# Patient Record
Sex: Female | Born: 1940 | Race: Black or African American | Hispanic: No | Smoking: Former smoker
Health system: Southern US, Community
[De-identification: ages and names within clinical notes are randomized; demographics above are authoritative.]

## PROBLEM LIST (undated history)

## (undated) DIAGNOSIS — Z8249 Family history of ischemic heart disease and other diseases of the circulatory system: Secondary | ICD-10-CM

## (undated) DIAGNOSIS — C50912 Malignant neoplasm of unspecified site of left female breast: Secondary | ICD-10-CM

## (undated) DIAGNOSIS — K219 Gastro-esophageal reflux disease without esophagitis: Secondary | ICD-10-CM

## (undated) DIAGNOSIS — I10 Essential (primary) hypertension: Secondary | ICD-10-CM

## (undated) DIAGNOSIS — Z853 Personal history of malignant neoplasm of breast: Secondary | ICD-10-CM

## (undated) DIAGNOSIS — C50919 Malignant neoplasm of unspecified site of unspecified female breast: Secondary | ICD-10-CM

## (undated) DIAGNOSIS — I509 Heart failure, unspecified: Secondary | ICD-10-CM

## (undated) HISTORY — DX: Family history of ischemic heart disease and other diseases of the circulatory system: Z82.49

## (undated) HISTORY — DX: Malignant neoplasm of unspecified site of left female breast: C50.912

## (undated) HISTORY — DX: Gastro-esophageal reflux disease without esophagitis: K21.9

## (undated) HISTORY — DX: Malignant neoplasm of unspecified site of unspecified female breast: C50.919

## (undated) HISTORY — DX: Essential (primary) hypertension: I10

## (undated) HISTORY — DX: Personal history of malignant neoplasm of breast: Z85.3

## (undated) HISTORY — DX: Heart failure, unspecified: I50.9

---

## 1969-07-20 HISTORY — PX: TOTAL ABDOMINAL HYSTERECTOMY W/ BILATERAL SALPINGOOPHORECTOMY: SHX83

## 1969-07-20 HISTORY — PX: VESICOVAGINAL FISTULA CLOSURE W/ TAH: SUR271

## 1971-07-21 HISTORY — PX: ABDOMINAL HYSTERECTOMY: SHX81

## 1995-07-21 HISTORY — PX: MASTECTOMY: SHX3

## 1996-07-20 DIAGNOSIS — I10 Essential (primary) hypertension: Secondary | ICD-10-CM

## 1996-07-20 DIAGNOSIS — C50919 Malignant neoplasm of unspecified site of unspecified female breast: Secondary | ICD-10-CM

## 1996-07-20 HISTORY — DX: Malignant neoplasm of unspecified site of unspecified female breast: C50.919

## 1996-07-20 HISTORY — DX: Essential (primary) hypertension: I10

## 1996-07-20 HISTORY — PX: BREAST SURGERY: SHX581

## 2000-03-20 LAB — HM COLONOSCOPY: HM Colonoscopy: ABNORMAL

## 2001-01-14 ENCOUNTER — Encounter: Payer: Self-pay | Admitting: Family Medicine

## 2001-01-14 ENCOUNTER — Ambulatory Visit (HOSPITAL_COMMUNITY): Admission: RE | Admit: 2001-01-14 | Discharge: 2001-01-14 | Payer: Self-pay | Admitting: Family Medicine

## 2001-09-27 ENCOUNTER — Encounter (HOSPITAL_COMMUNITY): Admission: RE | Admit: 2001-09-27 | Discharge: 2001-10-27 | Payer: Self-pay | Admitting: Oncology

## 2001-09-28 ENCOUNTER — Ambulatory Visit (HOSPITAL_COMMUNITY): Admission: RE | Admit: 2001-09-28 | Discharge: 2001-09-28 | Payer: Self-pay | Admitting: Family Medicine

## 2001-09-28 ENCOUNTER — Encounter: Payer: Self-pay | Admitting: Family Medicine

## 2002-12-25 ENCOUNTER — Encounter: Payer: Self-pay | Admitting: Family Medicine

## 2002-12-25 ENCOUNTER — Ambulatory Visit (HOSPITAL_COMMUNITY): Admission: RE | Admit: 2002-12-25 | Discharge: 2002-12-25 | Payer: Self-pay | Admitting: Family Medicine

## 2003-01-17 ENCOUNTER — Other Ambulatory Visit: Admission: RE | Admit: 2003-01-17 | Discharge: 2003-01-17 | Payer: Self-pay | Admitting: General Surgery

## 2003-10-08 ENCOUNTER — Ambulatory Visit (HOSPITAL_COMMUNITY): Admission: RE | Admit: 2003-10-08 | Discharge: 2003-10-08 | Payer: Self-pay | Admitting: Family Medicine

## 2004-02-07 ENCOUNTER — Encounter (HOSPITAL_COMMUNITY): Admission: RE | Admit: 2004-02-07 | Discharge: 2004-03-08 | Payer: Self-pay | Admitting: Oncology

## 2004-03-20 ENCOUNTER — Encounter: Payer: Self-pay | Admitting: Family Medicine

## 2004-03-20 LAB — CONVERTED CEMR LAB: Pap Smear: NORMAL

## 2004-03-28 ENCOUNTER — Encounter (HOSPITAL_COMMUNITY): Admission: RE | Admit: 2004-03-28 | Discharge: 2004-04-18 | Payer: Self-pay | Admitting: Oncology

## 2004-03-28 ENCOUNTER — Encounter: Admission: RE | Admit: 2004-03-28 | Discharge: 2004-04-18 | Payer: Self-pay | Admitting: Oncology

## 2004-05-22 ENCOUNTER — Emergency Department (HOSPITAL_COMMUNITY): Admission: EM | Admit: 2004-05-22 | Discharge: 2004-05-22 | Payer: Self-pay | Admitting: Emergency Medicine

## 2004-05-28 ENCOUNTER — Ambulatory Visit: Payer: Self-pay | Admitting: Family Medicine

## 2004-05-30 ENCOUNTER — Ambulatory Visit (HOSPITAL_COMMUNITY): Admission: RE | Admit: 2004-05-30 | Discharge: 2004-05-30 | Payer: Self-pay | Admitting: Family Medicine

## 2005-02-19 ENCOUNTER — Inpatient Hospital Stay: Payer: Self-pay | Admitting: Internal Medicine

## 2005-03-18 ENCOUNTER — Ambulatory Visit: Payer: Self-pay | Admitting: Family Medicine

## 2005-03-26 ENCOUNTER — Encounter (HOSPITAL_COMMUNITY): Admission: RE | Admit: 2005-03-26 | Discharge: 2005-04-17 | Payer: Self-pay | Admitting: Oncology

## 2005-03-26 ENCOUNTER — Encounter: Admission: RE | Admit: 2005-03-26 | Discharge: 2005-04-17 | Payer: Self-pay | Admitting: Oncology

## 2005-03-30 ENCOUNTER — Ambulatory Visit (HOSPITAL_COMMUNITY): Payer: Self-pay | Admitting: Oncology

## 2005-04-08 ENCOUNTER — Ambulatory Visit: Payer: Self-pay | Admitting: Family Medicine

## 2005-04-13 ENCOUNTER — Ambulatory Visit (HOSPITAL_COMMUNITY): Admission: RE | Admit: 2005-04-13 | Discharge: 2005-04-13 | Payer: Self-pay | Admitting: Family Medicine

## 2005-05-07 ENCOUNTER — Ambulatory Visit: Payer: Self-pay | Admitting: Internal Medicine

## 2005-05-12 ENCOUNTER — Encounter (HOSPITAL_COMMUNITY): Admission: RE | Admit: 2005-05-12 | Discharge: 2005-06-11 | Payer: Self-pay | Admitting: *Deleted

## 2005-08-27 ENCOUNTER — Ambulatory Visit: Payer: Self-pay | Admitting: Family Medicine

## 2005-10-01 ENCOUNTER — Ambulatory Visit: Payer: Self-pay | Admitting: Family Medicine

## 2006-02-09 ENCOUNTER — Ambulatory Visit: Payer: Self-pay | Admitting: Family Medicine

## 2006-04-20 ENCOUNTER — Encounter: Admission: RE | Admit: 2006-04-20 | Discharge: 2006-04-20 | Payer: Self-pay | Admitting: Oncology

## 2006-04-20 ENCOUNTER — Encounter (HOSPITAL_COMMUNITY): Admission: RE | Admit: 2006-04-20 | Discharge: 2006-05-20 | Payer: Self-pay | Admitting: Oncology

## 2006-05-06 ENCOUNTER — Ambulatory Visit: Payer: Self-pay | Admitting: Family Medicine

## 2006-09-21 ENCOUNTER — Ambulatory Visit: Payer: Self-pay | Admitting: Family Medicine

## 2006-10-13 ENCOUNTER — Ambulatory Visit (HOSPITAL_COMMUNITY): Admission: RE | Admit: 2006-10-13 | Discharge: 2006-10-13 | Payer: Self-pay | Admitting: Family Medicine

## 2006-10-13 ENCOUNTER — Encounter: Payer: Self-pay | Admitting: Family Medicine

## 2006-10-13 LAB — CONVERTED CEMR LAB
ALT: 10 units/L (ref 0–35)
AST: 14 units/L (ref 0–37)
Albumin: 4 g/dL (ref 3.5–5.2)
Alkaline Phosphatase: 82 units/L (ref 39–117)
BUN: 19 mg/dL (ref 6–23)
Basophils Absolute: 0 10*3/uL (ref 0.0–0.1)
Basophils Relative: 0 % (ref 0–1)
Bilirubin, Direct: <0.1 mg/dL (ref 0.0–0.3)
CO2: 23 meq/L (ref 19–32)
Calcium: 9 mg/dL (ref 8.4–10.5)
Chloride: 102 meq/L (ref 96–112)
Cholesterol: 189 mg/dL (ref 0–200)
Creatinine, Ser: 0.93 mg/dL (ref 0.40–1.20)
Eosinophils Absolute: 0.2 10*3/uL (ref 0.0–0.7)
Eosinophils Relative: 2 % (ref 0–5)
Glucose, Bld: 68 mg/dL — ABNORMAL LOW (ref 70–99)
HCT: 41.3 % (ref 36.0–46.0)
HDL: 44 mg/dL (ref >39)
Hemoglobin: 12.9 g/dL (ref 12.0–15.0)
LDL Cholesterol: 129 mg/dL — ABNORMAL HIGH (ref 0–99)
Lymphocytes Relative: 33 % (ref 12–46)
Lymphs Abs: 2.7 10*3/uL (ref 0.7–3.3)
MCHC: 31.2 g/dL (ref 30.0–36.0)
MCV: 93.2 fL (ref 78.0–100.0)
Monocytes Absolute: 0.5 10*3/uL (ref 0.2–0.7)
Monocytes Relative: 6 % (ref 3–11)
Neutro Abs: 4.7 10*3/uL (ref 1.7–7.7)
Neutrophils Relative %: 58 % (ref 43–77)
Platelets: 383 10*3/uL (ref 150–400)
Potassium: 4.1 meq/L (ref 3.5–5.3)
RBC: 4.43 M/uL (ref 3.87–5.11)
RDW: 14.3 % — ABNORMAL HIGH (ref 11.5–14.0)
Sodium: 141 meq/L (ref 135–145)
TSH: 4.108 microintl units/mL (ref 0.350–5.50)
Total Bilirubin: 0.4 mg/dL (ref 0.3–1.2)
Total CHOL/HDL Ratio: 4.3
Total Protein: 7.6 g/dL (ref 6.0–8.3)
Triglycerides: 80 mg/dL (ref <150)
VLDL: 16 mg/dL (ref 0–40)
WBC: 8.2 10*3/uL (ref 4.0–10.5)

## 2006-10-27 ENCOUNTER — Ambulatory Visit: Payer: Self-pay | Admitting: Family Medicine

## 2007-02-14 ENCOUNTER — Ambulatory Visit: Payer: Self-pay | Admitting: Family Medicine

## 2007-02-15 ENCOUNTER — Encounter: Payer: Self-pay | Admitting: Family Medicine

## 2007-04-18 ENCOUNTER — Ambulatory Visit: Payer: Self-pay | Admitting: Family Medicine

## 2007-04-25 ENCOUNTER — Ambulatory Visit (HOSPITAL_COMMUNITY): Admission: RE | Admit: 2007-04-25 | Discharge: 2007-04-25 | Payer: Self-pay | Admitting: Family Medicine

## 2007-04-27 ENCOUNTER — Ambulatory Visit (HOSPITAL_COMMUNITY): Payer: Self-pay | Admitting: Oncology

## 2007-05-25 ENCOUNTER — Ambulatory Visit: Payer: Self-pay | Admitting: Family Medicine

## 2007-05-25 LAB — CONVERTED CEMR LAB
BUN: 18 mg/dL (ref 6–23)
CO2: 27 meq/L (ref 19–32)
Calcium: 9 mg/dL (ref 8.4–10.5)
Chloride: 104 meq/L (ref 96–112)
Cholesterol: 189 mg/dL (ref 0–200)
Creatinine, Ser: 0.93 mg/dL (ref 0.40–1.20)
Glucose, Bld: 90 mg/dL (ref 70–99)
HDL: 42 mg/dL (ref >39)
LDL Cholesterol: 134 mg/dL — ABNORMAL HIGH (ref 0–99)
Potassium: 3.9 meq/L (ref 3.5–5.3)
Sodium: 141 meq/L (ref 135–145)
Total CHOL/HDL Ratio: 4.5
Triglycerides: 67 mg/dL (ref <150)
VLDL: 13 mg/dL (ref 0–40)

## 2007-07-29 ENCOUNTER — Encounter: Payer: Self-pay | Admitting: Family Medicine

## 2007-07-29 DIAGNOSIS — I10 Essential (primary) hypertension: Secondary | ICD-10-CM | POA: Insufficient documentation

## 2007-07-29 DIAGNOSIS — J45909 Unspecified asthma, uncomplicated: Secondary | ICD-10-CM | POA: Insufficient documentation

## 2007-07-29 DIAGNOSIS — I1A0 Resistant hypertension: Secondary | ICD-10-CM | POA: Insufficient documentation

## 2007-07-29 DIAGNOSIS — C50912 Malignant neoplasm of unspecified site of left female breast: Secondary | ICD-10-CM

## 2007-07-29 DIAGNOSIS — I509 Heart failure, unspecified: Secondary | ICD-10-CM | POA: Insufficient documentation

## 2007-07-29 DIAGNOSIS — K219 Gastro-esophageal reflux disease without esophagitis: Secondary | ICD-10-CM | POA: Insufficient documentation

## 2007-07-29 HISTORY — DX: Malignant neoplasm of unspecified site of left female breast: C50.912

## 2007-10-06 ENCOUNTER — Ambulatory Visit: Payer: Self-pay | Admitting: Family Medicine

## 2007-11-17 ENCOUNTER — Other Ambulatory Visit: Admission: RE | Admit: 2007-11-17 | Discharge: 2007-11-17 | Payer: Self-pay | Admitting: Family Medicine

## 2007-11-17 ENCOUNTER — Ambulatory Visit: Payer: Self-pay | Admitting: Family Medicine

## 2007-11-17 ENCOUNTER — Encounter: Payer: Self-pay | Admitting: Family Medicine

## 2007-11-17 LAB — CONVERTED CEMR LAB
BUN: 15 mg/dL (ref 6–23)
CO2: 28 meq/L (ref 19–32)
Calcium: 9.1 mg/dL (ref 8.4–10.5)
Chloride: 100 meq/L (ref 96–112)
Cholesterol: 182 mg/dL (ref 0–200)
Creatinine, Ser: 0.97 mg/dL (ref 0.40–1.20)
Glucose, Bld: 86 mg/dL (ref 70–99)
HDL: 44 mg/dL (ref >39)
LDL Cholesterol: 122 mg/dL — ABNORMAL HIGH (ref 0–99)
Potassium: 3.9 meq/L (ref 3.5–5.3)
Sodium: 139 meq/L (ref 135–145)
Total CHOL/HDL Ratio: 4.1
Triglycerides: 80 mg/dL (ref <150)
VLDL: 16 mg/dL (ref 0–40)

## 2007-11-18 ENCOUNTER — Encounter: Payer: Self-pay | Admitting: Family Medicine

## 2007-12-29 ENCOUNTER — Ambulatory Visit: Payer: Self-pay | Admitting: Family Medicine

## 2008-02-09 ENCOUNTER — Ambulatory Visit: Payer: Self-pay | Admitting: Family Medicine

## 2008-02-20 ENCOUNTER — Encounter: Payer: Self-pay | Admitting: Family Medicine

## 2008-04-26 ENCOUNTER — Ambulatory Visit (HOSPITAL_COMMUNITY): Admission: RE | Admit: 2008-04-26 | Discharge: 2008-04-26 | Payer: Self-pay | Admitting: Family Medicine

## 2008-05-17 ENCOUNTER — Ambulatory Visit: Payer: Self-pay | Admitting: Family Medicine

## 2008-06-07 ENCOUNTER — Telehealth: Payer: Self-pay | Admitting: Family Medicine

## 2008-06-29 ENCOUNTER — Ambulatory Visit (HOSPITAL_COMMUNITY): Payer: Self-pay | Admitting: Oncology

## 2008-06-29 ENCOUNTER — Encounter: Payer: Self-pay | Admitting: Family Medicine

## 2008-09-20 ENCOUNTER — Ambulatory Visit: Payer: Self-pay | Admitting: Family Medicine

## 2008-09-20 DIAGNOSIS — R5381 Other malaise: Secondary | ICD-10-CM

## 2008-09-20 DIAGNOSIS — R5383 Other fatigue: Secondary | ICD-10-CM

## 2008-09-20 HISTORY — DX: Other malaise: R53.81

## 2008-09-21 ENCOUNTER — Encounter: Payer: Self-pay | Admitting: Family Medicine

## 2008-09-25 ENCOUNTER — Encounter: Payer: Self-pay | Admitting: Family Medicine

## 2008-09-26 ENCOUNTER — Encounter: Payer: Self-pay | Admitting: Family Medicine

## 2008-09-27 LAB — CONVERTED CEMR LAB
BUN: 20 mg/dL (ref 6–23)
Band Neutrophils: 0 % (ref 0–10)
Basophils Absolute: 0 10*3/uL (ref 0.0–0.1)
Basophils Relative: 0 % (ref 0–1)
CO2: 27 meq/L (ref 19–32)
Calcium: 9.1 mg/dL (ref 8.4–10.5)
Chloride: 102 meq/L (ref 96–112)
Cholesterol: 193 mg/dL (ref 0–200)
Creatinine, Ser: 0.9 mg/dL (ref 0.40–1.20)
Eosinophils Absolute: 0.1 10*3/uL (ref 0.0–0.7)
Eosinophils Relative: 1 % (ref 0–5)
Glucose, Bld: 81 mg/dL (ref 70–99)
HCT: 39.1 % (ref 36.0–46.0)
HDL: 46 mg/dL (ref >39)
Hemoglobin: 12.2 g/dL (ref 12.0–15.0)
LDL Cholesterol: 134 mg/dL — ABNORMAL HIGH (ref 0–99)
Lymphocytes Relative: 27 % (ref 12–46)
Lymphs Abs: 2.4 10*3/uL (ref 0.7–4.0)
MCHC: 31.2 g/dL (ref 30.0–36.0)
MCV: 94.4 fL (ref 78.0–100.0)
Monocytes Absolute: 0.5 10*3/uL (ref 0.1–1.0)
Monocytes Relative: 5 % (ref 3–12)
Neutro Abs: 5.9 10*3/uL (ref 1.7–7.7)
Neutrophils Relative %: 67 % (ref 43–77)
Platelets: 381 10*3/uL (ref 150–400)
Potassium: 4.2 meq/L (ref 3.5–5.3)
RBC: 4.14 M/uL (ref 3.87–5.11)
RDW: 15.1 % (ref 11.5–15.5)
Sodium: 142 meq/L (ref 135–145)
TSH: 2.729 microintl units/mL (ref 0.350–4.500)
Total CHOL/HDL Ratio: 4.2
Triglycerides: 63 mg/dL (ref <150)
VLDL: 13 mg/dL (ref 0–40)
WBC: 8.8 10*3/uL (ref 4.0–10.5)

## 2008-10-22 ENCOUNTER — Encounter: Payer: Self-pay | Admitting: Family Medicine

## 2008-11-22 ENCOUNTER — Ambulatory Visit: Payer: Self-pay | Admitting: Family Medicine

## 2009-04-18 ENCOUNTER — Ambulatory Visit: Payer: Self-pay | Admitting: Family Medicine

## 2009-04-29 ENCOUNTER — Ambulatory Visit (HOSPITAL_COMMUNITY): Admission: RE | Admit: 2009-04-29 | Discharge: 2009-04-29 | Payer: Self-pay | Admitting: Oncology

## 2009-04-29 ENCOUNTER — Encounter: Payer: Self-pay | Admitting: Family Medicine

## 2009-05-06 ENCOUNTER — Ambulatory Visit: Payer: Self-pay | Admitting: Family Medicine

## 2009-05-06 ENCOUNTER — Other Ambulatory Visit: Admission: RE | Admit: 2009-05-06 | Discharge: 2009-05-06 | Payer: Self-pay | Admitting: Family Medicine

## 2009-05-06 ENCOUNTER — Encounter: Payer: Self-pay | Admitting: Family Medicine

## 2009-05-06 LAB — CONVERTED CEMR LAB: Creatinine, Ser: 0.99 mg/dL (ref 0.40–1.20)

## 2009-05-23 ENCOUNTER — Telehealth: Payer: Self-pay | Admitting: Family Medicine

## 2009-08-19 ENCOUNTER — Ambulatory Visit (HOSPITAL_COMMUNITY): Payer: Self-pay | Admitting: Oncology

## 2009-08-19 ENCOUNTER — Encounter: Payer: Self-pay | Admitting: Family Medicine

## 2009-10-21 ENCOUNTER — Ambulatory Visit: Payer: Self-pay | Admitting: Family Medicine

## 2009-10-23 ENCOUNTER — Encounter: Payer: Self-pay | Admitting: Family Medicine

## 2009-10-28 ENCOUNTER — Encounter: Payer: Self-pay | Admitting: Family Medicine

## 2009-10-28 LAB — CONVERTED CEMR LAB
BUN: 16 mg/dL (ref 6–23)
Basophils Absolute: 0 10*3/uL (ref 0.0–0.1)
Basophils Relative: 0 % (ref 0–1)
CO2: 27 meq/L (ref 19–32)
Calcium: 9.1 mg/dL (ref 8.4–10.5)
Chloride: 101 meq/L (ref 96–112)
Cholesterol: 185 mg/dL (ref 0–200)
Creatinine, Ser: 0.95 mg/dL (ref 0.40–1.20)
Eosinophils Absolute: 0.2 10*3/uL (ref 0.0–0.7)
Eosinophils Relative: 2 % (ref 0–5)
Glucose, Bld: 86 mg/dL (ref 70–99)
HCT: 39.1 % (ref 36.0–46.0)
HDL: 45 mg/dL (ref >39)
Hemoglobin: 12.3 g/dL (ref 12.0–15.0)
LDL Cholesterol: 123 mg/dL — ABNORMAL HIGH (ref 0–99)
Lymphocytes Relative: 35 % (ref 12–46)
Lymphs Abs: 3.2 10*3/uL (ref 0.7–4.0)
MCHC: 31.5 g/dL (ref 30.0–36.0)
MCV: 93.1 fL (ref 78.0–100.0)
Monocytes Absolute: 0.6 10*3/uL (ref 0.1–1.0)
Monocytes Relative: 6 % (ref 3–12)
Neutro Abs: 5.3 10*3/uL (ref 1.7–7.7)
Neutrophils Relative %: 57 % (ref 43–77)
Platelets: 382 10*3/uL (ref 150–400)
Potassium: 3.8 meq/L (ref 3.5–5.3)
RBC: 4.2 M/uL (ref 3.87–5.11)
RDW: 14.9 % (ref 11.5–15.5)
Sodium: 140 meq/L (ref 135–145)
TSH: 4.412 microintl units/mL (ref 0.350–4.500)
Total CHOL/HDL Ratio: 4.1
Triglycerides: 87 mg/dL (ref <150)
VLDL: 17 mg/dL (ref 0–40)
Vit D, 25-Hydroxy: 10 ng/mL — ABNORMAL LOW (ref 30–89)
WBC: 9.3 10*3/uL (ref 4.0–10.5)

## 2009-11-12 ENCOUNTER — Encounter: Payer: Self-pay | Admitting: Gastroenterology

## 2009-12-09 ENCOUNTER — Emergency Department (HOSPITAL_COMMUNITY): Admission: EM | Admit: 2009-12-09 | Discharge: 2009-12-09 | Payer: Self-pay | Admitting: Emergency Medicine

## 2010-03-27 ENCOUNTER — Ambulatory Visit: Payer: Self-pay | Admitting: Family Medicine

## 2010-04-07 ENCOUNTER — Encounter (INDEPENDENT_AMBULATORY_CARE_PROVIDER_SITE_OTHER): Payer: Self-pay

## 2010-04-17 ENCOUNTER — Telehealth (INDEPENDENT_AMBULATORY_CARE_PROVIDER_SITE_OTHER): Payer: Self-pay

## 2010-04-22 ENCOUNTER — Encounter: Payer: Self-pay | Admitting: Gastroenterology

## 2010-05-01 ENCOUNTER — Ambulatory Visit (HOSPITAL_COMMUNITY): Admission: RE | Admit: 2010-05-01 | Discharge: 2010-05-01 | Payer: Self-pay | Admitting: Family Medicine

## 2010-05-20 LAB — CONVERTED CEMR LAB
BUN: 21 mg/dL (ref 6–23)
CO2: 31 meq/L (ref 19–32)
Calcium: 9.1 mg/dL (ref 8.4–10.5)
Chloride: 102 meq/L (ref 96–112)
Cholesterol: 198 mg/dL (ref 0–200)
Creatinine, Ser: 0.91 mg/dL (ref 0.40–1.20)
Glucose, Bld: 100 mg/dL — ABNORMAL HIGH (ref 70–99)
HDL: 46 mg/dL (ref >39)
LDL Cholesterol: 136 mg/dL — ABNORMAL HIGH (ref 0–99)
Potassium: 3.9 meq/L (ref 3.5–5.3)
Sodium: 140 meq/L (ref 135–145)
Total CHOL/HDL Ratio: 4.3
Triglycerides: 82 mg/dL (ref <150)
VLDL: 16 mg/dL (ref 0–40)
Vit D, 25-Hydroxy: 37 ng/mL (ref 30–89)

## 2010-05-24 ENCOUNTER — Observation Stay: Payer: Self-pay | Admitting: Internal Medicine

## 2010-07-09 ENCOUNTER — Encounter: Payer: Self-pay | Admitting: Family Medicine

## 2010-07-09 ENCOUNTER — Encounter (HOSPITAL_COMMUNITY)
Admission: RE | Admit: 2010-07-09 | Discharge: 2010-08-08 | Payer: Self-pay | Source: Home / Self Care | Attending: Oncology | Admitting: Oncology

## 2010-07-09 ENCOUNTER — Ambulatory Visit (HOSPITAL_COMMUNITY): Payer: Self-pay | Admitting: Oncology

## 2010-08-10 ENCOUNTER — Encounter: Payer: Self-pay | Admitting: Family Medicine

## 2010-08-19 NOTE — Assessment & Plan Note (Signed)
Summary: physical   Vital Signs:  Patient profile:   70 year old female Menstrual status:  hysterectomy Height:      63 inches Weight:      244.50 pounds BMI:     43.47 O2 Sat:      93 % Pulse rate:   59 / minute Pulse rhythm:   regular Resp:     16 per minute BP sitting:   120 / 80  (left arm) Cuff size:   xl  Vitals Entered By: Everitt Amber (May 06, 2009 10:38 AM)  Nutrition Counseling: Patient's BMI is greater than 25 and therefore counseled on weight management options. CC: CPE Is Patient Diabetic? No Pain Assessment Patient in pain? no        CC:  CPE.  History of Present Illness: Reports  that she has been doing well. Denies recent fever or chills. Denies sinus pressure, nasal congestion , ear pain or sore throat. Denies chest congestion, or cough productive of sputum. Denies chest pain, palpitations, PND, orthopnea or leg swelling. Denies abdominal pain, nausea, vomitting, diarrhea or constipation. Denies change in bowel movements or bloody stool. Denies dysuria , frequency, incontinence or hesitancy. Reports  joint pain, and  reduced mobility.primarily of the spine Denies headaches, vertigo, seizures. Denies depression, anxiety or insomnia. Denies  rash, lesions, or itch.     Current Medications (verified): 1)  Hyzaar 100-25 Mg  Tabs (Losartan Potassium-Hctz) .... Once Daily 2)  Potassium Chloride 20 Meq  Pack (Potassium Chloride) .... Once Daily 3)  Multivitamins  Tabs (Multiple Vitamin) .... Take One Tab By Mouth Once Daily  Allergies (verified): 1)  ! Asa  Review of Systems      See HPI Neuro:  Denies headaches, poor balance, seizures, and sensation of room spinning. Endo:  Denies cold intolerance, excessive hunger, excessive thirst, excessive urination, heat intolerance, polyuria, and weight change. Heme:  Denies abnormal bruising and bleeding. Allergy:  Complains of seasonal allergies; mild symptoms.  Physical Exam  General:  alert,  well-hydrated, and overweight-appearing.   Head:  Normocephalic and atraumatic without obvious abnormalities. No apparent alopecia or balding. Eyes:  No corneal or conjunctival inflammation noted. EOMI. Perrla. Funduscopic exam benign, without hemorrhages, exudates or papilledema. Vision grossly normal. Ears:  External ear exam shows no significant lesions or deformities.  Otoscopic examination reveals clear canals, tympanic membranes are intact bilaterally without bulging, retraction, inflammation or discharge. Hearing is grossly normal bilaterally. Nose:  External nasal examination shows no deformity or inflammation. Nasal mucosa are pink and moist without lesions or exudates. Mouth:  pharynx pink and moist and teeth missing.   Neck:  No deformities, masses, or tenderness noted. Chest Wall:  No deformities, masses, or tenderness noted. Breasts:  left mastectomy, right breast negative for mass, no axillary orsupraclavicular adenopathy Lungs:  Normal respiratory effort, chest expands symmetrically. Lungs are clear to auscultation, no crackles or wheezes. Heart:  Normal rate and regular rhythm. S1 and S2 normal without gallop, murmur, click, rub or other extra sounds. Abdomen:  Bowel sounds positive,abdomen soft and non-tender without masses, organomegaly or hernias noted. Rectal:  No external abnormalities noted. Normal sphincter tone. No rectal masses or tenderness. Genitalia:  normal introitus, no vaginal discharge, no vaginal atrophy, and no adnexal masses or tenderness.  terus is absent Msk:  No deformity or scoliosis noted of thoracic or lumbar spine.   Pulses:  R and L carotid,radial,femoral,dorsalis pedis and posterior tibial pulses are full and equal bilaterally Extremities:  decreased ROM thoracolumbar spine  adequate in hips , decreased in knees Neurologic:  No cranial nerve deficits noted. Station and gait are normal. Plantar reflexes are down-going bilaterally. DTRs are symmetrical  throughout. Sensory, motor and coordinative functions appear intact. Skin:  Intact without suspicious lesions or rashes Cervical Nodes:  No lymphadenopathy noted Axillary Nodes:  No palpable lymphadenopathy Inguinal Nodes:  No significant adenopathy Psych:  Cognition and judgment appear intact. Alert and cooperative with normal attention span and concentration. No apparent delusions, illusions, hallucinations   Impression & Recommendations:  Problem # 1:  PHYSICAL EXAMINATION (ICD-V70.0) Assessment Comment Only pap sent, stool is guaic negative  Problem # 2:  MORBID OBESITY (ICD-278.01) Assessment: Improved  Ht: 63 (05/06/2009)   Wt: 244.50 (05/06/2009)   BMI: 43.47 (05/06/2009)  Problem # 3:  HYPERTENSION (ICD-401.9) Assessment: Unchanged  Her updated medication list for this problem includes:    Hyzaar 100-25 Mg Tabs (Losartan potassium-hctz) ..... Once daily  Orders: Creatinine-[facility] ([id number])  BP today: 120/80 Prior BP: 120/80 (04/18/2009)  Prior 10 Yr Risk Heart Disease: 15 % (11/22/2008)  Labs Reviewed: K+: 4.2 (09/21/2008) Creat: : 0.90 (09/21/2008)   Chol: 193 (09/21/2008)   HDL: 46 (09/21/2008)   LDL: 134 (09/21/2008)   TG: 63 (09/21/2008)  Problem # 4:  ASTHMA (ICD-493.90) Assessment: Improved  Complete Medication List: 1)  Hyzaar 100-25 Mg Tabs (Losartan potassium-hctz) .... Once daily 2)  Potassium Chloride 20 Meq Pack (Potassium chloride) .... Once daily 3)  Multivitamins Tabs (Multiple vitamin) .... Take one tab by mouth once daily  Other Orders: Influenza Vaccine NON MCR (57846) Hemoccult Guaiac-1 spec.(in office) (82270) Pap Smear ([address])  Patient Instructions: 1)  Please schedule a follow-up appointment in 4 months. 2)  It is important that you exercise regularly at least 20 minutes 5 times a week. If you develop chest pain, have severe difficulty breathing, or feel very tired , stop exercising immediately and seek medical attention. 3)  You  need to lose weight. Consider a lower calorie diet and regular exercise.Congrats on weight lost 4)  creatinine today  5)  no med changes   Immunizations Administered:  Influenza Vaccine # 1:    Vaccine Type: Fluvax Non-MCR    Site: right deltoid    Mfr: novartis    Dose: 0.5 ml    Route: IM    Given by: Worthy Keeler LPN    Exp. Date: 04/11    Lot #: [id number]    VIS given: 02/10/07 version given May 06, 2009.   Laboratory Results  Date/Time Received: 05/06/09 Date/Time Reported: 05/06/09  Stool - Occult Blood Hemmoccult #1: negative Date: 05/06/2009 Comments: [id number] 9R 8/11 118 10/12

## 2010-08-19 NOTE — Letter (Signed)
Summary: Letter  Letter   Imported By: Lind Guest 09/25/2008 16:02:06  _____________________________________________________________________  External Attachment:    Type:   Image     Comment:   External Document

## 2010-08-19 NOTE — Letter (Signed)
Summary: Letter  Letter   Imported By: Lind Guest 10/23/2009 15:08:36  _____________________________________________________________________  External Attachment:    Type:   Image     Comment:   External Document

## 2010-08-19 NOTE — Progress Notes (Signed)
  Phone Note Other Incoming   Caller: [facility] Details for Reason: pa for mRI breast Summary of Call: pls let them know the pt has a left mastectomy, no breast reconstruction or implants but a mastectomy, if this will do pls have them cover the mri , this is the additional info, paper from them is in your box Initial call taken by: Syliva Overman MD,  May 23, 2009 5:14 PM  Follow-up for Phone Call        trying to get precert on pt.   Additional Follow-up for Phone Call Additional follow up Details #1::        noted, pls let pt know also Additional Follow-up by: Syliva Overman MD,  June 04, 2009 12:17 PM    Additional Follow-up for Phone Call Additional follow up Details #2::    need to get where she has had a mammo Follow-up by: Rudene Anda,  June 20, 2009 5:08 PM

## 2010-08-19 NOTE — Letter (Signed)
Summary: HANDICAPP CARD  HANDICAPP CARD   Imported By: Lind Guest 05/06/2009 16:33:32  _____________________________________________________________________  External Attachment:    Type:   Image     Comment:   External Document

## 2010-08-19 NOTE — Letter (Signed)
Summary: Recall Colonoscopy/Endoscopy, Change to Office Visit  [facility]  [address]   [place], [place] [address]   Phone: [phone]  Fax: [phone]      April 07, 2010   Heather [name] [address] [place], [place]  [address] 05/29/1941   Dear [name],   According to our records, it is time for you to schedule a Colonoscopy. Please call and ask to speak to the nurse to get triaged and scheduled. Please do not neglect your health.    Sincerely,   Cloria Spring LPN  [facility] Ph: [phone]   Fax: [phone]

## 2010-08-19 NOTE — Assessment & Plan Note (Signed)
Summary: FOLLOW UP   Vital Signs:  Patient profile:   70 year old female Menstrual status:  hysterectomy Height:      63 inches (160.02 cm) Weight:      251 pounds (114.09 kg) BMI:     44.62 O2 Sat:      93 % Pulse rate:   59 / minute Pulse rhythm:   regular Resp:     16 per minute BP sitting:   128 / 90  (right arm)  Vitals Entered By: Everitt Amber (Nov 22, 2008 10:41 AM)  Nutrition Counseling: Patient's BMI is greater than 25 and therefore counseled on weight management options. CC: Follow up chronic problems, Hypertension Management  years   days  Menstrual Status hysterectomy Last PAP Result Normal   CC:  Follow up chronic problems and Hypertension Management.  History of Present Illness: Patient reprts 2 episodes of her throat closing, not sure if due due meat she has eaten, requests epipen, no clear allergy established and she is refusing allergy testing, did not seek med attention for the reported symptos which was relieved with benadryl. she denies any recent fever or chills. She denies depression, anxiety or insomnia. She c/o persitent left arm pain.   Hypertension History:      Positive major cardiovascular risk factors include female age 73 years old or older and hypertension.  Negative major cardiovascular risk factors include non-tobacco-user status.        Positive history for target organ damage include cardiac end organ damage (either CHF or LVH).     Current Medications (verified): 1)  Hyzaar 100-25 Mg  Tabs (Losartan Potassium-Hctz) .... Once Daily 2)  Potassium Chloride 20 Meq  Pack (Potassium Chloride) .... Once Daily 3)  Multivitamins  Tabs (Multiple Vitamin) .... Take One Tab By Mouth Once Daily  Allergies (verified): 1)  ! Asa  Review of Systems General:  Denies chills and fever. CV:  Denies chest pain or discomfort, palpitations, and swelling of feet. Resp:  Denies cough, sputum productive, and wheezing; asthma stable at this tme. GI:  Denies  abdominal pain, constipation, diarrhea, nausea, and vomiting. GU:  Denies dysuria and urinary frequency. MS:  Complains of muscle aches; left arm pain upper outer hurts ever since she got a shot in it about 2 years ago, this is aggravated by cold temp, alcohol pad relieves the pain. Derm:  Denies itching, lesion(s), and rash. Psych:  Denies anxiety and depression. Endo:  Denies cold intolerance, excessive hunger, excessive thirst, excessive urination, heat intolerance, polyuria, and weight change. Allergy:  Complains of itching eyes, seasonal allergies, and sneezing; satisfied with otc meds as needed at this time.  Physical Exam  General:  alert, well-hydrated, and overweight-appearing.  HEENT: No facial asymmetry,  EOMI, No sinus tenderness, TM's Clear, oropharynx  pink and moist.   Chest: Clear to auscultation bilaterally.  CVS: S1, S2, No murmurs, No S3.   Abd: Soft, Nontender.  MS: Adequate ROM spine, hips, shoulders and knees.  Ext: No edema.   CNS: CN 2-12 intact, power tone and sensation normal throughout.   Skin: Intact, no visible lesions or rashes.  Psych: Good eye contact, normal affect.  Memory intact, not anxious or depressed appearing.     Impression & Recommendations:  Problem # 1:  MORBID OBESITY (ICD-278.01) Assessment Unchanged  Ht: 63 (11/22/2008)   Wt: 251 (11/22/2008)   BMI: 44.62 (11/22/2008)  Problem # 2:  HYPERTENSION (ICD-401.9) Assessment: Improved  The following medications were removed from  the medication list:    Norvasc 2.5 Mg Tabs (Amlodipine besylate) .Marland Kitchen... Take 1 tablet by mouth once a day Her updated medication list for this problem includes:    Hyzaar 100-25 Mg Tabs (Losartan potassium-hctz) ..... 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 Once daily 2)  Potassium Chloride 20 Meq Pack (Potassium chloride) .... Once daily 3)  Multivitamins Tabs (Multiple vitamin) .... Take one tab by mouth once daily  Hypertension Assessment/Plan:      The patient's hypertensive risk group is category C: Target organ damage and/or diabetes.  Her calculated 10 year risk of coronary heart disease is 15 %.  Today's blood pressure is 128/90.    Patient Instructions: 1)  CPE in 3 months. 2)  It is important that you exercise regularly at least 30 minutes 6 times a week. If you develop chest pain, have severe difficulty breathing, or feel very tired , stop exercising immediately and seek medical attention. 3)  You need to lose weight. Consider a lower calorie diet and regular exercise.  4)  Your bad chol has increased. 5)  You need to increase fresh or frozen fruit and vegetable intake, reduce red meats max twice weekly, increase white meat, baked, grilled or broiled eg fish, [place] breast, chicken breast. 6)  Egg white boiled is an excellent source of protein and beans cooked without butter or oil. Work towards exercising at least 5 days/week for 30 minutes each session, to improve the good cholesterol. Cut back on butter, cheese, oils and the yellow of eggs, preferably drink 1% or skimmed milk. The healthy oils are olive and canola, use in moderation; healthiest "butter" substitute is smart balance. 7)  No med changes at this time

## 2010-08-19 NOTE — Assessment & Plan Note (Signed)
Summary: office visit   Vital Signs:  Patient profile:   70 year old female Menstrual status:  hysterectomy Height:      63 inches Weight:      247.31 pounds BMI:     43.97 O2 Sat:      94 % Pulse rate:   59 / minute Pulse rhythm:   regular Resp:     16 per minute BP sitting:   120 / 80  (left arm) Cuff size:   xl  Vitals Entered By: Everitt Amber (April 18, 2009 2:39 PM)  Nutrition Counseling: Patient's BMI is greater than 25 and therefore counseled on weight management options. CC: Follow up chronic problems Is Patient Diabetic? No   CC:  Follow up chronic problems.  History of Present Illness: Heather [name] reports  that she is doing well. Denies recent fever or chills. Denies sinus pressure, nasal congestion , ear pain or sore throat. Denies chest congestion, or cough productive of sputum. Denies chest pain, palpitations, PND, orthopnea or leg swelling. Denies abdominal pain, nausea, vomitting, diarrhea or constipation. Denies change in bowel movements or bloody stool. Denies dysuria , frequency, incontinence or hesitancy. Denies  joint pain, swelling, or reduced mobility. Denies headaches, vertigo, seizures. Denies depression, anxiety or insomnia. Denies  rash, lesions, or itch. She continues to lose weight gradually with lifestyle modifications.     Current Medications (verified): 1)  Hyzaar 100-25 Mg  Tabs (Losartan Potassium-Hctz) .... Once Daily 2)  Potassium Chloride 20 Meq  Pack (Potassium Chloride) .... Once Daily 3)  Multivitamins  Tabs (Multiple Vitamin) .... Take One Tab By Mouth Once Daily  Allergies (verified): 1)  ! Asa  Review of Systems      See HPI  Physical Exam  General:  alert, well-hydrated, and overweight-appearing.  HEENT: No facial asymmetry,  EOMI, No sinus tenderness, TM's Clear, oropharynx  pink and moist.   Chest: Clear to auscultation bilaterally.  CVS: S1, S2, No murmurs, No S3.   Abd: Soft, Nontender.  MS: Adequate ROM  spine, hips, shoulders and knees.  Ext: No edema.   CNS: CN 2-12 intact, power tone and sensation normal throughout.   Skin: Intact, no visible lesions or rashes.  Psych: Good eye contact, normal affect.  Memory intact, not anxious or depressed appearing.     Impression & Recommendations:  Problem # 1:  MORBID OBESITY (ICD-278.01) Assessment Improved  Ht: 63 (04/18/2009)   Wt: 247.31 (04/18/2009)   BMI: 43.97 (04/18/2009)  Problem # 2:  GERD (ICD-530.81) Assessment: Improved  Problem # 3:  HYPERTENSION (ICD-401.9) Assessment: Improved  Her updated medication list for this problem includes:    Hyzaar 100-25 Mg Tabs (Losartan potassium-hctz) ..... Once daily  BP today: 120/80 Prior BP: 128/90 (11/22/2008)  Prior 10 Yr Risk Heart Disease: 15 % (11/22/2008)  Labs Reviewed: K+: 4.2 (09/21/2008) Creat: : 0.90 (09/21/2008)   Chol: 193 (09/21/2008)   HDL: 46 (09/21/2008)   LDL: 134 (09/21/2008)   TG: 63 (09/21/2008)  Complete Medication List: 1)  Hyzaar 100-25 Mg Tabs (Losartan potassium-hctz) .... Once daily 2)  Potassium Chloride 20 Meq Pack (Potassium chloride) .... Once daily 3)  Multivitamins Tabs (Multiple vitamin) .... Take one tab by mouth once daily  Patient Instructions: 1)  CPE in mid to end October. 2)  It is important that you exercise regularly at least 30 minutes 5 times a week. If you develop chest pain, have severe difficulty breathing, or feel very tired , stop exercising immediately and seek  medical attention. 3)  You need to lose weight. Consider a lower calorie diet and regular exercise.  4)  Your BP  is GREAT 120/80      Appended Document: office visit if she has no appt for a mamogram or MRTI breast she needs one , last in the sysatem is 10.09, pls call her and schedule if needed   Appended Document: office visit pt had a mammo in 04/29/2009 10:44. Also called breast center and faxed order to julie to schedule pt appt for MRI of breast. Raynelle Fanning will  schedule and call pt with the appt. pt notified

## 2010-08-19 NOTE — Miscellaneous (Signed)
Summary: Refill  Clinical Lists Changes  Medications: Rx of POTASSIUM CHLORIDE 20 MEQ  PACK (POTASSIUM CHLORIDE) once daily;  #30 x 3;  Signed;  Entered by: Everitt Amber;  Authorized by: Syliva Overman MD;  Method used: Electronically to [facility] [address] [place], [place], [place]  [address], Ph: [phone] or [phone], Fax: [phone]    Prescriptions: POTASSIUM CHLORIDE 20 MEQ  PACK (POTASSIUM CHLORIDE) once daily  #30 x 3   Entered by:   Everitt Amber   Authorized by:   Syliva Overman MD   Signed by:   Everitt Amber on 10/22/2008   Method used:   Electronically to        [facility]* (retail)       [address]       [place], [place]  [address]       Ph: [phone] or [phone]       Fax: [phone]   RxID:   [id number]

## 2010-08-19 NOTE — Assessment & Plan Note (Signed)
Summary: OFFICE VISIT   Vital Signs:  Patient Profile:   70 Years Old Female Height:     65 inches (165.10 cm) Weight:      254 pounds (115.45 kg) BMI:     42.42 BSA:     2.19 O2 Sat:      96 % Pulse rate:   73 / minute Resp:     16 per minute BP sitting:   130 / 82  Pt. in pain?   no  Vitals Entered By: Everitt Amber (May 17, 2008 10:57 AM)                  Chief Complaint:  Follow up.  History of Present Illness: The Pt. repors that she feels well. She continues lifestyle modification with succesful weight loss. She has had no recent fevr or chills. She dnies depression, anxiety or insomnia.    Updated Prior Medication List: HYZAAR 100-25 MG  TABS (LOSARTAN POTASSIUM-HCTZ) once daily POTASSIUM CHLORIDE 20 MEQ  PACK (POTASSIUM CHLORIDE) once daily MULTIVITAMINS  TABS (MULTIPLE VITAMIN) Take one tab by mouth once daily  Current Allergies: ! ASA     Review of Systems  ENT      Denies earache, hoarseness, nasal congestion, sinus pressure, and sore throat.  CV      Denies chest pain or discomfort, palpitations, shortness of breath with exertion, and swelling of feet.  Resp      Denies cough, shortness of breath, sputum productive, and wheezing.  GI      Denies abdominal pain, constipation, diarrhea, nausea, and vomiting.  GU      Denies dysuria and urinary frequency.  MS      Denies joint pain, joint swelling, muscle weakness, and stiffness.  Neuro      Denies falling down, headaches, seizures, and tremors.  Psych      Denies anxiety and depression.  Allergy      Complains of seasonal allergies.   Physical Exam  General:     morbidly obese Head:     Normocephalic and atraumatic without obvious abnormalities. No apparent alopecia or balding. Eyes:     vision grossly intact.   Ears:     External ear exam shows no significant lesions or deformities.  Otoscopic examination reveals clear canals, tympanic membranes are intact bilaterally  without bulging, retraction, inflammation or discharge. Hearing is grossly normal bilaterally. Nose:     no external deformity and no nasal discharge.   Mouth:     pharynx pink and moist and fair dentition.   Neck:     No deformities, masses, or tenderness noted. Lungs:     Normal respiratory effort, chest expands symmetrically. Lungs are clear to auscultation, no crackles or wheezes. Heart:     Normal rate and regular rhythm. S1 and S2 normal without gallop, murmur, click, rub or other extra sounds. Abdomen:     soft and non-tender.   Extremities:     No clubbing, cyanosis, edema, or deformity noted with normal full range of motion of all joints.   Skin:     Intact without suspicious lesions or rashes Cervical Nodes:     No lymphadenopathy noted Psych:     Cognition and judgment appear intact. Alert and cooperative with normal attention span and concentration. No apparent delusions, illusions, hallucinations    Impression & Recommendations:  Problem # 1:  GERD (ICD-530.81) Assessment: Improved  Problem # 2:  HYPERTENSION (ICD-401.9) Assessment: Improved  Her updated medication list for  this problem includes:    Hyzaar 100-25 Mg Tabs (Losartan potassium-hctz) ..... Once daily  BP today: 130/82 Prior BP: 150/86 (05/25/2007)  Labs Reviewed: Creat: 0.97 (11/17/2007) Chol: 182 (11/17/2007)   HDL: 44 (11/17/2007)   LDL: 122 (11/17/2007)   TG: 80 (11/17/2007)   Problem # 3:  MORBID OBESITY (ICD-278.01) Assessment: Improved  Complete Medication List: 1)  Hyzaar 100-25 Mg Tabs (Losartan potassium-hctz) .... Once daily 2)  Potassium Chloride 20 Meq Pack (Potassium chloride) .... Once daily 3)  Multivitamins Tabs (Multiple vitamin) .... Take one tab by mouth once daily   Patient Instructions: 1)  Your Bp is great, no med changes. 2)  Please schedule a follow-up appointment in 4 months. 3)  It is important that you exercise regularly at least 20 minutes 5 times a week. If  you develop chest pain, have severe difficulty breathing, or feel very tired , stop exercising immediately and seek medical attention. 4)  You need to lose weight. Consider a lower calorie diet and regular exercise.    ]

## 2010-08-19 NOTE — Progress Notes (Signed)
Summary: [facility]  [facility]   Imported By: Lind Guest 07/30/2008 08:55:19  _____________________________________________________________________  External Attachment:    Type:   Image     Comment:   External Document

## 2010-08-19 NOTE — Progress Notes (Signed)
Summary: RX  Phone Note Call from Patient   Summary of Call: NEED A RX FOR POTUSSIM. [facility] [phone] Initial call taken by: Rudene Anda,  June 07, 2008 10:36 AM  Follow-up for Phone Call        Rx Called In Follow-up by: Worthy Keeler LPN,  June 07, 2008 2:17 PM      Prescriptions: POTASSIUM CHLORIDE 20 MEQ  PACK (POTASSIUM CHLORIDE) once daily  #30 x 1   Entered by:   Worthy Keeler LPN   Authorized by:   Syliva Overman MD   Signed by:   Worthy Keeler LPN on 57/32/2025   Method used:   Electronically to        [facility] (retail)       [address]       [place], [place]  [address]       Ph: [phone] or [phone]       Fax: [phone]   RxID:   [id number]

## 2010-08-19 NOTE — Miscellaneous (Signed)
Summary: Refill  Clinical Lists Changes  Medications: Rx of HYZAAR 100-25 MG  TABS (LOSARTAN POTASSIUM-HCTZ) once daily;  #30 x 4;  Signed;  Entered by: Everitt Amber;  Authorized by: Syliva Overman MD;  Method used: Electronically to [facility]*, [address] [place], [place], [place]  [address], Ph: [phone] or [phone], Fax: [phone]    Prescriptions: HYZAAR 100-25 MG  TABS (LOSARTAN POTASSIUM-HCTZ) once daily  #30 x 4   Entered by:   Everitt Amber   Authorized by:   Syliva Overman MD   Signed by:   Everitt Amber on 09/26/2008   Method used:   Electronically to        [facility]* (retail)       [address]       [place], [place]  [address]       Ph: [phone] or [phone]       Fax: [phone]   RxID:   [id number]

## 2010-08-19 NOTE — Assessment & Plan Note (Signed)
Summary: office visit   Vital Signs:  Patient Profile:   70 Years Old Female Height:     63 inches (160.02 cm) Weight:      252.56 pounds (114.80 kg) BMI:     44.90 BSA:     2.14 Pulse rate:   66 / minute Resp:     16 per minute BP sitting:   140 / 90  (left arm)  Pt. in pain?   yes    Location:   left breast area    Intensity:   6    Type:       moderate  Vitals Entered By: Worthy Keeler LPN (September 21, 5186 10:26 AM)                  Chief Complaint:  follow up chronic problems.  History of Present Illness: Pt pulled a chest of drawers yesterday and since then she has had left chest soreness in the area of her mastectomy site. prior to this she had been doing well. She denies any recent fver or chills. She denies depression, anxiety or insomnia. She intends to work consistently  at weight loss through lifestyle change. she denies significant joint pain or tenderness.    Prior Medication List:  HYZAAR 100-25 MG  TABS (LOSARTAN POTASSIUM-HCTZ) once daily POTASSIUM CHLORIDE 20 MEQ  PACK (POTASSIUM CHLORIDE) once daily MULTIVITAMINS  TABS (MULTIPLE VITAMIN) Take one tab by mouth once daily   Current Allergies: ! ASA     Review of Systems  General      Denies chills and fever.  ENT      Denies decreased hearing, ear discharge, earache, hoarseness, nasal congestion, sinus pressure, and sore throat.  CV      Denies chest pain or discomfort, palpitations, and swelling of feet.  Resp      Denies cough, shortness of breath, sputum productive, and wheezing.  GI      Denies abdominal pain, constipation, diarrhea, nausea, and vomiting.  GU      Denies dysuria and urinary frequency.  MS      See HPI  Derm      Denies lesion(s) and rash.  Psych      Denies anxiety and depression.   Physical Exam  General:     alert, well-hydrated, and overweight-appearing.  HEENT: No facial asymmetry,  EOMI, No sinus tenderness, TM's Clear, oropharynx  pink and  moist.   Chest: Clear to auscultation bilaterally.  CVS: S1, S2, No murmurs, No S3.   Abd: Soft, Nontender.  MS: Adequate ROM spine, hips, shoulders and knees.  Ext: No edema.   CNS: CN 2-12 intact, power tone and sensation normal throughout.   Skin: Intact, no visible lesions or rashes.  Psych: Good eye contact, normal effect.  Memory intact, not anxious or depressed appearing.      Impression & Recommendations:  Problem # 1:  MORBID OBESITY (ICD-278.01) Assessment: Unchanged  Problem # 2:  HYPERTENSION (ICD-401.9) Assessment: Deteriorated  Her updated medication list for this problem includes:    Hyzaar 100-25 Mg Tabs (Losartan potassium-hctz) ..... Once daily    Norvasc 2.5 Mg Tabs (Amlodipine besylate) .Marland Kitchen... Take 1 tablet by mouth once a day  Orders: T-Basic Metabolic Panel [id number])  BP today: 140/90 Prior BP: 130/82 (05/17/2008)  Labs Reviewed: Creat: 0.97 (11/17/2007) Chol: 182 (11/17/2007)   HDL: 44 (11/17/2007)   LDL: 122 (11/17/2007)   TG: 80 (11/17/2007)   Problem # 3:  GERD (ICD-530.81) Assessment: Improved  Problem # 4:  ASTHMA (ICD-493.90) Assessment: Improved  Complete Medication List: 1)  Hyzaar 100-25 Mg Tabs (Losartan potassium-hctz) .... Once daily 2)  Potassium Chloride 20 Meq Pack (Potassium chloride) .... Once daily 3)  Multivitamins Tabs (Multiple vitamin) .... Take one tab by mouth once daily 4)  Norvasc 2.5 Mg Tabs (Amlodipine besylate) .... Take 1 tablet by mouth once a day  Other Orders: T-Lipid Profile ([id number]) T-TSH ([id number]) T-CBC w/Diff [id number])   Patient Instructions: 1)  Please schedule a follow-up appointment in 2 months. 2)  It is important that you exercise regularly at least 60 minutes 6 times a week. If you develop chest pain, have severe difficulty breathing, or feel very tired , stop exercising immediately and seek medical attention. 3)  You need to lose weight. Consider a lower calorie diet and regular  exercise.  4)  You need to start a new bp med , pls continue the hyzaar.   5)  BMP prior to visit, ICD-9: 6)  Lipid Panel prior to visit, ICD-9:   asap, COPY to Dr Jerelyn Scott 7)  TSH prior to visit, ICD-9: 8)  CBC w/ Diff prior to visit, ICD-9:   Prescriptions: NORVASC 2.5 MG TABS (AMLODIPINE BESYLATE) Take 1 tablet by mouth once a day  #30 x 3   Entered and Authorized by:   Syliva Overman MD   Signed by:   Syliva Overman MD on 09/20/2008   Method used:   Electronically to        [facility]* (retail)       [address]       [place], [place]  [address]       Ph: [phone] or [phone]       Fax: [phone]   RxID:   [id number]

## 2010-08-19 NOTE — Assessment & Plan Note (Signed)
Summary: F UP   Vital Signs:  Patient profile:   70 year old female Menstrual status:  hysterectomy Height:      63 inches Weight:      249 pounds BMI:     44.27 O2 Sat:      94 % Pulse rate:   91 / minute Pulse rhythm:   regular Resp:     16 per minute BP sitting:   110 / 80  (left arm) Cuff size:   xl  Vitals Entered By: Everitt Amber LPN (October 21, 1025 2:18 PM)  Nutrition Counseling: Patient's BMI is greater than 25 and therefore counseled on weight management options. CC: Follow up chronic problems   CC:  Follow up chronic problems.  History of Present Illness: Reports  that she has been  doing well. Denies recent fever or chills. Denies sinus pressure, nasal congestion , ear pain or sore throat. Denies chest congestion, or cough productive of sputum. Denies chest pain, palpitations, PND, orthopnea or leg swelling. Denies abdominal pain, nausea, vomitting, diarrhea or constipation. Denies change in bowel movements or bloody stool. Denies dysuria , frequency, incontinence or hesitancy. Denies  joint pain, swelling, or reduced mobility. Denies headaches, vertigo, seizures. Denies depression, anxiety or insomnia. Denies  rash, lesions, or itch. the pt has not been diligent with diet and exercise , ansd has gained weight as a result, she plans to reverse this however.     Current Medications (verified): 1)  Hyzaar 100-25 Mg  Tabs (Losartan Potassium-Hctz) .... Once Daily 2)  Potassium Chloride 20 Meq  Pack (Potassium Chloride) .... Once Daily 3)  Multivitamins  Tabs (Multiple Vitamin) .... Take One Tab By Mouth Once Daily  Allergies (verified): 1)  ! Asa  Review of Systems      See HPI General:  Complains of fatigue. Eyes:  Denies blurring and discharge. Endo:  Denies cold intolerance, excessive hunger, excessive thirst, excessive urination, heat intolerance, polyuria, and weight change. Heme:  Denies abnormal bruising and bleeding. Allergy:  Complains of  seasonal allergies.  Physical Exam  General:  Well-developedobese,in no acute distress; alert,appropriate and cooperative throughout examination HEENT: No facial asymmetry,  EOMI, No sinus tenderness, TM's Clear, oropharynx  pink and moist.   Chest: Clear to auscultation bilaterally.  CVS: S1, S2, No murmurs, No S3.   Abd: Soft, Nontender.  MS: Adequate ROM spine, hips, shoulders and knees.  Ext: No edema.   CNS: CN 2-12 intact, power tone and sensation normal throughout.   Skin: Intact, no visible lesions or rashes.  Psych: Good eye contact, normal affect.  Memory intact, not anxious or depressed appearing.     Impression & Recommendations:  Problem # 1:  MORBID OBESITY (ICD-278.01) Assessment Deteriorated  Ht: 63 (10/21/2009)   Wt: 249 (10/21/2009)   BMI: 44.27 (10/21/2009)  Problem # 2:  HYPERTENSION (ICD-401.9) Assessment: Unchanged  Her updated medication list for this problem includes:    Hyzaar 100-25 Mg Tabs (Losartan potassium-hctz) ..... 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 Once daily 2)  Potassium Chloride 20 Meq Pack (Potassium chloride) .... Once daily 3)  Multivitamins Tabs (Multiple vitamin) .... Take one tab by mouth once daily  Other Orders: T-Lipid Profile [id number]) T-Vitamin D  (25-Hydroxy) [id number]) Gastroenterology Referral (GI)  Patient Instructions: 1)  Please schedule a follow-up appointment in 4 months. 2)  It is important that you exercise regularly at least 20 minutes 5 times a week. If you develop chest pain, have severe difficulty breathing, or feel very tired , stop exercising immediately and seek medical attention. 3)  You need to lose weight. Consider a lower calorie diet and regular exercise.  4)  BMP prior to visit, ICD-9: 5)  Lipid Panel prior to visit, ICD-9: 6)  TSH prior to visit, ICD-9:  fasting labs asap 7)  CBC w/ Diff prior to visit, ICD-9 8)  Vit D: 9)  Your colonscopy is due in Sept 10)  Your blood pressure is great, no med changes

## 2010-08-19 NOTE — Progress Notes (Signed)
Summary: [facility]  [facility]   Imported By: Lind Guest 09/04/2009 09:04:53  _____________________________________________________________________  External Attachment:    Type:   Image     Comment:   External Document

## 2010-08-19 NOTE — Letter (Signed)
Summary: triage/tcs due 03/2010/see prep instructions  triage/tcs due 03/2010/see prep instructions   Imported By: Cloria Spring LPN 04/54/0981 19:14:78  _____________________________________________________________________  External Attachment:    Type:   Image     Comment:   External Document

## 2010-08-19 NOTE — Assessment & Plan Note (Signed)
Summary: OV   Vital Signs:  Patient profile:   70 year old female Menstrual status:  hysterectomy Height:      63 inches Weight:      246.50 pounds BMI:     43.82 O2 Sat:      98 % on Room air Pulse rate:   67 / minute Pulse rhythm:   regular Resp:     16 per minute BP sitting:   130 / 80  (left arm)  Vitals Entered By: Adella Hare LPN (March 27, 2010 10:05 AM)  Nutrition Counseling: Patient's BMI is greater than 25 and therefore counseled on weight management options.  O2 Flow:  Room air CC: follow-up visit Is Patient Diabetic? No Pain Assessment Patient in pain? no      Comments DID NOT BRING MEDS TO OV   CC:  follow-up visit.  History of Present Illness: Reports  that they are doing well. Denies recent fever or chills. Denies sinus pressure, nasal congestion , ear pain or sore throat. Denies chest congestion, or cough productive of sputum. Denies chest pain, palpitations, PND, orthopnea or leg swelling. Denies abdominal pain, nausea, vomitting, diarrhea or constipation. Denies change in bowel movements or bloody stool. Denies dysuria , frequency, incontinence or hesitancy. Chronic mild  joint pain, primarily affecting the knees, she denies swelling, or reduced mobility. Denies headaches, vertigo, seizures. Denies depression, anxiety or insomnia. Denies  rash, lesions, or itch.     Allergies (verified): 1)  ! Asa  Review of Systems      See HPI General:  Complains of fatigue. Eyes:  Denies discharge, eye pain, and red eye. Endo:  Denies cold intolerance, excessive thirst, and excessive urination. Heme:  Denies abnormal bruising and bleeding. Allergy:  Complains of seasonal allergies; denies hives or rash and itching eyes.  Physical Exam  General:  Well-developedobese,in no acute distress; alert,appropriate and cooperative throughout examination HEENT: No facial asymmetry,  EOMI, No sinus tenderness, TM's Clear, oropharynx  pink and moist.   Chest: Clear to auscultation bilaterally.  CVS: S1, S2, No murmurs, No S3.   Abd: Soft, Nontender.  MS: Adequate ROM spine, hips, shoulders and knees.  Ext: No edema.   CNS: CN 2-12 intact, power tone and sensation normal throughout.   Skin: Intact, no visible lesions or rashes.  Psych: Good eye contact, normal affect.  Memory intact, not anxious or depressed appearing.     Impression & Recommendations:  Problem # 1:  MORBID OBESITY (ICD-278.01) Assessment Improved  Orders: T-Lipid Profile ([id number])  Ht: 63 (03/27/2010)   Wt: 246.50 (03/27/2010)   BMI: 43.82 (03/27/2010)  Problem # 2:  HYPERTENSION (ICD-401.9) Assessment: Unchanged  Her updated medication list for this problem includes:    Hyzaar 100-25 Mg Tabs (Losartan potassium-hctz) ..... Once daily  Orders: T-Basic Metabolic Panel [id number])  BP today: 130/80 Prior BP: 110/80 (10/21/2009)  Prior 10 Yr Risk Heart Disease: 15 % (11/22/2008)  Labs Reviewed: K+: 3.8 (10/22/2009) Creat: : 0.95 (10/22/2009)   Chol: 185 (10/22/2009)   HDL: 45 (10/22/2009)   LDL: 123 (10/22/2009)   TG: 87 (10/22/2009)  Problem # 3:  ASTHMA (ICD-493.90) Assessment: Improved  Complete Medication List: 1)  Hyzaar 100-25 Mg Tabs (Losartan potassium-hctz) .... Once daily 2)  Potassium Chloride 20 Meq Pack (Potassium chloride) .... Once daily 3)  Multivitamins Tabs (Multiple vitamin) .... Take one tab by mouth once daily 4)  Vitamin D (ergocalciferol) 50000 Unit Caps (Ergocalciferol) .... One cap by mouth every week  Other  Orders: Influenza Vaccine NON MCR ([address]) Radiology Referral (Radiology) T-Vitamin D (25-Hydroxy) [phone])  Patient Instructions: 1)  f/u in 5.5 months 2)  It is important that you exercise regularly at least 20 minutes 5 times a week. If you develop chest pain, have severe difficulty breathing, or feel very tired , stop exercising immediately and seek medical attention. 3)  You need to lose weight.  Consider a lower calorie diet and regular exercise. Goal is 5 to 10 pounds 4)  Lipid Panel prior to visit, ICD-9: fasting, chem 7 and Vit D in 5.5 months 5)  no med changes today Prescriptions: HYZAAR 100-25 MG  TABS (LOSARTAN POTASSIUM-HCTZ) once daily  #30 x 5   Entered by:   Adella Hare LPN   Authorized by:   Syliva Overman MD   Signed by:   Adella Hare LPN on 91/47/8295   Method used:   Electronically to        [facility] (retail)       [address]       [place], [place]  [address]       Ph: [phone] or [phone]       Fax: [phone]   RxID:   [id number]    Influenza Vaccine    Vaccine Type: Fluvax Non-MCR    Site: right deltoid    Mfr: NOVARTIS    Dose: 0.5 ml    Route: IM    Given by: Adella Hare LPN    Exp. Date: 11/2010    Lot #: [id number]    VIS given: 02/11/10 version given March 27, 2010.

## 2010-08-19 NOTE — Letter (Signed)
Summary: Internal Other Domingo Dimes  Internal Other [facility] Dimes   Imported By: Cloria Spring LPN 91/47/8295 62:13:08  _____________________________________________________________________  External Attachment:    Type:   Image     Comment:   External Document

## 2010-08-19 NOTE — Progress Notes (Signed)
Summary: pt wants different prep  Phone Note Call from Patient Call back at [phone]   Caller: Patient Summary of Call: pt called- she has tcs scheduled for 04/29/10 and received her prep rx today. she stated she wasnt able to drink the trilyte prep because it causes her asthma to" flare up" and she has SOB with it. pt wants to know if she can get another rx for a different prep. pt is aware that [facility] is out of the office untill Monday. She stated it was ok to wait untill she got back. please advise Initial call taken by: Hendricks Limes LPN,  April 17, 2010 3:28 PM     Appended Document: pt wants different prep Pt may have Osmoprep but needs to drink plentyof fluids during her prep.Her urine needs to remain light yellow. Hold Hyzaar the day of her procedure.  Appended Document: pt wants different prep New Rx and instructions mailed. Tried to call pt, not accepting calls at this time.  Appended Document: pt wants different prep Called number [phone] and number temporaily d/c'd. Called [phone] and it is not accepting calls, but Rx and instructions were mailed.  Appended Document: pt wants different prep Pt called to see if the Rx had been changed and if it was mailed or called into pharmacy. I told Mrs Cornwall that the nurse has been trying to reach her and that the Rx and Instructions were mailed out yesterday. Pt said she has moved and didnt have a forwarding address at this time and if we would call in the Rx to [facility]. I told her that I would tell the nurse and we would fax Rx and Instructions over to her pharmacy today.  Appended Document: pt wants different prep Faxed Rx and instructions to [facility].

## 2010-08-19 NOTE — Letter (Signed)
Summary: Work Excuse  [facility]  [address]   [place], [place] [address]   Phone: [phone]  Fax: [phone]    Today's Date: October 28, 2009  Name of Patient: Heather [name]  The above named patient had a medical visit today at: 1:15pm.    Sincerely yours,   Esperanza Sheets PA

## 2010-08-19 NOTE — Miscellaneous (Signed)
Summary: refill  Clinical Lists Changes  Medications: Rx of HYZAAR 100-25 MG  TABS (LOSARTAN POTASSIUM-HCTZ) once daily;  #30 x 3;  Signed;  Entered by: Worthy Keeler LPN;  Authorized by: Syliva Overman MD;  Method used: Electronic    Prescriptions: HYZAAR 100-25 MG  TABS (LOSARTAN POTASSIUM-HCTZ) once daily  #30 x 3   Entered by:   Worthy Keeler LPN   Authorized by:   Syliva Overman MD   Signed by:   Worthy Keeler LPN on 16/04/9603   Method used:   Electronically sent to ...       [facility]*       [address]       [place], [place]  [address]       Ph: [phone] or [phone]       Fax: [phone]   RxID:   [id number]

## 2010-08-21 NOTE — Letter (Signed)
Summary: [facility]   Imported By: Lind Guest 07/31/2010 10:12:10  _____________________________________________________________________  External Attachment:    Type:   Image     Comment:   External Document

## 2010-09-16 ENCOUNTER — Encounter: Payer: Self-pay | Admitting: Family Medicine

## 2010-09-16 ENCOUNTER — Ambulatory Visit (INDEPENDENT_AMBULATORY_CARE_PROVIDER_SITE_OTHER): Payer: PRIVATE HEALTH INSURANCE | Admitting: Family Medicine

## 2010-09-16 DIAGNOSIS — I10 Essential (primary) hypertension: Secondary | ICD-10-CM

## 2010-09-22 ENCOUNTER — Encounter: Payer: Self-pay | Admitting: Family Medicine

## 2010-09-26 LAB — CONVERTED CEMR LAB
BUN: 15 mg/dL (ref 6–23)
CO2: 28 meq/L (ref 19–32)
Calcium: 9.1 mg/dL (ref 8.4–10.5)
Chloride: 102 meq/L (ref 96–112)
Cholesterol: 181 mg/dL (ref 0–200)
Creatinine, Ser: 0.81 mg/dL (ref 0.40–1.20)
Glucose, Bld: 91 mg/dL (ref 70–99)
HDL: 47 mg/dL (ref >39)
Hgb A1c MFr Bld: 5.9 % — ABNORMAL HIGH (ref <5.7)
LDL Cholesterol: 120 mg/dL — ABNORMAL HIGH (ref 0–99)
Potassium: 4.1 meq/L (ref 3.5–5.3)
Sodium: 141 meq/L (ref 135–145)
Total CHOL/HDL Ratio: 3.9
Triglycerides: 72 mg/dL (ref <150)
VLDL: 14 mg/dL (ref 0–40)

## 2010-09-30 NOTE — Letter (Signed)
Summary: Letter  Letter   Imported By: Lind Guest 09/22/2010 14:37:56  _____________________________________________________________________  External Attachment:    Type:   Image     Comment:   External Document

## 2010-09-30 NOTE — Assessment & Plan Note (Signed)
Summary: follow up   Vital Signs:  Patient profile:   70 year old female Menstrual status:  hysterectomy Height:      63 inches Weight:      249 pounds BMI:     44.27 O2 Sat:      97 % Pulse rate:   64 / minute Pulse rhythm:   regular Resp:     16 per minute BP sitting:   122 / 80  (left arm) Cuff size:   xl  Vitals Entered By: Everitt Amber LPN (September 16, 2010 9:36 AM)  Nutrition Counseling: Patient's BMI is greater than 25 and therefore counseled on weight management options. CC: Follow up chronic problems   CC:  Follow up chronic problems.  History of Present Illness: Reports  that she has been fairly well. She was hospitalised overnight in the Fall with chest pain, subsequent work up was negative. Denies recent fever or chills. Denies sinus pressure, nasal congestion , ear pain or sore throat. Denies chest congestion, or cough productive of sputum. Denies chest pain, palpitations, PND, orthopnea or leg swelling. Denies abdominal pain, nausea, vomitting, diarrhea or constipation. Denies change in bowel movements or bloody stool. Denies dysuria , frequency, incontinence or hesitancy. Denies  joint pain, swelling, or reduced mobility. Denies headaches, vertigo, seizures. Denies depression, anxiety or insomnia. Denies  rash, lesions, or itch.     Current Medications (verified): 1)  Hyzaar 100-25 Mg  Tabs (Losartan Potassium-Hctz) .... Once Daily 2)  Potassium Chloride 20 Meq  Pack (Potassium Chloride) .... Once Daily 3)  Vitamin D (Ergocalciferol) 50000 Unit Caps (Ergocalciferol) .... One Cap By Mouth Every Week  Allergies (verified): 1)  ! Asa  Past History:  Past medical, surgical, family and social histories (including risk factors) reviewed, and no changes noted (except as noted below).  Past Medical History: Hypertension GERD Asthma Breast cancer, hx of Congestive heart failure Current Problems:  FAMILY HISTORY OF CAD FEMALE 1ST DEGREE RELATIVE <50  (ICD-V17.3) CONGESTIVE HEART FAILURE (ICD-428.0) BREAST CANCER, HX OF (ICD-V10.3) ASTHMA (ICD-493.90) GERD (ICD-530.81) HYPERTENSION (ICD-401.9) hospitalised overnight at [facility] in Nov 6,2011, with chest pain, reports BP was low at the time,no med change made, had a neg stress tes out pt  Past Surgical History: Reviewed history from 07/29/2007 and no changes required. Lt. Mastectomy-1997 Hysterectomy-1971 Oophorectomy-unilateral 1971  Family History: Reviewed history from 07/29/2007 and no changes required. Family History of CAD Female 1st degree relative <50 Mother:deceased 83yo,Parkinson's Disease Father: 55yo Sisters:6, 2 living, 1 Breast Cancer, 1 Lung Cancer Brothers:6, 1 brother died at 52yo of Throat Cancer  Social History: Reviewed history from 07/29/2007 and no changes required. 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 Eyes:  Denies blurring, discharge, eye pain, and red eye. Endo:  Denies cold intolerance, excessive hunger, excessive thirst, and excessive urination. Heme:  Denies abnormal bruising, bleeding, and enlarge lymph nodes. Allergy:  Denies hives or rash and itching eyes.  Physical Exam  General:  Well-developedobese,in no acute distress; alert,appropriate and cooperative throughout examination HEENT: No facial asymmetry,  EOMI, No sinus tenderness, TM's Clear, oropharynx  pink and moist.   Chest: Clear to auscultation bilaterally.  CVS: S1, S2, No murmurs, No S3.   Abd: Soft, Nontender.  MS: Adequate ROM spine, hips, shoulders and knees.  Ext: No edema.   CNS: CN 2-12 intact, power tone  and sensation normal throughout.   Skin: Intact, no visible lesions or rashes.  Psych: Good eye contact, normal affect.  Memory intact, not anxious or depressed appearing.     Impression &  Recommendations:  Problem # 1:  MORBID OBESITY (ICD-278.01) Assessment Unchanged  Ht: 63 (09/16/2010)   Wt: 249 (09/16/2010)   BMI: 44.27 (09/16/2010) therapeutic lifestyle change discussed and encouraged  Problem # 2:  HYPERTENSION (ICD-401.9) Assessment: Improved  Her updated medication list for this problem includes:    Hyzaar 100-25 Mg Tabs (Losartan potassium-hctz) ..... Once daily  Orders: Medicare Electronic Prescription [phone]) T-Basic Metabolic Panel [phone]) T-Basic Metabolic Panel [phone])  BP today: 122/80 Prior BP: 130/80 (03/27/2010)  Prior 10 Yr Risk Heart Disease: 15 % (11/22/2008)  Labs Reviewed: K+: 3.9 (05/20/2010) Creat: : 0.91 (05/20/2010)   Chol: 198 (05/20/2010)   HDL: 46 (05/20/2010)   LDL: 136 (05/20/2010)   TG: 82 (05/20/2010)  Problem # 3:  GERD (ICD-530.81)  Complete Medication List: 1)  Hyzaar 100-25 Mg Tabs (Losartan potassium-hctz) .... Once daily 2)  Potassium Chloride 20 Meq Pack (Potassium chloride) .... Once daily 3)  Oscal 500/200 D-3 500-200 Mg-unit Tabs (Calcium carbonate-vitamin d) .... Take 1 tablet by mouth three times a day  Other Orders: T-Lipid Profile [phone]) T- Hemoglobin A1C ([id number]) T-Lipid Profile [phone]) T-CBC w/Diff [phone]) T-TSH [phone]) T-Vitamin D (25-Hydroxy) ([id number])  Patient Instructions: 1)  CPE    OCTOBER 19 OR AFTER.Call for appt 2)  Pls resched your colonscopy.  3)  It is important that you exercise regularly at least 20 minutes 5 times a week. If you develop chest pain, have severe difficulty breathing, or feel very tired , stop exercising immediately and seek medical attention. 4)  You need to lose weight. Consider a lower calorie diet and regular exercise.  5)  HbgA1C prior to visit, ICD-9:, CHEM 7 AND FASTING LIPIDS TODAY. 6)  BMP prior to visit, ICD-9: 7)  Lipid Panel prior to visit, ICD-9:  fastin  labs in October 2012 8)  TSH prior to visit, ICD-9: 9)  CBC  w/ Diff prior to visit, ICD-9:, and vit D Prescriptions: POTASSIUM CHLORIDE 20 MEQ  PACK (POTASSIUM CHLORIDE) once daily  #30 x 3   Entered by:   Adella Hare LPN   Authorized by:   Syliva Overman MD   Signed by:   Adella Hare LPN on 54/27/0623   Method used:   Electronically to        [facility]* (retail)       [address]       [place], [place]  [address]       Ph: [phone] or [phone]       Fax: [phone]   RxID:   [id number] HYZAAR 100-25 MG  TABS (LOSARTAN POTASSIUM-HCTZ) once daily  #30 x 3   Entered by:   Adella Hare LPN   Authorized by:   Syliva Overman MD   Signed by:   Adella Hare LPN on 38/04/1750   Method used:   Electronically to        [facility] (retail)       [address]       [place], [place]  [address]       Ph: [phone] or [phone]       Fax: [phone]   RxID:   [id number] OSCAL 500/200 D-3 500-200 MG-UNIT TABS (CALCIUM CARBONATE-VITAMIN  D) Take 1 tablet by mouth three times a day  #90 x 11   Entered and Authorized by:   Syliva Overman MD   Signed by:   Syliva Overman MD on 09/16/2010   Method used:   Electronically to        [facility] (retail)       [address]       [place], [place]  [address]       Ph: [phone] or [phone]       Fax: [phone]   RxID:   [id number]    Orders Added: 1)  Est. Patient Level IV [99371] 2)  Medicare Electronic Prescription [G8553] 3)  T-Basic Metabolic Panel [[id number]] 4)  T-Lipid Profile [80061-22930] 5)  T- Hemoglobin A1C [83036-23375] 6)  T-Basic Metabolic Panel [80048-22910] 7)  T-Lipid Profile [80061-22930] 8)  T-CBC w/Diff [[id number]] 9)  T-TSH [17510-25852] 10)  T-Vitamin D (25-Hydroxy) [77824-23536]

## 2010-12-05 NOTE — Procedures (Signed)
NAMEBRITANEY, [name] NO.:  [id number]   MEDICAL RECORD NO.:  [id number]          PATIENT TYPE:  OUT   LOCATION:  RAD                           FACILITY:  [facility]   PHYSICIAN:  Dani Gobble, MD       DATE OF BIRTH:  1940/09/17   DATE OF PROCEDURE:  05/12/2005  DATE OF DISCHARGE:                                    STRESS TEST   REFERRING PHYSICIAN:  Milus Mallick. Lodema Hong, M.D.   INDICATIONS:  [name] is a 70 year old female who has been told she has  a history of CHF and is referred for evaluation of possible ischemia.   HEMODYNAMIC DATA:  Baseline blood pressure 138/50 mmHg with a pulse of 63  beats per minute.  Baseline 12-lead EKG reveals normal sinus rhythm without  ischemia changes noted.  She walked for 3 minutes and 6 seconds on a full  Bruce protocol surpassing her target heart rate of 133 beats per minute  proceeding to a peak heart rate of 154 beats per minute.  She experienced no  chest discomfort during the procedure.  She did become quite short of breath  and experienced some mild dizziness, but no true presyncope.  EKG at peak  heart rate revealed sinus tachycardia, but no ischemic changes were  appreciated.  Of note, there was excessive baseline artifact.  However, EKG  immediately post exercise was negative for ischemic changes.  She recovered  nicely within 5 minutes into recovery.  Shortness of breath and dizziness  resolved.  She experienced no further symptoms.   IMPRESSION:  1.  Clinically negative for angina.  2.  EKG negative for ischemia.  3.  Overall poor conditional status (deconditioned).  4.  Images are pending.           ______________________________  Dani Gobble, MD     AB/MEDQ  D:  05/12/2005  T:  05/12/2005  Job:  [id number]

## 2010-12-12 ENCOUNTER — Emergency Department (HOSPITAL_COMMUNITY)
Admission: EM | Admit: 2010-12-12 | Discharge: 2010-12-12 | Disposition: A | Discharge status: Home / Self Care | Payer: PRIVATE HEALTH INSURANCE | Attending: Emergency Medicine | Admitting: Emergency Medicine

## 2010-12-12 ENCOUNTER — Encounter (HOSPITAL_COMMUNITY): Payer: Self-pay | Admitting: Radiology

## 2010-12-12 ENCOUNTER — Emergency Department (HOSPITAL_COMMUNITY): Payer: PRIVATE HEALTH INSURANCE

## 2010-12-12 ENCOUNTER — Telehealth: Payer: Self-pay | Admitting: *Deleted

## 2010-12-12 DIAGNOSIS — I10 Essential (primary) hypertension: Secondary | ICD-10-CM | POA: Insufficient documentation

## 2010-12-12 DIAGNOSIS — Z79899 Other long term (current) drug therapy: Secondary | ICD-10-CM | POA: Insufficient documentation

## 2010-12-12 DIAGNOSIS — Z853 Personal history of malignant neoplasm of breast: Secondary | ICD-10-CM | POA: Insufficient documentation

## 2010-12-12 DIAGNOSIS — R209 Unspecified disturbances of skin sensation: Secondary | ICD-10-CM | POA: Insufficient documentation

## 2010-12-12 DIAGNOSIS — M7989 Other specified soft tissue disorders: Secondary | ICD-10-CM | POA: Insufficient documentation

## 2010-12-12 LAB — BASIC METABOLIC PANEL
BUN: 14 mg/dL (ref 6–23)
CO2: 31 mEq/L (ref 19–32)
Calcium: 10.1 mg/dL (ref 8.4–10.5)
Chloride: 102 mEq/L (ref 96–112)
Creatinine, Ser: 0.85 mg/dL (ref 0.4–1.2)
GFR calc Af Amer: >60 mL/min (ref >60)
GFR calc non Af Amer: >60 mL/min (ref >60)
Glucose, Bld: 91 mg/dL (ref 70–99)
Potassium: 3.5 mEq/L (ref 3.5–5.1)
Sodium: 140 mEq/L (ref 135–145)

## 2010-12-12 LAB — PRO B NATRIURETIC PEPTIDE: Pro B Natriuretic peptide (BNP): 111.3 pg/mL (ref 0–125)

## 2010-12-12 NOTE — Telephone Encounter (Signed)
Advised patient er for eval, patient agrees

## 2010-12-16 ENCOUNTER — Telehealth: Payer: Self-pay | Admitting: Family Medicine

## 2010-12-16 NOTE — Telephone Encounter (Signed)
noted 

## 2011-01-01 ENCOUNTER — Telehealth: Payer: Self-pay

## 2011-01-01 NOTE — Telephone Encounter (Signed)
Needs to go to urgent care, medicare guidelines  Need to be followed

## 2011-01-02 NOTE — Telephone Encounter (Signed)
Tried to call x 2. Didn't ring, went straight to recording stating she was not accepting calls at this time

## 2011-01-05 NOTE — Telephone Encounter (Signed)
Called patient, not accepting calls at this time

## 2011-01-06 NOTE — Telephone Encounter (Signed)
Called patient ,Not accepting calls at this time

## 2011-03-20 ENCOUNTER — Other Ambulatory Visit: Payer: Self-pay | Admitting: Family Medicine

## 2011-04-01 ENCOUNTER — Other Ambulatory Visit (HOSPITAL_COMMUNITY): Payer: Self-pay | Admitting: Oncology

## 2011-04-01 DIAGNOSIS — Z9012 Acquired absence of left breast and nipple: Secondary | ICD-10-CM

## 2011-04-01 DIAGNOSIS — Z139 Encounter for screening, unspecified: Secondary | ICD-10-CM

## 2011-04-20 ENCOUNTER — Other Ambulatory Visit: Payer: Self-pay | Admitting: *Deleted

## 2011-04-20 DIAGNOSIS — R5383 Other fatigue: Secondary | ICD-10-CM

## 2011-04-20 DIAGNOSIS — I10 Essential (primary) hypertension: Secondary | ICD-10-CM

## 2011-04-20 DIAGNOSIS — Z13820 Encounter for screening for osteoporosis: Secondary | ICD-10-CM

## 2011-04-20 DIAGNOSIS — R5381 Other malaise: Secondary | ICD-10-CM

## 2011-04-20 MED ORDER — LOSARTAN POTASSIUM-HCTZ 100-25 MG PO TABS: 1.0000 | ORAL_TABLET | Freq: Every day | ORAL | Status: DC

## 2011-04-21 LAB — BASIC METABOLIC PANEL
BUN: 15 mg/dL (ref 6–23)
CO2: 29 mEq/L (ref 19–32)
Calcium: 9 mg/dL (ref 8.4–10.5)
Chloride: 100 mEq/L (ref 96–112)
Creat: 1 mg/dL (ref 0.50–1.10)
Glucose, Bld: 91 mg/dL (ref 70–99)
Potassium: 4 mEq/L (ref 3.5–5.3)
Sodium: 138 mEq/L (ref 135–145)

## 2011-04-21 LAB — CBC WITH DIFFERENTIAL/PLATELET
Basophils Absolute: 0 10*3/uL (ref 0.0–0.1)
Basophils Relative: 0 % (ref 0–1)
Eosinophils Absolute: 0.1 10*3/uL (ref 0.0–0.7)
Eosinophils Relative: 2 % (ref 0–5)
HCT: 38.8 % (ref 36.0–46.0)
Hemoglobin: 12.3 g/dL (ref 12.0–15.0)
Lymphocytes Relative: 28 % (ref 12–46)
Lymphs Abs: 2.1 10*3/uL (ref 0.7–4.0)
MCH: 29.4 pg (ref 26.0–34.0)
MCHC: 31.7 g/dL (ref 30.0–36.0)
MCV: 92.8 fL (ref 78.0–100.0)
Monocytes Absolute: 0.5 10*3/uL (ref 0.1–1.0)
Monocytes Relative: 7 % (ref 3–12)
Neutro Abs: 4.8 10*3/uL (ref 1.7–7.7)
Neutrophils Relative %: 64 % (ref 43–77)
Platelets: 342 10*3/uL (ref 150–400)
RBC: 4.18 MIL/uL (ref 3.87–5.11)
RDW: 15.3 % (ref 11.5–15.5)
WBC: 7.5 10*3/uL (ref 4.0–10.5)

## 2011-04-21 LAB — LIPID PANEL
Cholesterol: 187 mg/dL (ref 0–200)
HDL: 42 mg/dL (ref >39)
LDL Cholesterol: 131 mg/dL — ABNORMAL HIGH (ref 0–99)
Total CHOL/HDL Ratio: 4.5 Ratio
Triglycerides: 68 mg/dL (ref <150)
VLDL: 14 mg/dL (ref 0–40)

## 2011-04-21 LAB — TSH: TSH: 4.687 u[IU]/mL — ABNORMAL HIGH (ref 0.350–4.500)

## 2011-04-22 LAB — VITAMIN D 1,25 DIHYDROXY
Vitamin D 1, 25 (OH)2 Total: 49 pg/mL (ref 18–72)
Vitamin D2 1, 25 (OH)2: <8 pg/mL
Vitamin D3 1, 25 (OH)2: 49 pg/mL

## 2011-04-30 ENCOUNTER — Other Ambulatory Visit: Payer: Self-pay | Admitting: Family Medicine

## 2011-05-04 ENCOUNTER — Ambulatory Visit (HOSPITAL_COMMUNITY): Payer: PRIVATE HEALTH INSURANCE

## 2011-05-07 ENCOUNTER — Encounter: Payer: Self-pay | Admitting: Family Medicine

## 2011-05-12 ENCOUNTER — Ambulatory Visit (INDEPENDENT_AMBULATORY_CARE_PROVIDER_SITE_OTHER): Payer: PRIVATE HEALTH INSURANCE | Admitting: Family Medicine

## 2011-05-12 ENCOUNTER — Encounter: Payer: Self-pay | Admitting: Family Medicine

## 2011-05-12 ENCOUNTER — Ambulatory Visit (HOSPITAL_COMMUNITY)
Admission: RE | Admit: 2011-05-12 | Discharge: 2011-05-12 | Disposition: A | Discharge status: Home / Self Care | Payer: PRIVATE HEALTH INSURANCE | Source: Ambulatory Visit | Attending: Oncology | Admitting: Oncology

## 2011-05-12 DIAGNOSIS — J45909 Unspecified asthma, uncomplicated: Secondary | ICD-10-CM

## 2011-05-12 DIAGNOSIS — Z1231 Encounter for screening mammogram for malignant neoplasm of breast: Secondary | ICD-10-CM | POA: Insufficient documentation

## 2011-05-12 DIAGNOSIS — I10 Essential (primary) hypertension: Secondary | ICD-10-CM

## 2011-05-12 DIAGNOSIS — Z9012 Acquired absence of left breast and nipple: Secondary | ICD-10-CM

## 2011-05-12 DIAGNOSIS — R5383 Other fatigue: Secondary | ICD-10-CM

## 2011-05-12 DIAGNOSIS — Z139 Encounter for screening, unspecified: Secondary | ICD-10-CM

## 2011-05-12 DIAGNOSIS — R5381 Other malaise: Secondary | ICD-10-CM

## 2011-05-12 DIAGNOSIS — R7301 Impaired fasting glucose: Secondary | ICD-10-CM

## 2011-05-12 NOTE — Patient Instructions (Signed)
F/U in 4 month  HBA1C and  TSH in 4 months   Flu vaccine needed and also the shingles vaccine.  It is important that you exercise regularly at least 30 minutes 5 times a week. If you develop chest pain, have severe difficulty breathing, or feel very tired, stop exercising immediately and seek medical attention  A healthy diet is rich in fruit, vegetables and whole grains. Poultry fish, nuts and beans are a healthy choice for protein rather then red meat. A low sodium diet and drinking 64 ounces of water daily is generally recommended. Oils and sweet should be limited. Carbohydrates especially for those who are diabetic or overweight, should be limited to 34-45 gram per meal. It is important to eat on a regular schedule, at least 3 times daily. Snacks should be primarily fruits, vegetables or nuts.  Blood pressure slightly elevated pls increase fruit and veg and reduce salt

## 2011-05-13 MED ORDER — FUROSEMIDE 10 MG/ML IJ SOLN
INTRAMUSCULAR | Status: AC
  Filled 2011-05-13: qty 10

## 2011-05-20 ENCOUNTER — Other Ambulatory Visit (HOSPITAL_COMMUNITY): Payer: Self-pay | Admitting: Oncology

## 2011-05-20 DIAGNOSIS — R928 Other abnormal and inconclusive findings on diagnostic imaging of breast: Secondary | ICD-10-CM

## 2011-06-09 NOTE — Assessment & Plan Note (Signed)
Uncontrolled, no med change at this time , weight loss , exercise, DASH diet only

## 2011-06-09 NOTE — Assessment & Plan Note (Addendum)
Controlled, no recent flares 

## 2011-06-09 NOTE — Progress Notes (Signed)
  Subjective:    Patient ID: Heather [name], female    DOB: [birth year], 70 y.o.   MRN: [id number]  HPI The PT is here for follow up and re-evaluation of chronic medical conditions, medication management and review of any available recent lab and radiology data.  Preventive health is updated, specifically  Cancer screening and Immunization.   Questions or concerns regarding consultations or procedures which the PT has had in the interim are  addressed. The PT denies any adverse reactions to current medications since the last visit.  There are no new concerns.  There are no specific complaints       Review of Systems See HPI Denies recent fever or chills. Denies sinus pressure, nasal congestion, ear pain or sore throat. Denies chest congestion, productive cough or wheezing. Denies chest pains, palpitations and leg swelling Denies abdominal pain, nausea, vomiting,diarrhea or constipation.   Denies dysuria, frequency, hesitancy or incontinence. Denies joint pain, swelling and limitation in mobility. Denies headaches, seizures, numbness, or tingling. Denies depression, anxiety or insomnia. Denies skin break down or rash.        Objective:   Physical Exam Patient alert and oriented and in no cardiopulmonary distress.  HEENT: No facial asymmetry, EOMI, no sinus tenderness,  oropharynx pink and moist.  Neck supple no adenopathy.  Chest: Clear to auscultation bilaterally.  CVS: S1, S2 no murmurs, no S3.  ABD: Soft non tender. Bowel sounds normal.  Ext: No edema  MS: Adequate ROM spine, shoulders, hips and knees.  Skin: Intact, no ulcerations or rash noted.  Psych: Good eye contact, normal affect. Memory intact not anxious or depressed appearing.  CNS: CN 2-12 intact, power, tone and sensation normal throughout.        Assessment & Plan:

## 2011-06-09 NOTE — Assessment & Plan Note (Signed)
Deteriorated. Patient re-educated about  the importance of commitment to a  minimum of 150 minutes of exercise per week. The importance of healthy food choices with portion control discussed. Encouraged to start a food diary, count calories and to consider  joining a support group. Sample diet sheets offered. Goals set by the patient for the next several months.    

## 2011-06-10 ENCOUNTER — Encounter (HOSPITAL_COMMUNITY): Payer: Medicare Other

## 2011-06-20 LAB — HEMOGLOBIN A1C
Hgb A1c MFr Bld: 6 % — ABNORMAL HIGH (ref <5.7)
Mean Plasma Glucose: 126 mg/dL — ABNORMAL HIGH (ref <117)

## 2011-06-20 LAB — TSH: TSH: 4.789 u[IU]/mL — ABNORMAL HIGH (ref 0.350–4.500)

## 2011-06-24 ENCOUNTER — Ambulatory Visit (HOSPITAL_COMMUNITY)
Admission: RE | Admit: 2011-06-24 | Discharge: 2011-06-24 | Disposition: A | Discharge status: Home / Self Care | Payer: PRIVATE HEALTH INSURANCE | Source: Ambulatory Visit | Attending: Oncology | Admitting: Oncology

## 2011-06-24 ENCOUNTER — Other Ambulatory Visit (HOSPITAL_COMMUNITY): Payer: Self-pay | Admitting: Oncology

## 2011-06-24 DIAGNOSIS — R928 Other abnormal and inconclusive findings on diagnostic imaging of breast: Secondary | ICD-10-CM | POA: Insufficient documentation

## 2011-06-25 NOTE — Progress Notes (Signed)
Spoke with [facility] in Radiology. Pt will be sent a letter and they will reschedule follow up screen in 6 months as recommended.

## 2011-07-06 ENCOUNTER — Ambulatory Visit (HOSPITAL_COMMUNITY): Payer: Self-pay | Admitting: Oncology

## 2011-07-28 ENCOUNTER — Encounter (HOSPITAL_COMMUNITY): Payer: PRIVATE HEALTH INSURANCE | Attending: Oncology | Admitting: Oncology

## 2011-07-28 VITALS — BP 147/76 | HR 76 | Temp 98.1°F | Ht 63.0 in | Wt 264.2 lb

## 2011-07-28 DIAGNOSIS — Z853 Personal history of malignant neoplasm of breast: Secondary | ICD-10-CM

## 2011-07-28 DIAGNOSIS — E669 Obesity, unspecified: Secondary | ICD-10-CM

## 2011-07-28 NOTE — Patient Instructions (Addendum)
LAVONNE KINDERMAN  [id number] [birth year]  [facility]  Discharge Instructions  RECOMMENDATIONS MADE BY THE CONSULTANT AND ANY TEST RESULTS WILL BE SENT TO YOUR REFERRING DOCTOR.   EXAM FINDINGS BY MD TODAY AND SIGNS AND SYMPTOMS TO REPORT TO CLINIC OR PRIMARY MD: You are doing well.  Make sure you get the ultrasound as recommended.  MEDICATIONS PRESCRIBED: none INSTRUCTIONS GIVEN AND DISCUSSED: Report any new lumps, bone pain or shortness of breath.  SPECIAL INSTRUCTIONS/FOLLOW-UP: Return to Clinic: in 1 year.   I acknowledge that I have been informed and understand all the instructions given to me and received a copy. I do not have any more questions at this time, but understand that I may call the Specialty Clinic at [facility] at [phone] during business hours should I have any further questions or need assistance in obtaining follow-up care.    __________________________________________  _____________  __________ Signature of Patient or Authorized Representative            Date                   Time    __________________________________________ Nurse's Signature

## 2011-07-28 NOTE — Progress Notes (Signed)
This office note has been dictated.

## 2011-07-28 NOTE — Progress Notes (Signed)
CC:   Heather [name]. Heather [name], M.D.  DIAGNOSES: 1. 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 2. Massive worsening obesity. 3. Esophageal irritation in the past.  HISTORY:  Heather [name] is still seeing Dr. Lodema [name] even though she has moved to [place].  She still comes here for medical care.  She has done well since I have seen her, unfortunately just gaining weight, does not get any exercise, and has a host of excuses as to why she does not, but nevertheless, she has no complaints on oncologic review of systems.  PHYSICAL EXAMINATION:  Vital Signs:  Show the weight is up to 264 pounds and that compares to 243 pounds 2 years ago.  Her height is 5 feet 3 inches, BMI is 46.9, blood pressure 147/76 right arm sitting position, pulse 76 and regular, respirations 16 and unlabored.  She is afebrile. She denies any pain.  Breasts:  Her left chest wall is clear.  She has no adenopathy in any location.  The right breast is without masses. Lungs:  Clear.  Heart:  Shows a regular rhythm and rate without murmur, rub, or gallop.  Abdomen:  Obese without obvious masses.  Extremities: She does not have peripheral edema.  We will see her back in a year.  She is going to have an ultrasound of her right breast in about 4 or 5 months due to a questionable change on a mammogram, but that will be scheduled she [facility].  We will see her back in a year, sooner if need be.    ______________________________ Ladona Horns. Mariel Sleet, MD ESN/MEDQ  D:  07/28/2011  T:  07/28/2011  Job:  [id number]

## 2011-09-11 ENCOUNTER — Ambulatory Visit: Payer: PRIVATE HEALTH INSURANCE | Admitting: Family Medicine

## 2011-09-29 ENCOUNTER — Encounter: Payer: Self-pay | Admitting: Family Medicine

## 2011-09-29 ENCOUNTER — Ambulatory Visit (INDEPENDENT_AMBULATORY_CARE_PROVIDER_SITE_OTHER): Payer: PRIVATE HEALTH INSURANCE | Admitting: Family Medicine

## 2011-09-29 VITALS — BP 130/84 | HR 64 | Resp 16 | Ht 63.0 in | Wt 264.0 lb

## 2011-09-29 DIAGNOSIS — J45909 Unspecified asthma, uncomplicated: Secondary | ICD-10-CM

## 2011-09-29 DIAGNOSIS — R5383 Other fatigue: Secondary | ICD-10-CM

## 2011-09-29 DIAGNOSIS — I10 Essential (primary) hypertension: Secondary | ICD-10-CM

## 2011-09-29 DIAGNOSIS — Z853 Personal history of malignant neoplasm of breast: Secondary | ICD-10-CM

## 2011-09-29 DIAGNOSIS — R5381 Other malaise: Secondary | ICD-10-CM

## 2011-09-29 NOTE — Patient Instructions (Addendum)
F/u in  [facility] September.  Weight loss goal of 8 to 10 pounds  It is important that you exercise regularly at least 30 minutes 5 times a week. If you develop chest pain, have severe difficulty breathing, or feel very tired, stop exercising immediately and seek medical attention   A healthy diet is rich in fruit, vegetables and whole grains. Poultry fish, nuts and beans are a healthy choice for protein rather then red meat. A low sodium diet and drinking 64 ounces of water daily is generally recommended. Oils and sweet should be limited. Carbohydrates especially for those who are diabetic or overweight, should be limited to 30-45 gram per meal. It is important to eat on a regular schedule, at least 3 times daily. Snacks should be primarily fruits, vegetables or nuts.  You need to have your colonoscopy, it is past due  Fasting chem 7, hBA1c, tSH in June  Remember you need follow up mamogram in june

## 2011-10-03 NOTE — Progress Notes (Signed)
  Subjective:    Patient ID: Heather [name], female    DOB: 01/12/1941, 70 y.o.   MRN: [id number]  HPI The PT is here for follow up and re-evaluation of chronic medical conditions, medication management and review of any available recent lab and radiology data.  Preventive health is updated, specifically  Cancer screening and Immunization.   Questions or concerns regarding consultations or procedures which the PT has had in the interim are  addressed. The PT denies any adverse reactions to current medications since the last visit.  There are no new concerns.  There are no specific complaints       Review of Systems See HPI Denies recent fever or chills. Denies sinus pressure, nasal congestion, ear pain or sore throat. Denies chest congestion, productive cough or wheezing. Denies chest pains, palpitations and leg swelling Denies abdominal pain, nausea, vomiting,diarrhea or constipation.   Denies dysuria, frequency, hesitancy or incontinence. Denies joint pain, swelling and limitation in mobility. Denies headaches, seizures, numbness, or tingling. Denies depression, anxiety or insomnia. Denies skin break down or rash.        Objective:   Physical Exam Patient alert and oriented and in no cardiopulmonary distress.  HEENT: No facial asymmetry, EOMI, no sinus tenderness,  oropharynx pink and moist.  Neck supple no adenopathy.  Chest: Clear to auscultation bilaterally.  CVS: S1, S2 no murmurs, no S3.  ABD: Soft non tender. Bowel sounds normal.  Ext: No edema  MS: Adequate ROM spine, shoulders, hips and knees.  Skin: Intact, no ulcerations or rash noted.  Psych: Good eye contact, normal affect. Memory intact not anxious or depressed appearing.  CNS: CN 2-12 intact, power, tone and sensation normal throughout.        Assessment & Plan:   

## 2011-10-03 NOTE — Assessment & Plan Note (Addendum)
Followed by oncology, no recurrence since original dx

## 2011-10-03 NOTE — Assessment & Plan Note (Signed)
Controlled, no change in medication  

## 2011-10-03 NOTE — Assessment & Plan Note (Signed)
Controlled, no recent flre, and on no medication regularly

## 2011-10-03 NOTE — Assessment & Plan Note (Signed)
Deteriorated. Patient re-educated about  the importance of commitment to a  minimum of 150 minutes of exercise per week. The importance of healthy food choices with portion control discussed. Encouraged to start a food diary, count calories and to consider  joining a support group. Sample diet sheets offered. Goals set by the patient for the next several months.    

## 2011-10-06 ENCOUNTER — Other Ambulatory Visit: Payer: Self-pay | Admitting: Family Medicine

## 2011-10-09 ENCOUNTER — Telehealth: Payer: Self-pay | Admitting: Family Medicine

## 2011-10-09 NOTE — Telephone Encounter (Signed)
Pt aware it was sent in 3 days ago

## 2011-11-09 ENCOUNTER — Emergency Department (HOSPITAL_COMMUNITY): Payer: PRIVATE HEALTH INSURANCE

## 2011-11-09 ENCOUNTER — Encounter (HOSPITAL_COMMUNITY): Payer: Self-pay | Admitting: Emergency Medicine

## 2011-11-09 ENCOUNTER — Emergency Department (HOSPITAL_COMMUNITY)
Admission: EM | Admit: 2011-11-09 | Discharge: 2011-11-09 | Disposition: A | Discharge status: Home / Self Care | Payer: PRIVATE HEALTH INSURANCE | Attending: Emergency Medicine | Admitting: Emergency Medicine

## 2011-11-09 DIAGNOSIS — M549 Dorsalgia, unspecified: Secondary | ICD-10-CM | POA: Insufficient documentation

## 2011-11-09 DIAGNOSIS — K219 Gastro-esophageal reflux disease without esophagitis: Secondary | ICD-10-CM | POA: Insufficient documentation

## 2011-11-09 DIAGNOSIS — I2510 Atherosclerotic heart disease of native coronary artery without angina pectoris: Secondary | ICD-10-CM | POA: Insufficient documentation

## 2011-11-09 DIAGNOSIS — I10 Essential (primary) hypertension: Secondary | ICD-10-CM | POA: Insufficient documentation

## 2011-11-09 DIAGNOSIS — I509 Heart failure, unspecified: Secondary | ICD-10-CM | POA: Insufficient documentation

## 2011-11-09 DIAGNOSIS — J45909 Unspecified asthma, uncomplicated: Secondary | ICD-10-CM | POA: Insufficient documentation

## 2011-11-09 DIAGNOSIS — R0789 Other chest pain: Secondary | ICD-10-CM

## 2011-11-09 DIAGNOSIS — R071 Chest pain on breathing: Secondary | ICD-10-CM | POA: Insufficient documentation

## 2011-11-09 MED ORDER — HYDROCODONE-ACETAMINOPHEN 5-325 MG PO TABS
2.0000 | ORAL_TABLET | Freq: Once | ORAL | Status: AC
  Administered 2011-11-09: 2 via ORAL
  Filled 2011-11-09: qty 2

## 2011-11-09 MED ORDER — IBUPROFEN 800 MG PO TABS
800.0000 mg | ORAL_TABLET | Freq: Once | ORAL | Status: DC
  Filled 2011-11-09: qty 1

## 2011-11-09 MED ORDER — DIAZEPAM 5 MG PO TABS
5.0000 mg | ORAL_TABLET | Freq: Once | ORAL | Status: AC
  Administered 2011-11-09: 5 mg via ORAL
  Filled 2011-11-09: qty 1

## 2011-11-09 MED ORDER — HYDROCODONE-ACETAMINOPHEN 5-325 MG PO TABS: ORAL_TABLET | ORAL | Status: DC

## 2011-11-09 NOTE — ED Notes (Signed)
MD at bedside. 

## 2011-11-09 NOTE — ED Provider Notes (Signed)
History    This chart was scribed for [employer]. Colon Branch, MD by Brooks Sailors. The patient was seen in room APA07/APA07. Patient's care was started at 0928.  CSN: [id number]  Arrival date & time 11/09/11  9562   First MD Initiated Contact with Patient 11/09/11 1116      Chief Complaint  Patient presents with  . Back Pain    (Consider location/radiation/quality/duration/timing/severity/associated sxs/prior treatment) HPI  AMILY DEPP is a 71 y.o. female who presents to the Emergency Department complaining of constant back pain onset one week ago which has been persistent since with the pain level fluctuating. Patient notes she was pulling herself onto a "lifted" truck when pain started. The pain is described as achy and patient has been taking tylenol without much relief. Denies vomiting, diarrhea, SOB, cough.   Past Medical History  Diagnosis Date  . GERD (gastroesophageal reflux disease)   . Hypertension   . Asthma   . Breast cancer   . Congestive heart failure   . FH: CAD (coronary artery disease)   . Heart failure   . Personal history of malignant neoplasm of breast     Past Surgical History  Procedure Date  . Mastectomy 1997    left breast   . Vesicovaginal fistula closure w/ tah 1971  . Total abdominal hysterectomy w/ bilateral salpingoophorectomy 1971    unilateral     Family History  Problem Relation Age of Onset  . Coronary artery disease      Family History of males   . Parkinsonism Mother   . Cancer Sister     breast   . Cancer Sister     lung   . Cancer Brother     throat     History  Substance Use Topics  . Smoking status: Former Games developer  . Smokeless tobacco: Not on file  . Alcohol Use: No    OB History    Grav Para Term Preterm Abortions TAB SAB Ect Mult Living                  Review of Systems  All other systems reviewed and are negative.    Allergies  Aspirin  Home Medications     BP 162/81  Pulse 72  Temp 98.3 F  (36.8 C)  Resp 18  Ht 5\' 5"  (1.651 m)  Wt 246 lb (111.585 kg)  BMI 40.94 kg/m2  SpO2 97%  Physical Exam  Nursing note and vitals reviewed. Constitutional: She is oriented to person, place, and time. She appears well-developed and well-nourished.  HENT:  Head: Normocephalic and atraumatic.  Eyes: Conjunctivae and EOM are normal. Pupils are equal, round, and reactive to light.  Neck: Normal range of motion. Neck supple.  Cardiovascular: Normal rate, regular rhythm and normal heart sounds.  Exam reveals no gallop and no friction rub.   No murmur heard. Pulmonary/Chest: Effort normal and breath sounds normal. No respiratory distress. She has no wheezes.  Abdominal: Soft. Bowel sounds are normal.  Musculoskeletal: Normal range of motion. She exhibits tenderness.       LS and parathoracic focal tenderness on the left side to palpation. No crepitus.   Neurological: She is alert and oriented to person, place, and time.  Skin: Skin is warm and dry.  Psychiatric: She has a normal mood and affect.    ED Course  Procedures (including critical care time) DIAGNOSTIC STUDIES: Oxygen Saturation is 97% on room air, normal by my interpretation.  COORDINATION OF CARE: 11:50AM Patient to have x-ray taken and pain medication.  1:57PM Patient feeling much better on pain medication. Informed patient of course of action once leaving hospital.    Dg Chest 2 View  11/09/2011  *RADIOLOGY REPORT*  Clinical Data: Left posterior chest pain.  CHEST - 2 VIEW  Comparison: 12/12/2010.  Findings: Trachea is midline.  Heart size normal.  Lungs are low in volume with minimal bibasilar atelectasis.  Lungs are otherwise clear.  No pleural fluid.  Postoperative changes of left mastectomy and left axillary node dissection are noted.  IMPRESSION: Low lung volumes with minimal bibasilar atelectasis.  Original Report Authenticated By: Reyes Ivan, M.D.   MDM  Patient with a left-sided chest wall pain most likely  due to trying to lift herself up into an SUV. She was given analgesics and muscle relaxant here with relief of the pain.  Pt feels improved after observation and/or treatment in ED.Pt stable in ED with no significant deterioration in condition.The patient appears reasonably screened and/or stabilized for discharge and I doubt any other medical condition or other [facility] requiring further screening, evaluation, or treatment in the ED at this time prior to discharge.  I personally performed the services described in this documentation, which was scribed in my presence. The recorded information has been reviewed and considered.   MDM Reviewed: nursing note and vitals Interpretation: x-ray           Nicoletta Dress. Colon Branch, MD 11/09/11 1422

## 2011-11-09 NOTE — ED Notes (Signed)
Pt c/o left lower back pain x 1 week. Denies gi/gu sx.

## 2011-11-09 NOTE — Discharge Instructions (Signed)
Apply heat and ice for comfort. Use the pain medicine as needed. Follow up with your doctor.

## 2011-11-09 NOTE — ED Notes (Signed)
Pt refused Ibuprofen thinking that it has aspirin  in med, explained to pt that ibuprofen does not but pt still refused med

## 2011-11-18 ENCOUNTER — Other Ambulatory Visit (HOSPITAL_COMMUNITY): Payer: Self-pay | Admitting: Oncology

## 2011-11-18 DIAGNOSIS — Z09 Encounter for follow-up examination after completed treatment for conditions other than malignant neoplasm: Secondary | ICD-10-CM

## 2011-12-30 ENCOUNTER — Ambulatory Visit (HOSPITAL_COMMUNITY)
Admission: RE | Admit: 2011-12-30 | Discharge: 2011-12-30 | Disposition: A | Discharge status: Home / Self Care | Payer: PRIVATE HEALTH INSURANCE | Source: Ambulatory Visit | Attending: Oncology | Admitting: Oncology

## 2011-12-30 DIAGNOSIS — N63 Unspecified lump in unspecified breast: Secondary | ICD-10-CM | POA: Insufficient documentation

## 2011-12-30 DIAGNOSIS — Z09 Encounter for follow-up examination after completed treatment for conditions other than malignant neoplasm: Secondary | ICD-10-CM | POA: Insufficient documentation

## 2011-12-30 LAB — BASIC METABOLIC PANEL
BUN: 14 mg/dL (ref 6–23)
CO2: 29 mEq/L (ref 19–32)
Calcium: 9.6 mg/dL (ref 8.4–10.5)
Chloride: 100 mEq/L (ref 96–112)
Creat: 0.99 mg/dL (ref 0.50–1.10)
Glucose, Bld: 94 mg/dL (ref 70–99)
Potassium: 4.1 mEq/L (ref 3.5–5.3)
Sodium: 141 mEq/L (ref 135–145)

## 2011-12-30 LAB — TSH: TSH: 4.073 u[IU]/mL (ref 0.350–4.500)

## 2011-12-30 LAB — HEMOGLOBIN A1C
Hgb A1c MFr Bld: 5.6 % (ref <5.7)
Mean Plasma Glucose: 114 mg/dL (ref <117)

## 2012-01-11 ENCOUNTER — Other Ambulatory Visit: Payer: Self-pay | Admitting: Family Medicine

## 2012-03-23 ENCOUNTER — Other Ambulatory Visit: Payer: Self-pay | Admitting: Family Medicine

## 2012-04-08 ENCOUNTER — Encounter: Payer: Self-pay | Admitting: Family Medicine

## 2012-04-08 ENCOUNTER — Ambulatory Visit: Payer: PRIVATE HEALTH INSURANCE | Admitting: Family Medicine

## 2012-06-07 ENCOUNTER — Other Ambulatory Visit (HOSPITAL_COMMUNITY): Payer: Self-pay | Admitting: Oncology

## 2012-06-07 DIAGNOSIS — Z139 Encounter for screening, unspecified: Secondary | ICD-10-CM

## 2012-06-22 ENCOUNTER — Encounter: Payer: PRIVATE HEALTH INSURANCE | Admitting: Family Medicine

## 2012-06-23 ENCOUNTER — Other Ambulatory Visit: Payer: Self-pay | Admitting: Family Medicine

## 2012-06-23 ENCOUNTER — Ambulatory Visit (HOSPITAL_COMMUNITY): Payer: PRIVATE HEALTH INSURANCE

## 2012-07-27 ENCOUNTER — Ambulatory Visit (HOSPITAL_COMMUNITY): Payer: PRIVATE HEALTH INSURANCE | Admitting: Oncology

## 2012-08-10 ENCOUNTER — Encounter (HOSPITAL_COMMUNITY): Payer: Self-pay

## 2012-08-22 ENCOUNTER — Ambulatory Visit (INDEPENDENT_AMBULATORY_CARE_PROVIDER_SITE_OTHER): Payer: PRIVATE HEALTH INSURANCE | Admitting: Family Medicine

## 2012-08-22 ENCOUNTER — Encounter: Payer: Self-pay | Admitting: Family Medicine

## 2012-08-22 VITALS — BP 152/84 | HR 100 | Resp 20 | Ht 63.0 in | Wt 263.1 lb

## 2012-08-22 DIAGNOSIS — R7303 Prediabetes: Secondary | ICD-10-CM

## 2012-08-22 DIAGNOSIS — R5381 Other malaise: Secondary | ICD-10-CM

## 2012-08-22 DIAGNOSIS — Z131 Encounter for screening for diabetes mellitus: Secondary | ICD-10-CM

## 2012-08-22 DIAGNOSIS — R7309 Other abnormal glucose: Secondary | ICD-10-CM

## 2012-08-22 DIAGNOSIS — R5383 Other fatigue: Secondary | ICD-10-CM

## 2012-08-22 DIAGNOSIS — I10 Essential (primary) hypertension: Secondary | ICD-10-CM

## 2012-08-22 DIAGNOSIS — J45909 Unspecified asthma, uncomplicated: Secondary | ICD-10-CM

## 2012-08-22 LAB — BASIC METABOLIC PANEL
BUN: 16 mg/dL (ref 6–23)
CO2: 31 mEq/L (ref 19–32)
Calcium: 9.5 mg/dL (ref 8.4–10.5)
Chloride: 102 mEq/L (ref 96–112)
Creat: 0.97 mg/dL (ref 0.50–1.10)
Glucose, Bld: 98 mg/dL (ref 70–99)
Potassium: 3.8 mEq/L (ref 3.5–5.3)
Sodium: 141 mEq/L (ref 135–145)

## 2012-08-22 LAB — CBC
HCT: 37 % (ref 36.0–46.0)
Hemoglobin: 12.4 g/dL (ref 12.0–15.0)
MCH: 29.5 pg (ref 26.0–34.0)
MCHC: 33.5 g/dL (ref 30.0–36.0)
MCV: 87.9 fL (ref 78.0–100.0)
Platelets: 359 10*3/uL (ref 150–400)
RBC: 4.21 MIL/uL (ref 3.87–5.11)
RDW: 14.1 % (ref 11.5–15.5)
WBC: 8.5 10*3/uL (ref 4.0–10.5)

## 2012-08-22 LAB — HEMOGLOBIN A1C
Hgb A1c MFr Bld: 6 % — ABNORMAL HIGH (ref <5.7)
Mean Plasma Glucose: 126 mg/dL — ABNORMAL HIGH (ref <117)

## 2012-08-22 NOTE — Patient Instructions (Addendum)
Annual wellness in mid May.  Please get your flu vaccine today.   CBC, hBA1C, vit D and chem 7 today.   Please work on weight loss   Ok to use  Tylenol for pain  Blood pressure is high today iit is vital you take your meds every day as prescribed

## 2012-08-22 NOTE — Progress Notes (Signed)
  Subjective:    Patient ID: Heather [name], female    DOB: 06-25-1941, 72 y.o.   MRN: [id number]  HPI The PT is here for follow up and re-evaluation of chronic medical conditions, medication management and review of any available recent lab and radiology data.  Preventive health is updated, specifically  Cancer screening and Immunization.   Questions or concerns regarding consultations or procedures which the PT has had in the interim are  addressed. The PT denies any adverse reactions to current medications since the last visit.  States she has had the  Flu twice year, current clear runny nose, no fever or cgills, no cough  Hurt her left foot twisted 3 days ago , now has pain up to the hip, wants no medication stated she wants to wear out the pain     Review of Systems See HPI Denies recent fever or chills. Denies sinus pressure, nasal congestion, ear pain or sore throat. Denies chest congestion, productive cough or wheezing. Denies chest pains, palpitations and leg swelling Denies abdominal pain, nausea, vomiting,diarrhea or constipation.   Denies dysuria, frequency, hesitancy or incontinence.  Denies headaches, seizures, numbness, or tingling. Denies depression, anxiety or insomnia. Denies skin break down or rash.        Objective:   Physical Exam Patient alert and oriented and in no cardiopulmonary distress.  HEENT: No facial asymmetry, EOMI, no sinus tenderness,  oropharynx pink and moist.  Neck supple no adenopathy.  Chest: Clear to auscultation bilaterally.  CVS: S1, S2 no murmurs, no S3.  ABD: Soft non tender. Bowel sounds normal.  Ext: No edema  MS: Adequate ROM spine, shoulders, hips and knees.Decreased ROM left ankle  Skin: Intact, no ulcerations or rash noted.  Psych: Good eye contact, normal affect. Memory intact not anxious or depressed appearing.  CNS: CN 2-12 intact, power, tone and sensation normal throughout.        Assessment & Plan:

## 2012-08-23 ENCOUNTER — Encounter (HOSPITAL_COMMUNITY): Payer: Self-pay | Admitting: Oncology

## 2012-08-23 ENCOUNTER — Encounter (HOSPITAL_COMMUNITY): Payer: PRIVATE HEALTH INSURANCE | Attending: Oncology | Admitting: Oncology

## 2012-08-23 VITALS — BP 164/84 | HR 65 | Temp 98.4°F | Resp 18 | Wt 259.0 lb

## 2012-08-23 DIAGNOSIS — E559 Vitamin D deficiency, unspecified: Secondary | ICD-10-CM

## 2012-08-23 DIAGNOSIS — Z853 Personal history of malignant neoplasm of breast: Secondary | ICD-10-CM

## 2012-08-23 DIAGNOSIS — I10 Essential (primary) hypertension: Secondary | ICD-10-CM

## 2012-08-23 LAB — VITAMIN D 25 HYDROXY (VIT D DEFICIENCY, FRACTURES): Vit D, 25-Hydroxy: 24 ng/mL — ABNORMAL LOW (ref 30–89)

## 2012-08-23 NOTE — Patient Instructions (Addendum)
.  [facility] Discharge Instructions  RECOMMENDATIONS MADE BY THE CONSULTANT AND ANY TEST RESULTS WILL BE SENT TO YOUR REFERRING PHYSICIAN.  EXAM FINDINGS BY THE PHYSICIAN TODAY AND SIGNS OR SYMPTOMS TO REPORT TO CLINIC OR PRIMARY PHYSICIAN: Exam per Dr. Mariel Sleet Report any new lumps or bumps   SPECIAL INSTRUCTIONS/FOLLOW-UP: Return in 1 year  Thank you for choosing [facility] to provide your oncology and hematology care.  To afford each patient quality time with our providers, please arrive at least 15 minutes before your scheduled appointment time.  With your help, our goal is to use those 15 minutes to complete the necessary work-up to ensure our physicians have the information they need to help with your evaluation and healthcare recommendations.    Effective January 1st, 2014, we ask that you re-schedule your appointment with our physicians should you arrive 10 or more minutes late for your appointment.  We strive to give you quality time with our providers, and arriving late affects you and other patients whose appointments are after yours.    Again, thank you for choosing [facility].  Our hope is that these requests will decrease the amount of time that you wait before being seen by our physicians.       _____________________________________________________________  Should you have questions after your visit to [facility], please contact our office at [phone] between the hours of 8:30 a.m. and 5:00 p.m.  Voicemails left after 4:30 p.m. will not be returned until the following business day.  For prescription refill requests, have your pharmacy contact our office with your prescription refill request.

## 2012-08-23 NOTE — Progress Notes (Signed)
Problem number 1 stage II B. 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 All therapy finished as of 01/05/1997 and thus far she is without recurrent disease. Problem #2 obesity weighing 259 pounds on a 5 foot 3 inch frame her weight is down 5 pounds in one year. Problem #3 esophageal irritation of the past Problem #4 vitamin D deficiency Problem #5 hypertension  She has a negative oncology review of systems still at this point in time. She is living independently and watching a small 57-month-old child on a regular basis. She stays very active. She cooks her on food. Her bowels are working perfectly well.  Vital signs are stable. Lymph nodes remain negative throughout. The left chest wall is clear. The right breast is negative for any masses. Heart shows a regular rhythm and rate without murmur rub or gallop. Lungs are clear. Abdomen is still distended with this obese tummy. Bowel sounds are normal. She has no obvious leg edema. Both arms are very large but with mild lymphedema on the left. She had blood work just the other day and so we will not repeated. We will see her back in a year.

## 2012-09-02 DIAGNOSIS — R7303 Prediabetes: Secondary | ICD-10-CM | POA: Insufficient documentation

## 2012-09-02 NOTE — Assessment & Plan Note (Signed)
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 DASH diet and commitment to daily physical activity for a minimum of 30 minutes discussed and encouraged, as a part of hypertension management. The importance of attaining a healthy weight is also discussed.

## 2012-09-02 NOTE — Assessment & Plan Note (Signed)
No recent flares, uses albuterol as needed

## 2012-09-02 NOTE — Assessment & Plan Note (Signed)
deteriorated with increase in HBa1c Patient educated about the importance of limiting  Carbohydrate intake , the need to commit to daily physical activity for a minimum of 30 minutes , and to commit weight loss. The fact that changes in all these areas will reduce or eliminate all together the development of diabetes is stressed.

## 2012-09-02 NOTE — Assessment & Plan Note (Signed)
Deteriorated. Patient re-educated about  the importance of commitment to a  minimum of 150 minutes of exercise per week. The importance of healthy food choices with portion control discussed. Encouraged to start a food diary, count calories and to consider  joining a support group. Sample diet sheets offered. Goals set by the patient for the next several months.    

## 2012-10-17 ENCOUNTER — Other Ambulatory Visit: Payer: Self-pay | Admitting: Family Medicine

## 2012-10-21 ENCOUNTER — Other Ambulatory Visit: Payer: Self-pay

## 2012-10-21 MED ORDER — POTASSIUM CHLORIDE CRYS ER 20 MEQ PO TBCR: EXTENDED_RELEASE_TABLET | ORAL | Status: DC

## 2012-10-21 MED ORDER — LOSARTAN POTASSIUM-HCTZ 100-25 MG PO TABS: ORAL_TABLET | ORAL | Status: DC

## 2012-11-29 ENCOUNTER — Encounter: Payer: Self-pay | Admitting: Family Medicine

## 2012-11-29 ENCOUNTER — Ambulatory Visit (INDEPENDENT_AMBULATORY_CARE_PROVIDER_SITE_OTHER): Payer: PRIVATE HEALTH INSURANCE | Admitting: Family Medicine

## 2012-11-29 VITALS — BP 134/80 | HR 60 | Resp 18 | Ht 63.0 in | Wt 263.0 lb

## 2012-11-29 DIAGNOSIS — E785 Hyperlipidemia, unspecified: Secondary | ICD-10-CM

## 2012-11-29 DIAGNOSIS — R7303 Prediabetes: Secondary | ICD-10-CM

## 2012-11-29 DIAGNOSIS — R7309 Other abnormal glucose: Secondary | ICD-10-CM

## 2012-11-29 DIAGNOSIS — Z13220 Encounter for screening for lipoid disorders: Secondary | ICD-10-CM

## 2012-11-29 DIAGNOSIS — I10 Essential (primary) hypertension: Secondary | ICD-10-CM

## 2012-11-29 MED ORDER — CALCIUM CARBONATE-VITAMIN D 500-200 MG-UNIT PO TABS: 1.0000 | ORAL_TABLET | Freq: Two times a day (BID) | ORAL | Status: DC

## 2012-11-29 NOTE — Patient Instructions (Addendum)
Annual wellness in 4 month, call if you need me before   Blood sugar is increased, you need to STOP cookies , ice cream cake and candy  Please commit to walking daily and join the [employer] for fun exercise classes   HBA1C ,fasting lipid, TSH and chem 7 in 4 month  You are PREDIABETIC, pls attend class, we will also go over 15gm carbohydrate portion sizes  You need to lose weight aim for 3 pounds per month  Check your pharmacy for the shingles vaccine, you need this

## 2012-11-29 NOTE — Progress Notes (Signed)
  Subjective:    Patient ID: Heather [name], female    DOB: 08-05-1940, 72 y.o.   MRN: [id number]  HPI The PT is here for follow up and re-evaluation of chronic medical conditions, medication management and review of any available recent lab and radiology data.  Preventive health is updated, specifically  Cancer screening and Immunization.   Questions or concerns regarding consultations or procedures which the PT has had in the interim are  addressed. The PT denies any adverse reactions to current medications since the last visit.  There are no new concerns.  There are no specific complaints       Review of Systems See HPI Denies recent fever or chills. Denies sinus pressure, nasal congestion, ear pain or sore throat. Denies chest congestion, productive cough or wheezing. Denies chest pains, palpitations and leg swelling Denies abdominal pain, nausea, vomiting,diarrhea or constipation.   Denies dysuria, frequency, hesitancy or incontinence. Denies joint pain, swelling and limitation in mobility. Denies headaches, seizures, numbness, or tingling. Denies depression, anxiety or insomnia. Denies skin break down or rash.        Objective:   Physical Exam  Patient alert and oriented and in no cardiopulmonary distress.  HEENT: No facial asymmetry, EOMI, no sinus tenderness,  oropharynx pink and moist.  Neck supple no adenopathy.  Chest: Clear to auscultation bilaterally.  CVS: S1, S2 no murmurs, no S3.  ABD: Soft non tender. Bowel sounds normal.  Ext: chronic left upper ext swelling, no pedal edema  MS: Adequate ROM spine, shoulders, hips and knees.  Skin: Intact, no ulcerations or rash noted.  Psych: Good eye contact, normal affect. Memory intact not anxious or depressed appearing.  CNS: CN 2-12 intact, power, tone and sensation normal throughout.       Assessment & Plan:

## 2012-12-25 DIAGNOSIS — E785 Hyperlipidemia, unspecified: Secondary | ICD-10-CM | POA: Insufficient documentation

## 2012-12-25 NOTE — Assessment & Plan Note (Signed)
Unchanged,  Patient educated about the importance of limiting  Carbohydrate intake , the need to commit to daily physical activity for a minimum of 30 minutes , and to commit weight loss. The fact that changes in all these areas will reduce or eliminate all together the development of diabetes is stressed.

## 2012-12-25 NOTE — Assessment & Plan Note (Signed)
Controlled, no change in medication DASH diet and commitment to daily physical activity for a minimum of 30 minutes discussed and encouraged, as a part of hypertension management. The importance of attaining a healthy weight is also discussed.  

## 2012-12-25 NOTE — Assessment & Plan Note (Signed)
Hyperlipidemia:Low fat diet discussed and encouraged.  Updated lab needed 

## 2012-12-25 NOTE — Assessment & Plan Note (Signed)
Unchanged. Patient re-educated about  the importance of commitment to a  minimum of 150 minutes of exercise per week. The importance of healthy food choices with portion control discussed. Encouraged to start a food diary, count calories and to consider  joining a support group. Sample diet sheets offered. Goals set by the patient for the next several months.    

## 2013-02-20 ENCOUNTER — Other Ambulatory Visit: Payer: Self-pay | Admitting: Family Medicine

## 2013-04-01 ENCOUNTER — Other Ambulatory Visit: Payer: Self-pay | Admitting: Family Medicine

## 2013-04-01 LAB — BASIC METABOLIC PANEL
BUN: 13 mg/dL (ref 6–23)
CO2: 30 mEq/L (ref 19–32)
Calcium: 9.3 mg/dL (ref 8.4–10.5)
Chloride: 101 mEq/L (ref 96–112)
Creat: 0.9 mg/dL (ref 0.50–1.10)
Glucose, Bld: 90 mg/dL (ref 70–99)
Potassium: 4.1 mEq/L (ref 3.5–5.3)
Sodium: 140 mEq/L (ref 135–145)

## 2013-04-01 LAB — LIPID PANEL
Cholesterol: 176 mg/dL (ref 0–200)
HDL: 40 mg/dL (ref >39)
LDL Cholesterol: 118 mg/dL — ABNORMAL HIGH (ref 0–99)
Total CHOL/HDL Ratio: 4.4 Ratio
Triglycerides: 92 mg/dL (ref <150)
VLDL: 18 mg/dL (ref 0–40)

## 2013-04-01 LAB — TSH: TSH: 4.265 u[IU]/mL (ref 0.350–4.500)

## 2013-04-01 LAB — HEMOGLOBIN A1C
Hgb A1c MFr Bld: 6.1 % — ABNORMAL HIGH (ref <5.7)
Mean Plasma Glucose: 128 mg/dL — ABNORMAL HIGH (ref <117)

## 2013-04-12 ENCOUNTER — Encounter: Payer: Self-pay | Admitting: Family Medicine

## 2013-04-12 ENCOUNTER — Ambulatory Visit (INDEPENDENT_AMBULATORY_CARE_PROVIDER_SITE_OTHER): Payer: PRIVATE HEALTH INSURANCE | Admitting: Family Medicine

## 2013-04-12 VITALS — BP 136/84 | HR 80 | Resp 16 | Wt 264.4 lb

## 2013-04-12 DIAGNOSIS — Z1231 Encounter for screening mammogram for malignant neoplasm of breast: Secondary | ICD-10-CM

## 2013-04-12 DIAGNOSIS — I10 Essential (primary) hypertension: Secondary | ICD-10-CM

## 2013-04-12 DIAGNOSIS — Z0000 Encounter for general adult medical examination without abnormal findings: Secondary | ICD-10-CM

## 2013-04-12 DIAGNOSIS — R7303 Prediabetes: Secondary | ICD-10-CM

## 2013-04-12 DIAGNOSIS — R7309 Other abnormal glucose: Secondary | ICD-10-CM

## 2013-04-12 DIAGNOSIS — Z13820 Encounter for screening for osteoporosis: Secondary | ICD-10-CM

## 2013-04-12 DIAGNOSIS — Z23 Encounter for immunization: Secondary | ICD-10-CM

## 2013-04-12 DIAGNOSIS — R7301 Impaired fasting glucose: Secondary | ICD-10-CM

## 2013-04-12 DIAGNOSIS — Z1211 Encounter for screening for malignant neoplasm of colon: Secondary | ICD-10-CM

## 2013-04-12 DIAGNOSIS — Z853 Personal history of malignant neoplasm of breast: Secondary | ICD-10-CM

## 2013-04-12 DIAGNOSIS — Z1239 Encounter for other screening for malignant neoplasm of breast: Secondary | ICD-10-CM

## 2013-04-12 MED ORDER — METFORMIN HCL ER 500 MG PO TB24: 500.0000 mg | ORAL_TABLET | Freq: Every day | ORAL | Status: DC

## 2013-04-12 NOTE — Progress Notes (Signed)
Subjective:    Patient ID: Heather [name], female    DOB: 20-Dec-1940, 72 y.o.   MRN: [id number]  HPI Preventive Screening-Counseling & Management   Patient present here today for a Medicare annual wellness visit.   Current Problems (verified)   Medications Prior to Visit Allergies (verified)   PAST HISTORY  Family History  Social History Divorced  In her 56's , mom of 6 children 1 girl adopted Disabled age 79 due to breast cancer, was a [employer], [employer]  Risk Factors  Current exercise habits:  Less than 3 days per week  Dietary issues discussed:low fat , low carb   Cardiac risk factors: none significant  Depression Screen  (Note: if answer to either of the following is "Yes", a more complete depression screening is indicated)   Over the past two weeks, have you felt down, depressed or hopeless? No  Over the past two weeks, have you felt little interest or pleasure in doing things? No  Have you lost interest or pleasure in daily life? No  Do you often feel hopeless? No  Do you cry easily over simple problems? No   Activities of Daily Living  In your present state of health, do you have any difficulty performing the following activities?  Driving?: No Managing money?: No Feeding yourself?:No Getting from bed to chair?:No Climbing a flight of stairs?:sometimes, uses a cane at times Preparing food and eating?:No Bathing or showering?:No Getting dressed?:No Getting to the toilet?:No Using the toilet?:No Moving around from place to place?: sometimes needs a cane, left leg gives away and feels weak sometimes for past 8 month Fall Risk Assessment In the past year have you fallen or had a near fall?:No Are you currently taking any medications that make you dizzy?:No   Hearing Difficulties: No Do you often ask people to speak up or repeat themselves?:No Do you experience ringing or noises in your ears?:No Do you have difficulty understanding soft or whispered  voices?:No  Cognitive Testing  Alert? Yes Normal Appearance?Yes  Oriented to person? Yes Place? Yes  Time? Yes  Displays appropriate judgment?Yes  Can read the correct time from a watch face? yes Are you having problems remembering things?No  Advanced Directives have been discussed with the patient?Yes , full code   List the Names of Other Physician/Practitioners you currently use: oncology, doctors vision   Indicate any recent Medical Services you may have received from other than [facility] providers in the past year (date may be approximate).   Assessment:    Annual Wellness Exam   Plan:    During the course of the visit the patient was educated and counseled about appropriate screening and preventive services including:  A healthy diet is rich in fruit, vegetables and whole grains. Poultry fish, nuts and beans are a healthy choice for protein rather then red meat. A low sodium diet and drinking 64 ounces of water daily is generally recommended. Oils and sweet should be limited. Carbohydrates especially for those who are diabetic or overweight, should be limited to 30-45 gram per meal. It is important to eat on a regular schedule, at least 3 times daily. Snacks should be primarily fruits, vegetables or nuts. It is important that you exercise regularly at least 30 minutes 5 times a week. If you develop chest pain, have severe difficulty breathing, or feel very tired, stop exercising immediately and seek medical attention  Immunization reviewed and updated. Cancer screening reviewed and updated    Patient Instructions (the written plan)  was given to the patient.  Medicare Attestation  I have personally reviewed:  The patient's medical and social history  Their use of alcohol, tobacco or illicit drugs  Their current medications and supplements  The patient's functional ability including ADLs,fall risks, home safety risks, cognitive, and hearing and visual impairment  Diet and  physical activities  Evidence for depression or mood disorders  The patient's weight, height, BMI, and visual acuity have been recorded in the chart. I have made referrals, counseling, and provided education to the patient based on review of the above and I have provided the patient with a written personalized care plan for preventive services.      Review of Systems     Objective:   Physical Exam        Assessment & Plan:

## 2013-04-12 NOTE — Patient Instructions (Addendum)
F/u in 4 month, call if you need me before  Check your pharmacy for the shingles vaccine  Flu vaccine today   You are referred for mammogram and also for bone density scan and  For colonoscopy, stop at checkout for 2 of the 3 appts  We will give you info about group session to learn how to eat to help prevent diabetes, I will also start metformin one daily It is important that you exercise regularly at least 30 minutes 5 times a week. If you develop chest pain, have severe difficulty breathing, or feel very tired, stop exercising immediately and seek medical attention   HBA1C and chem 7 in 4 month, non fasting  Weight loss goal of 6 pounds in 4 month

## 2013-04-13 ENCOUNTER — Other Ambulatory Visit: Payer: Self-pay

## 2013-04-13 ENCOUNTER — Other Ambulatory Visit: Payer: Self-pay | Admitting: Family Medicine

## 2013-04-13 ENCOUNTER — Telehealth: Payer: Self-pay

## 2013-04-13 ENCOUNTER — Telehealth: Payer: Self-pay | Admitting: Family Medicine

## 2013-04-13 DIAGNOSIS — Z1211 Encounter for screening for malignant neoplasm of colon: Secondary | ICD-10-CM

## 2013-04-13 DIAGNOSIS — Z1239 Encounter for other screening for malignant neoplasm of breast: Secondary | ICD-10-CM

## 2013-04-13 DIAGNOSIS — Z9012 Acquired absence of left breast and nipple: Secondary | ICD-10-CM

## 2013-04-13 NOTE — Telephone Encounter (Signed)
Pt called this afternoon to be set up for her tcs. She said that Dr Lodema Hong had referred her to [place]. I told her that I would have the triage nurse call her back.

## 2013-04-13 NOTE — Telephone Encounter (Signed)
Pt was referred by Dr. Simpson for screening colonoscopy. LMOM for a return call.  

## 2013-04-13 NOTE — Telephone Encounter (Signed)
Order changed.

## 2013-04-14 NOTE — Telephone Encounter (Signed)
Gastroenterology Pre-Procedure Review  Request Date: 04/13/2013 Requesting Physician: Dr. Lodema Hong  PATIENT REVIEW QUESTIONS: The patient responded to the following health history questions as indicated:    1. Diabetes Melitis: YES    JUST STARTING METFORMIN SHE WILL START IT ON 04/21/2013 2. Joint replacements in the past 12 months: no 3. Major health problems in the past 3 months: no 4. Has an artificial valve or MVP: no 5. Has a defibrillator: no 6. Has been advised in past to take antibiotics in advance of a procedure like teeth cleaning: no    MEDICATIONS & ALLERGIES:    Patient reports the following regarding taking any blood thinners:   Plavix? no Aspirin? no Coumadin? no  Patient confirms/reports the following medications:  Current Outpatient Prescriptions  Medication Sig Dispense Refill  . calcium-vitamin D (OSCAL 500/200 D-3) 500-200 MG-UNIT per tablet Take 1 tablet by mouth 2 (two) times daily.  180 tablet  3  . losartan-hydrochlorothiazide (HYZAAR) 100-25 MG per tablet TAKE ONE (1) TABLET EACH DAY  30 tablet  2  . Multiple Vitamins-Minerals (MULTIVITAMIN WITH MINERALS) tablet Take 1 tablet by mouth daily.      . potassium chloride SA (KLOR-CON M20) 20 MEQ tablet TAKE ONE (1) TABLET EACH DAY  30 tablet  3  . metFORMIN (GLUCOPHAGE XR) 500 MG 24 hr tablet Take 1 tablet (500 mg total) by mouth daily with breakfast.  30 tablet  5   No current facility-administered medications for this visit.    Patient confirms/reports the following allergies:  Allergies  Allergen Reactions  . Aspirin Swelling    CLOSES HER THROAT    No orders of the defined types were placed in this encounter.    AUTHORIZATION INFORMATION Primary Insurance:   ID #:   Group #:  Pre-Cert / Auth required:  Pre-Cert / Auth #:   Secondary Insurance:  ID #: Group #:  Pre-Cert / Auth required:  Pre-Cert / Auth #:   SCHEDULE INFORMATION: Procedure has been scheduled as follows:  Date: 05/03/2013            Time:  8:30 AM Location: [facility] Short Stay  This Gastroenterology Pre-Precedure Review Form is being routed to the following provider(s): Jonette Eva, MD

## 2013-04-16 DIAGNOSIS — Z0000 Encounter for general adult medical examination without abnormal findings: Secondary | ICD-10-CM | POA: Insufficient documentation

## 2013-04-16 NOTE — Assessment & Plan Note (Signed)
Annual wellness as documented Pt is fully functional. Morbid obesity is her major health problem and she is prediabetic. Counseled extensively in the hope that she will follow through to improve this Needs colonoscopy and is referred Full code , needs to further discuss with her children other end of life issues

## 2013-04-19 ENCOUNTER — Ambulatory Visit (HOSPITAL_COMMUNITY)
Admission: RE | Admit: 2013-04-19 | Discharge: 2013-04-19 | Disposition: A | Discharge status: Home / Self Care | Payer: PRIVATE HEALTH INSURANCE | Source: Ambulatory Visit | Attending: Family Medicine | Admitting: Family Medicine

## 2013-04-19 DIAGNOSIS — R29890 Loss of height: Secondary | ICD-10-CM | POA: Insufficient documentation

## 2013-04-19 DIAGNOSIS — Z780 Asymptomatic menopausal state: Secondary | ICD-10-CM | POA: Insufficient documentation

## 2013-04-19 DIAGNOSIS — Z853 Personal history of malignant neoplasm of breast: Secondary | ICD-10-CM | POA: Insufficient documentation

## 2013-04-19 DIAGNOSIS — Z13820 Encounter for screening for osteoporosis: Secondary | ICD-10-CM

## 2013-04-19 NOTE — Telephone Encounter (Signed)
MOVI PREP SPLIT DOSING, CLEAR LIQUIDS WITH BREAKFAST. CONTINUE GLUCOPHAGE. 

## 2013-04-20 ENCOUNTER — Encounter (HOSPITAL_COMMUNITY): Payer: Self-pay | Admitting: Pharmacy Technician

## 2013-04-20 MED ORDER — PEG-KCL-NACL-NASULF-NA ASC-C 100 G PO SOLR: 1.0000 | ORAL | Status: DC

## 2013-04-20 NOTE — Telephone Encounter (Signed)
Rx was sent to the pharmacy and instructions were mailed to pt.  

## 2013-04-24 ENCOUNTER — Telehealth: Payer: Self-pay | Admitting: *Deleted

## 2013-04-24 ENCOUNTER — Ambulatory Visit (HOSPITAL_COMMUNITY)
Admission: RE | Admit: 2013-04-24 | Discharge: 2013-04-24 | Disposition: A | Discharge status: Home / Self Care | Payer: PRIVATE HEALTH INSURANCE | Source: Ambulatory Visit | Attending: Family Medicine | Admitting: Family Medicine

## 2013-04-24 ENCOUNTER — Telehealth: Payer: Self-pay

## 2013-04-24 DIAGNOSIS — Z1231 Encounter for screening mammogram for malignant neoplasm of breast: Secondary | ICD-10-CM | POA: Insufficient documentation

## 2013-04-24 DIAGNOSIS — Z9012 Acquired absence of left breast and nipple: Secondary | ICD-10-CM

## 2013-04-24 DIAGNOSIS — Z1239 Encounter for other screening for malignant neoplasm of breast: Secondary | ICD-10-CM

## 2013-04-24 NOTE — Telephone Encounter (Signed)
Error

## 2013-04-24 NOTE — Telephone Encounter (Signed)
Pt called in reference to prep. She said she cannot drink it. ( She was given movie prep ) I tried to explain that it was not as much as the trilyte and she would be drinking some at 5:00 pm and some at 7:30 pm. She still said she cannot drink it.  She is scheduled for 05/03/2013 at 8:30 Am in the morning. She said she wants something else. She said when she drinks too much it flares her asthma and causes her to wheeze. Please advise!

## 2013-04-26 MED ORDER — SOD PICOSULFATE-MAG OX-CIT ACD 10-3.5-12 MG-GM-GM PO PACK: 1.0000 | PACK | Freq: Once | ORAL | Status: DC

## 2013-04-26 NOTE — Telephone Encounter (Signed)
PLEASE CALL PT. SHE MAY TRY PREPOPIK WHICH IS ONLY TWO CUPS OF LIQUID. SHE MUST DRINK PLENTY OF FLUIDS. SHE SHOULD DRINK WATER TO KEEP HER URINE LIGHT YELLOW. THE PREP CAN CAUSE KIDNEY PROBLEMS IF SHE DOESN'T STAY HYDRATED.

## 2013-04-26 NOTE — Telephone Encounter (Signed)
LMOM for pt to call. New Rx sent to the pharmacy and instructions mailed to pt.

## 2013-04-26 NOTE — Addendum Note (Signed)
Addended by: Lavena Bullion on: 04/26/2013 01:33 PM   Modules accepted: Orders

## 2013-05-01 NOTE — Telephone Encounter (Signed)
Tried to call pt to make sure she got new instructions/RX. Recording said unavailable and to try again later.

## 2013-05-02 NOTE — Telephone Encounter (Signed)
Tried to call pt. Message said that pt is unavailable.

## 2013-05-03 ENCOUNTER — Ambulatory Visit (HOSPITAL_COMMUNITY)
Admission: RE | Admit: 2013-05-03 | Payer: PRIVATE HEALTH INSURANCE | Source: Ambulatory Visit | Admitting: Gastroenterology

## 2013-05-03 SURGERY — COLONOSCOPY
Anesthesia: Moderate Sedation

## 2013-07-08 ENCOUNTER — Other Ambulatory Visit: Payer: Self-pay | Admitting: Family Medicine

## 2013-07-10 ENCOUNTER — Other Ambulatory Visit: Payer: Self-pay

## 2013-07-10 DIAGNOSIS — R7303 Prediabetes: Secondary | ICD-10-CM

## 2013-07-10 MED ORDER — METFORMIN HCL ER 500 MG PO TB24: 500.0000 mg | ORAL_TABLET | Freq: Every day | ORAL | Status: DC

## 2013-07-29 ENCOUNTER — Other Ambulatory Visit: Payer: Self-pay | Admitting: Family Medicine

## 2013-07-31 ENCOUNTER — Other Ambulatory Visit: Payer: Self-pay

## 2013-07-31 MED ORDER — POTASSIUM CHLORIDE CRYS ER 20 MEQ PO TBCR: 20.0000 meq | EXTENDED_RELEASE_TABLET | Freq: Every day | ORAL | Status: DC

## 2013-08-10 ENCOUNTER — Ambulatory Visit: Payer: PRIVATE HEALTH INSURANCE | Admitting: Family Medicine

## 2013-08-23 ENCOUNTER — Other Ambulatory Visit (HOSPITAL_COMMUNITY): Payer: PRIVATE HEALTH INSURANCE

## 2013-08-23 ENCOUNTER — Ambulatory Visit (HOSPITAL_COMMUNITY): Payer: PRIVATE HEALTH INSURANCE | Admitting: Oncology

## 2013-08-23 ENCOUNTER — Ambulatory Visit (HOSPITAL_COMMUNITY): Payer: PRIVATE HEALTH INSURANCE

## 2013-08-24 NOTE — Progress Notes (Signed)
This encounter was created in error - please disregard.

## 2013-08-25 ENCOUNTER — Encounter: Payer: Self-pay | Admitting: Family Medicine

## 2013-08-25 ENCOUNTER — Encounter (INDEPENDENT_AMBULATORY_CARE_PROVIDER_SITE_OTHER): Payer: Self-pay

## 2013-08-25 ENCOUNTER — Ambulatory Visit (INDEPENDENT_AMBULATORY_CARE_PROVIDER_SITE_OTHER): Payer: PRIVATE HEALTH INSURANCE | Admitting: Family Medicine

## 2013-08-25 VITALS — BP 128/82 | HR 74 | Resp 16 | Ht 63.0 in | Wt 255.0 lb

## 2013-08-25 DIAGNOSIS — I10 Essential (primary) hypertension: Secondary | ICD-10-CM

## 2013-08-25 DIAGNOSIS — J309 Allergic rhinitis, unspecified: Secondary | ICD-10-CM

## 2013-08-25 DIAGNOSIS — E785 Hyperlipidemia, unspecified: Secondary | ICD-10-CM

## 2013-08-25 DIAGNOSIS — J302 Other seasonal allergic rhinitis: Secondary | ICD-10-CM

## 2013-08-25 DIAGNOSIS — R5381 Other malaise: Secondary | ICD-10-CM

## 2013-08-25 DIAGNOSIS — J45909 Unspecified asthma, uncomplicated: Secondary | ICD-10-CM

## 2013-08-25 DIAGNOSIS — R7309 Other abnormal glucose: Secondary | ICD-10-CM

## 2013-08-25 DIAGNOSIS — E8881 Metabolic syndrome: Secondary | ICD-10-CM

## 2013-08-25 DIAGNOSIS — R5383 Other fatigue: Secondary | ICD-10-CM

## 2013-08-25 DIAGNOSIS — R7303 Prediabetes: Secondary | ICD-10-CM

## 2013-08-25 DIAGNOSIS — Z853 Personal history of malignant neoplasm of breast: Secondary | ICD-10-CM

## 2013-08-25 LAB — BASIC METABOLIC PANEL
BUN: 13 mg/dL (ref 6–23)
CO2: 29 mEq/L (ref 19–32)
Calcium: 9.6 mg/dL (ref 8.4–10.5)
Chloride: 99 mEq/L (ref 96–112)
Creat: 0.86 mg/dL (ref 0.50–1.10)
Glucose, Bld: 93 mg/dL (ref 70–99)
Potassium: 3.8 mEq/L (ref 3.5–5.3)
Sodium: 139 mEq/L (ref 135–145)

## 2013-08-25 LAB — CBC WITH DIFFERENTIAL/PLATELET
Basophils Absolute: 0 10*3/uL (ref 0.0–0.1)
Basophils Relative: 0 % (ref 0–1)
Eosinophils Absolute: 0.4 10*3/uL (ref 0.0–0.7)
Eosinophils Relative: 4 % (ref 0–5)
HCT: 41 % (ref 36.0–46.0)
Hemoglobin: 13.8 g/dL (ref 12.0–15.0)
Lymphocytes Relative: 30 % (ref 12–46)
Lymphs Abs: 2.8 10*3/uL (ref 0.7–4.0)
MCH: 30.1 pg (ref 26.0–34.0)
MCHC: 33.7 g/dL (ref 30.0–36.0)
MCV: 89.5 fL (ref 78.0–100.0)
Monocytes Absolute: 0.7 10*3/uL (ref 0.1–1.0)
Monocytes Relative: 7 % (ref 3–12)
Neutro Abs: 5.5 10*3/uL (ref 1.7–7.7)
Neutrophils Relative %: 59 % (ref 43–77)
Platelets: 388 10*3/uL (ref 150–400)
RBC: 4.58 MIL/uL (ref 3.87–5.11)
RDW: 13.8 % (ref 11.5–15.5)
WBC: 9.3 10*3/uL (ref 4.0–10.5)

## 2013-08-25 LAB — LIPID PANEL
Cholesterol: 167 mg/dL (ref 0–200)
HDL: 40 mg/dL (ref >39)
LDL Cholesterol: 107 mg/dL — ABNORMAL HIGH (ref 0–99)
Total CHOL/HDL Ratio: 4.2 Ratio
Triglycerides: 101 mg/dL (ref <150)
VLDL: 20 mg/dL (ref 0–40)

## 2013-08-25 LAB — HEMOGLOBIN A1C
Hgb A1c MFr Bld: 6 % — ABNORMAL HIGH (ref <5.7)
Mean Plasma Glucose: 126 mg/dL — ABNORMAL HIGH (ref <117)

## 2013-08-25 MED ORDER — LORATADINE 10 MG PO TABS: 10.0000 mg | ORAL_TABLET | Freq: Every day | ORAL | Status: DC

## 2013-08-25 NOTE — Patient Instructions (Signed)
F/u in 4 month, call if you need me before  Fasting CBC, lipid, chem 7, HBa1C  Today  Congrats on weight loss , keep it up  Script for mastectomy bras and prosthesis today  MH:DQQI get shingles vaccine today!  Loratidine sent in for allergies

## 2013-08-26 ENCOUNTER — Telehealth: Payer: Self-pay | Admitting: Family Medicine

## 2013-08-26 DIAGNOSIS — E8881 Metabolic syndrome: Secondary | ICD-10-CM | POA: Insufficient documentation

## 2013-08-26 NOTE — Telephone Encounter (Signed)
Pls contact pt , let her know Dr Oneida Alar advises her to contact her office re Prep needed for her colonoscopy, she responded to msg I sent her during pt's ov , let pt know

## 2013-08-26 NOTE — Assessment & Plan Note (Signed)
incresed symptoms, needs to start daily med
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
## 2013-08-26 NOTE — Assessment & Plan Note (Signed)
Improved. Pt applauded on succesful weight loss through lifestyle change, and encouraged to continue same. Weight loss goal set for the next several months.  

## 2013-08-26 NOTE — Assessment & Plan Note (Signed)
Improved, though LDL still elevated. Hyperlipidemia:Low fat diet discussed and encouraged.

## 2013-08-26 NOTE — Assessment & Plan Note (Signed)
Denies need for inhaler for over 6 month

## 2013-08-26 NOTE — Assessment & Plan Note (Signed)
Patient educated about the importance of limiting  Carbohydrate intake , the need to commit to daily physical activity for a minimum of 30 minutes , and to commit weight loss. The fact that changes in all these areas will reduce or eliminate all together the development of diabetes is stressed.    

## 2013-08-26 NOTE — Progress Notes (Signed)
   Subjective:    Patient ID: Heather [name], female    DOB: 1940/11/25, 73 y.o.   MRN: [id number]  HPI    Review of Systems     Objective:   Physical Exam        Assessment & Plan:  HYPERTENSION Controlled, no change in medication DASH diet and commitment to daily physical activity for a minimum of 30 minutes discussed and encouraged, as a part of hypertension management. The importance of attaining a healthy weight is also discussed.    Metabolic syndrome X The increased risk of cardiovascular disease associated with this diagnosis, and the need to consistently work on lifestyle to change this is discussed. Following  a  heart healthy diet ,commitment to 30 minutes of exercise at least 5 days per week, as well as control of blood sugar and cholesterol , and achieving a healthy weight are all the areas to be addressed .   Dyslipidemia Improved, though LDL still elevated. Hyperlipidemia:Low fat diet discussed and encouraged.    Prediabetes Patient educated about the importance of limiting  Carbohydrate intake , the need to commit to daily physical activity for a minimum of 30 minutes , and to commit weight loss. The fact that changes in all these areas will reduce or eliminate all together the development of diabetes is stressed.     MORBID OBESITY Improved. Pt applauded on succesful weight loss through lifestyle change, and encouraged to continue same. Weight loss goal set for the next several months.   Seasonal allergies incresed symptoms, needs to start daily med  ASTHMA Denies need for inhaler for over 6 month

## 2013-08-26 NOTE — Assessment & Plan Note (Signed)
The increased risk of cardiovascular disease associated with this diagnosis, and the need to consistently work on lifestyle to change this is discussed. Following  a  heart healthy diet ,commitment to 30 minutes of exercise at least 5 days per week, as well as control of blood sugar and cholesterol , and achieving a healthy weight are all the areas to be addressed .  

## 2013-08-26 NOTE — Assessment & Plan Note (Signed)
Controlled, no change in medication DASH diet and commitment to daily physical activity for a minimum of 30 minutes discussed and encouraged, as a part of hypertension management. The importance of attaining a healthy weight is also discussed.  

## 2013-08-28 ENCOUNTER — Encounter (HOSPITAL_COMMUNITY): Payer: Self-pay

## 2013-08-28 ENCOUNTER — Telehealth: Payer: Self-pay

## 2013-08-28 ENCOUNTER — Telehealth: Payer: Self-pay | Admitting: Gastroenterology

## 2013-08-28 ENCOUNTER — Other Ambulatory Visit: Payer: Self-pay

## 2013-08-28 DIAGNOSIS — Z1211 Encounter for screening for malignant neoplasm of colon: Secondary | ICD-10-CM

## 2013-08-28 NOTE — Telephone Encounter (Signed)
[place] PT ASAP FOR TCS WITH PREPOPIK-DRINK WATER TO KEEP URINE LIGHT YELLOW.  PT SHOULD DROP OFF RX 3 DAYS PRIOR TO PROCEDURE.

## 2013-08-28 NOTE — Telephone Encounter (Signed)
Per Dr. Oneida Alar, called to triage pt for screening colonoscopy. We have a Prepopik, and I told pt that she can come by and pick it up and get the instructions, of which I went over with her on the phone, and told her I will review with her when she comes in to pick it up.

## 2013-08-28 NOTE — Telephone Encounter (Signed)
noted 

## 2013-08-28 NOTE — Telephone Encounter (Signed)
REVIEWED.  

## 2013-08-28 NOTE — Telephone Encounter (Signed)
Forwarding to Doris 

## 2013-08-28 NOTE — Telephone Encounter (Signed)
Gastroenterology Pre-Procedure Review  Request Date: 08/28/2013 Requesting Physician: Dr. Moshe Cipro  PATIENT REVIEW QUESTIONS: The patient responded to the following health history questions as indicated:    1. Diabetes Melitis: no  ( Pt said she never started the Metformin and she is not diabetic) 2. Joint replacements in the past 12 months: no 3. Major health problems in the past 3 months: no 4. Has an artificial valve or MVP: no 5. Has a defibrillator: no 6. Has been advised in past to take antibiotics in advance of a procedure like teeth cleaning: no    MEDICATIONS & ALLERGIES:    Patient reports the following regarding taking any blood thinners:   Plavix? no Aspirin? no Coumadin? no  Patient confirms/reports the following medications:  Current Outpatient Prescriptions  Medication Sig Dispense Refill  . loratadine (CLARITIN) 10 MG tablet Take 1 tablet (10 mg total) by mouth daily.  30 tablet  11  . losartan-hydrochlorothiazide (HYZAAR) 100-25 MG per tablet TAKE ONE (1) TABLET EACH DAY  30 tablet  3  . NON FORMULARY Vitamin D    Once daily      . potassium chloride SA (K-DUR,KLOR-CON) 20 MEQ tablet Take 1 tablet (20 mEq total) by mouth daily.  30 tablet  2  . Sod Picosulfate-Mag Ox-Cit Acd 10-3.5-12 MG-GM-GM PACK Take 1 kit by mouth once.  1 each  0  . vitamin E 400 UNIT capsule Take 400 Units by mouth daily.       No current facility-administered medications for this visit.    Patient confirms/reports the following allergies:  Allergies  Allergen Reactions  . Aspirin Anaphylaxis and Swelling  . Penicillins Swelling    Causes throat to swell    No orders of the defined types were placed in this encounter.    AUTHORIZATION INFORMATION Primary Insurance:   ID #:   Group #:  Pre-Cert / Auth required:  Pre-Cert / Auth #:   Secondary Insurance:   ID #: Group #:  Pre-Cert / Auth required: Pre-Cert / Auth #:   SCHEDULE INFORMATION: Procedure has been scheduled as  follows:  Date: 09/12/2013             Time: 10:30 AM  Location: [facility] Short Stay  This Gastroenterology Pre-Precedure Review Form is being routed to the following provider(s): Barney Drain, MD

## 2013-08-29 NOTE — Telephone Encounter (Signed)
Pt is scheduled for 09/12/2013 at 10:30 and will come by office to pick up a prep and instructions.

## 2013-08-29 NOTE — Telephone Encounter (Signed)
Patient aware and states that she has been in contact with Dr. Oneida Alar office.

## 2013-09-01 ENCOUNTER — Encounter (HOSPITAL_COMMUNITY): Payer: Self-pay | Admitting: Pharmacy Technician

## 2013-09-12 ENCOUNTER — Telehealth: Payer: Self-pay
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
## 2013-09-25 MED ORDER — PEG-KCL-NACL-NASULF-NA ASC-C 100 G PO SOLR: 1.0000 | ORAL | Status: DC

## 2013-09-25 NOTE — Telephone Encounter (Signed)
Rx sent to the pharmacy and instructions mailed to pt.  

## 2013-09-25 NOTE — Telephone Encounter (Signed)
TCS MAR 24

## 2013-09-25 NOTE — Addendum Note (Signed)
Addended by: Everardo All on: 09/25/2013 12:05 PM   Modules accepted: Orders
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
## 2013-10-03 ENCOUNTER — Encounter (HOSPITAL_COMMUNITY): Payer: PRIVATE HEALTH INSURANCE | Attending: Oncology | Admitting: Oncology

## 2013-10-03 ENCOUNTER — Encounter (HOSPITAL_COMMUNITY): Payer: Self-pay | Admitting: Oncology

## 2013-10-03 VITALS — BP 149/82 | Temp 97.8°F | Resp 20 | Wt 255.5 lb

## 2013-10-03 DIAGNOSIS — Z853 Personal history of malignant neoplasm of breast: Secondary | ICD-10-CM

## 2013-10-03 NOTE — Patient Instructions (Signed)
[facility] Discharge Instructions  RECOMMENDATIONS MADE BY THE CONSULTANT AND ANY TEST RESULTS WILL BE SENT TO YOUR REFERRING PHYSICIAN.  MEDICATIONS PRESCRIBED:  None  INSTRUCTIONS GIVEN AND DISCUSSED: Follow-up with Dr. Moshe Cipro as directed Continue with yearly mammogram.  Your next mammogram is due in October 2015.  SPECIAL INSTRUCTIONS/FOLLOW-UP: Return to the [facility] in 12 months for follow-up.   Thank you for choosing [facility] to provide your oncology and hematology care.  To afford each patient quality time with our providers, please arrive at least 15 minutes before your scheduled appointment time.  With your help, our goal is to use those 15 minutes to complete the necessary work-up to ensure our physicians have the information they need to help with your evaluation and healthcare recommendations.    Effective January 1st, 2014, we ask that you re-schedule your appointment with our physicians should you arrive 10 or more minutes late for your appointment.  We strive to give you quality time with our providers, and arriving late affects you and other patients whose appointments are after yours.    Again, thank you for choosing [facility].  Our hope is that these requests will decrease the amount of time that you wait before being seen by our physicians.       _____________________________________________________________  Should you have questions after your visit to [facility], please contact our office at [phone] between the hours of 8:30 a.m. and 5:00 p.m.  Voicemails left after 4:30 p.m. will not be returned until the following business day.  For prescription refill requests, have your pharmacy contact our office with your prescription refill request.

## 2013-10-10 ENCOUNTER — Ambulatory Visit (HOSPITAL_COMMUNITY)
Admission: RE | Admit: 2013-10-10 | Discharge: 2013-10-10 | Disposition: A | Discharge status: Home / Self Care | Payer: PRIVATE HEALTH INSURANCE | Source: Ambulatory Visit | Attending: Gastroenterology | Admitting: Gastroenterology

## 2013-10-10 ENCOUNTER — Encounter (HOSPITAL_COMMUNITY)
Admission: RE | Disposition: A | Discharge status: Home / Self Care | Payer: Self-pay | Source: Ambulatory Visit | Attending: Gastroenterology

## 2013-10-10 SURGERY — COLONOSCOPY
Anesthesia: Moderate Sedation

## 2013-10-10 MED ORDER — ONDANSETRON HCL 4 MG/2ML IJ SOLN
INTRAMUSCULAR | Status: AC
  Filled 2013-10-10: qty 2

## 2013-10-10 MED ORDER — MIDAZOLAM HCL 5 MG/5ML IJ SOLN
INTRAMUSCULAR | Status: AC
  Filled 2013-10-10: qty 10

## 2013-10-10 MED ORDER — MEPERIDINE HCL 100 MG/ML IJ SOLN
INTRAMUSCULAR | Status: AC
  Filled 2013-10-10: qty 2

## 2013-10-10 MED ORDER — LIDOCAINE VISCOUS 2 % MT SOLN
OROMUCOSAL | Status: AC
  Filled 2013-10-10: qty 15

## 2013-12-26 ENCOUNTER — Ambulatory Visit (INDEPENDENT_AMBULATORY_CARE_PROVIDER_SITE_OTHER): Payer: PRIVATE HEALTH INSURANCE | Admitting: Family Medicine

## 2013-12-26 ENCOUNTER — Encounter (INDEPENDENT_AMBULATORY_CARE_PROVIDER_SITE_OTHER): Payer: Self-pay

## 2013-12-26 ENCOUNTER — Encounter: Payer: Self-pay | Admitting: Family Medicine

## 2013-12-26 VITALS — BP 128/82 | HR 75 | Resp 16 | Wt 257.1 lb

## 2013-12-26 DIAGNOSIS — J45909 Unspecified asthma, uncomplicated: Secondary | ICD-10-CM

## 2013-12-26 DIAGNOSIS — E8881 Metabolic syndrome: Secondary | ICD-10-CM

## 2013-12-26 DIAGNOSIS — R7303 Prediabetes: Secondary | ICD-10-CM

## 2013-12-26 DIAGNOSIS — M899 Disorder of bone, unspecified: Secondary | ICD-10-CM

## 2013-12-26 DIAGNOSIS — E785 Hyperlipidemia, unspecified: Secondary | ICD-10-CM

## 2013-12-26 DIAGNOSIS — R7309 Other abnormal glucose: Secondary | ICD-10-CM

## 2013-12-26 DIAGNOSIS — I10 Essential (primary) hypertension: Secondary | ICD-10-CM

## 2013-12-26 DIAGNOSIS — M949 Disorder of cartilage, unspecified: Secondary | ICD-10-CM

## 2013-12-26 LAB — HEMOGLOBIN A1C
Hgb A1c MFr Bld: 5.9 % — ABNORMAL HIGH (ref <5.7)
Mean Plasma Glucose: 123 mg/dL — ABNORMAL HIGH (ref <117)

## 2013-12-26 MED ORDER — METFORMIN HCL 500 MG PO TABS: 500.0000 mg | ORAL_TABLET | Freq: Two times a day (BID) | ORAL | Status: DC

## 2013-12-26 NOTE — Patient Instructions (Addendum)
Pelvic and breast exam in 4 month, call if you need me before  You need the colonoscopy pls get this done!  Start metformin 550m one daily  For blood sugar,  Labs today HBA1C, chem 7 and EGFR and vit D

## 2013-12-27 LAB — COMPLETE METABOLIC PANEL WITH GFR
ALT: <8 U/L (ref 0–35)
AST: 12 U/L (ref 0–37)
Albumin: 3.9 g/dL (ref 3.5–5.2)
Alkaline Phosphatase: 78 U/L (ref 39–117)
BUN: 13 mg/dL (ref 6–23)
CO2: 29 mEq/L (ref 19–32)
Calcium: 9.2 mg/dL (ref 8.4–10.5)
Chloride: 101 mEq/L (ref 96–112)
Creat: 0.88 mg/dL (ref 0.50–1.10)
GFR, Est African American: 76 mL/min
GFR, Est Non African American: 66 mL/min
Glucose, Bld: 96 mg/dL (ref 70–99)
Potassium: 4.6 mEq/L (ref 3.5–5.3)
Sodium: 140 mEq/L (ref 135–145)
Total Bilirubin: 0.4 mg/dL (ref 0.2–1.2)
Total Protein: 7.2 g/dL (ref 6.0–8.3)

## 2013-12-27 LAB — VITAMIN D 25 HYDROXY (VIT D DEFICIENCY, FRACTURES): Vit D, 25-Hydroxy: 42 ng/mL (ref 30–89)

## 2014-02-10 NOTE — Assessment & Plan Note (Signed)
Denies any recent episode of wheezing

## 2014-02-10 NOTE — Assessment & Plan Note (Signed)
Unchnaged. Patient re-educated about  the importance of commitment to a  minimum of 150 minutes of exercise per week. The importance of healthy food choices with portion control discussed. Encouraged to start a food diary, count calories and to consider  joining a support group. Sample diet sheets offered. Goals set by the patient for the next several months.    

## 2014-02-10 NOTE — Assessment & Plan Note (Signed)
Controlled, no change in medication DASH diet and commitment to daily physical activity for a minimum of 30 minutes discussed and encouraged, as a part of hypertension management. The importance of attaining a healthy weight is also discussed.  

## 2014-02-10 NOTE — Assessment & Plan Note (Signed)
Recommend starting metformin daily Patient educated about the importance of limiting  Carbohydrate intake , the need to commit to daily physical activity for a minimum of 30 minutes , and to commit weight loss. The fact that changes in all these areas will reduce or eliminate all together the development of diabetes is stressed.

## 2014-02-10 NOTE — Assessment & Plan Note (Signed)
Improved but LDL still elevated Hyperlipidemia:Low fat diet discussed and encouraged.

## 2014-02-10 NOTE — Progress Notes (Signed)
   Subjective:    Patient ID: Heather [name], female    DOB: [birth year], 73 y.o.   MRN: [id number]  HPI The PT is here for follow up and re-evaluation of chronic medical conditions, medication management and review of any available recent lab and radiology data.  Preventive health is updated, specifically  Cancer screening and Immunization.   Questions or concerns regarding consultations or procedures which the PT has had in the interim are  addressed. The PT denies any adverse reactions to current medications since the last visit.  There are no new concerns.  There are no specific complaints       Review of Systems See HPI Denies recent fever or chills. Denies sinus pressure, nasal congestion, ear pain or sore throat. Denies chest congestion, productive cough or wheezing. Denies chest pains, palpitations and leg swelling Denies abdominal pain, nausea, vomiting,diarrhea or constipation.   Denies dysuria, frequency, hesitancy or incontinence. Denies uncontrolled  joint pain, swelling and limitation in mobility. Denies headaches, seizures, numbness, or tingling. Denies depression, anxiety or insomnia. Denies skin break down or rash.        Objective:   Physical Exam  BP 128/82  Pulse 75  Resp 16  Wt 257 lb 1.9 oz (116.629 kg)  SpO2 99% Patient alert and oriented and in no cardiopulmonary distress.  HEENT: No facial asymmetry, EOMI,   oropharynx pink and moist.  Neck supple no JVD, no mass.  Chest: Clear to auscultation bilaterally.  CVS: S1, S2 no murmurs, no S3.Regular rate.  ABD: Soft non tender.   Ext: No edema  MS: Adequate ROM spine, shoulders, hips and knees.  Skin: Intact, no ulcerations or rash noted.  Psych: Good eye contact, normal affect. Memory intact not anxious or depressed appearing.  CNS: CN 2-12 intact, power,  normal throughout.no focal deficits noted.       Assessment & Plan:  HYPERTENSION Controlled, no change in medication DASH  diet and commitment to daily physical activity for a minimum of 30 minutes discussed and encouraged, as a part of hypertension management. The importance of attaining a healthy weight is also discussed.   ASTHMA Denies any recent episode of wheezing  Prediabetes Recommend starting metformin daily Patient educated about the importance of limiting  Carbohydrate intake , the need to commit to daily physical activity for a minimum of 30 minutes , and to commit weight loss. The fact that changes in all these areas will reduce or eliminate all together the development of diabetes is stressed.     Dyslipidemia Improved but LDL still elevated Hyperlipidemia:Low fat diet discussed and encouraged.    MORBID OBESITY Unchnaged Patient re-educated about  the importance of commitment to a  minimum of 150 minutes of exercise per week. The importance of healthy food choices with portion control discussed. Encouraged to start a food diary, count calories and to consider  joining a support group. Sample diet sheets offered. Goals set by the patient for the next several months.     Metabolic syndrome X The increased risk of cardiovascular disease associated with this diagnosis, and the need to consistently work on lifestyle to change this is discussed. Following  a  heart healthy diet ,commitment to 30 minutes of exercise at least 5 days per week, as well as control of blood sugar and cholesterol , and achieving a healthy weight are all the areas to be addressed .

## 2014-02-10 NOTE — Assessment & Plan Note (Signed)
The increased risk of cardiovascular disease associated with this diagnosis, and the need to consistently work on lifestyle to change this is discussed. Following  a  heart healthy diet ,commitment to 30 minutes of exercise at least 5 days per week, as well as control of blood sugar and cholesterol , and achieving a healthy weight are all the areas to be addressed .  

## 2014-02-24 ENCOUNTER — Other Ambulatory Visit: Payer: Self-pay | Admitting: Family Medicine

## 2014-04-02 ENCOUNTER — Other Ambulatory Visit: Payer: Self-pay | Admitting: Family Medicine

## 2014-04-02 ENCOUNTER — Telehealth: Payer: Self-pay | Admitting: Gastroenterology

## 2014-04-02 DIAGNOSIS — Z1231 Encounter for screening mammogram for malignant neoplasm of breast: Secondary | ICD-10-CM

## 2014-04-02 NOTE — Telephone Encounter (Signed)
PATIENT CALLED WANTING TO SCHEDULE HER COLONOSCOPY [phone]

## 2014-04-03 ENCOUNTER — Other Ambulatory Visit: Payer: Self-pay

## 2014-04-03 ENCOUNTER — Telehealth: Payer: Self-pay

## 2014-04-03 DIAGNOSIS — Z1211 Encounter for screening for malignant neoplasm of colon: Secondary | ICD-10-CM

## 2014-04-03 MED ORDER — PEG-KCL-NACL-NASULF-NA ASC-C 100 G PO SOLR: 1.0000 | ORAL | Status: DC

## 2014-04-03 NOTE — Telephone Encounter (Signed)
MOVI PREP SPLIT DOSING, REGULAR BREAKFAST. CLEAR LIQUIDS AFTER 9 AM.  CONTINUE GLUCOPHAGE.  

## 2014-04-03 NOTE — Telephone Encounter (Signed)
See separate triage.  

## 2014-04-03 NOTE — Telephone Encounter (Signed)
Rx sent to the pharmacy and instructions mailed to pt.  

## 2014-04-03 NOTE — Telephone Encounter (Signed)
Gastroenterology Pre-Procedure Review  Request Date: 04/03/2014 Requesting Physician: Moshe Cipro  PATIENT REVIEW QUESTIONS: The patient responded to the following health history questions as indicated:    1. Diabetes Melitis:YES 2. Joint replacements in the past 12 months: no 3. Major health problems in the past 3 months: no 4. Has an artificial valve or MVP: no 5. Has a defibrillator: no 6. Has been advised in past to take antibiotics in advance of a procedure like teeth cleaning: no    MEDICATIONS & ALLERGIES:    Patient reports the following regarding taking any blood thinners:   Plavix? no Aspirin? no Coumadin? no  Patient confirms/reports the following medications:  Current Outpatient Prescriptions  Medication Sig Dispense Refill  . cholecalciferol (VITAMIN D) 400 UNITS TABS tablet Take 400 Units by mouth daily.      Marland Kitchen losartan-hydrochlorothiazide (HYZAAR) 100-25 MG per tablet TAKE ONE (1) TABLET EACH DAY  30 tablet  2  . metFORMIN (GLUCOPHAGE) 500 MG tablet Take 500 mg by mouth daily with breakfast.      . potassium chloride SA (K-DUR,KLOR-CON) 20 MEQ tablet TAKE ONE (1) TABLET BY MOUTH EVERY DAY  30 tablet  2  . loratadine (CLARITIN) 10 MG tablet Take 1 tablet (10 mg total) by mouth daily.  30 tablet  11   No current facility-administered medications for this visit.    Patient confirms/reports the following allergies:  Allergies  Allergen Reactions  . Aspirin Anaphylaxis and Swelling  . Penicillins Swelling    Causes throat to swell    No orders of the defined types were placed in this encounter.    AUTHORIZATION INFORMATION Primary Insurance:   ID #:  Group #:  Pre-Cert / Auth required: Pre-Cert / Auth #:   Secondary Insurance:   ID #:   Group #:  Pre-Cert / Auth required:  Pre-Cert / Auth #:   SCHEDULE INFORMATION: Procedure has been scheduled as follows:  Date: 04/30/2014                   Time:  9:30 AM Location: [facility] Short Stay  This  Gastroenterology Pre-Precedure Review Form is being routed to the following provider(s): Barney Drain, MD

## 2014-04-11 NOTE — Telephone Encounter (Signed)
Dr. Oneida Alar will have a late start and the patient will need to be at [facility] at 10:30 am. I called the patient, no answer.lmom

## 2014-04-11 NOTE — Telephone Encounter (Signed)
Pt is aware of the time change

## 2014-04-17 ENCOUNTER — Encounter (HOSPITAL_COMMUNITY): Payer: Self-pay | Admitting: Pharmacy Technician

## 2014-04-25 ENCOUNTER — Ambulatory Visit (HOSPITAL_COMMUNITY): Payer: PRIVATE HEALTH INSURANCE

## 2014-04-30 ENCOUNTER — Encounter (HOSPITAL_COMMUNITY): Admission: RE | Payer: Self-pay | Source: Ambulatory Visit

## 2014-04-30 ENCOUNTER — Ambulatory Visit (HOSPITAL_COMMUNITY)
Admission: RE | Admit: 2014-04-30 | Payer: PRIVATE HEALTH INSURANCE | Source: Ambulatory Visit | Admitting: Gastroenterology

## 2014-04-30 SURGERY — COLONOSCOPY
Anesthesia: Moderate Sedation

## 2014-05-02 ENCOUNTER — Encounter: Payer: PRIVATE HEALTH INSURANCE | Admitting: Family Medicine

## 2014-05-30 ENCOUNTER — Encounter (INDEPENDENT_AMBULATORY_CARE_PROVIDER_SITE_OTHER): Payer: Self-pay

## 2014-05-30 ENCOUNTER — Encounter: Payer: Self-pay | Admitting: Family Medicine

## 2014-05-30 ENCOUNTER — Ambulatory Visit (HOSPITAL_COMMUNITY)
Admission: RE | Admit: 2014-05-30 | Discharge: 2014-05-30 | Disposition: A | Discharge status: Home / Self Care | Payer: PRIVATE HEALTH INSURANCE | Source: Ambulatory Visit | Attending: Family Medicine | Admitting: Family Medicine

## 2014-05-30 ENCOUNTER — Ambulatory Visit (INDEPENDENT_AMBULATORY_CARE_PROVIDER_SITE_OTHER): Payer: PRIVATE HEALTH INSURANCE | Admitting: Family Medicine

## 2014-05-30 ENCOUNTER — Ambulatory Visit (INDEPENDENT_AMBULATORY_CARE_PROVIDER_SITE_OTHER): Payer: PRIVATE HEALTH INSURANCE

## 2014-05-30 ENCOUNTER — Other Ambulatory Visit (HOSPITAL_COMMUNITY)
Admission: RE | Admit: 2014-05-30 | Discharge: 2014-05-30 | Disposition: A | Discharge status: Home / Self Care | Payer: PRIVATE HEALTH INSURANCE | Source: Ambulatory Visit | Attending: Family Medicine | Admitting: Family Medicine

## 2014-05-30 VITALS — BP 128/82 | HR 82 | Resp 16 | Ht 63.0 in | Wt 254.1 lb

## 2014-05-30 DIAGNOSIS — Z0000 Encounter for general adult medical examination without abnormal findings: Secondary | ICD-10-CM | POA: Insufficient documentation

## 2014-05-30 DIAGNOSIS — Z23 Encounter for immunization: Secondary | ICD-10-CM

## 2014-05-30 DIAGNOSIS — Z1231 Encounter for screening mammogram for malignant neoplasm of breast: Secondary | ICD-10-CM | POA: Diagnosis present

## 2014-05-30 DIAGNOSIS — E8881 Metabolic syndrome: Secondary | ICD-10-CM

## 2014-05-30 DIAGNOSIS — R7303 Prediabetes: Secondary | ICD-10-CM

## 2014-05-30 DIAGNOSIS — Z1211 Encounter for screening for malignant neoplasm of colon: Secondary | ICD-10-CM

## 2014-05-30 DIAGNOSIS — Z124 Encounter for screening for malignant neoplasm of cervix: Secondary | ICD-10-CM | POA: Diagnosis present

## 2014-05-30 DIAGNOSIS — I10 Essential (primary) hypertension: Secondary | ICD-10-CM

## 2014-05-30 DIAGNOSIS — K8020 Calculus of gallbladder without cholecystitis without obstruction: Secondary | ICD-10-CM | POA: Insufficient documentation

## 2014-05-30 DIAGNOSIS — R1901 Right upper quadrant abdominal swelling, mass and lump: Secondary | ICD-10-CM

## 2014-05-30 HISTORY — DX: Calculus of gallbladder without cholecystitis without obstruction: K80.20

## 2014-05-30 HISTORY — DX: Encounter for general adult medical examination without abnormal findings: Z00.00

## 2014-05-30 LAB — HEMOCCULT GUIAC POC 1CARD (OFFICE): Fecal Occult Blood, POC: NEGATIVE

## 2014-05-30 NOTE — Assessment & Plan Note (Signed)
Vaccine administered at visit.  

## 2014-05-30 NOTE — Patient Instructions (Signed)
F/u in 4 month, pls call if you need me before  Flu vaccine today  You are referred for a scan of your abdomen to evaluate Swelling in right upper area  HBA1C, chem 7 and EGFr and TSH today  Check pharmacy for shingles vaccine  Pls reschedule your colonoscopy, this is very important  Work on diet low in fat and sugar and starchy food and commit to daily exercise for health  Fasting lipid, cmp , hBA1C in 4 month  All the best for 2016!

## 2014-05-30 NOTE — Progress Notes (Signed)
   Subjective:    Patient ID: Heather [name], female    DOB: 1940-10-20, 73 y.o.   MRN: [id number]  HPI Patient is in for pelvic and breast exam. No other health concerns are expressed or addressed at the visit.    Review of Systems See HPI     Objective:   Physical Exam  BP 128/82 mmHg  Pulse 82  Resp 16  Ht 5\' 3"  (1.6 m)  Wt 254 lb 1.9 oz (115.268 kg)  BMI 45.03 kg/m2  SpO2 97%  Pleasant morbidly obese female, alert and oriented x 3, in no cardio-pulmonary distress. Afebrile. HEENT No facial trauma or asymetry. Sinuses non tender.  Extra occullar muscles intact, pupils equally reactive to light. External ears normal, tympanic membranes clear. Oropharynx moist, no exudate, poor dentition. Neck: supple, no adenopathy,JVD or thyromegaly.No bruits.  Chest: Clear to ascultation bilaterally.No crackles or wheezes. Non tender to palpation  Breast: Left mastectomy. No tenderness. No nipple discharge or inversion. No axillary or supraclavicular adenopathy Right breast no mass, nipple discharge or inversion  Cardiovascular system; Heart sounds normal,  S1 and  S2 ,no S3.  No murmur, or thrill. Apical beat not displaced Peripheral pulses normal.  Abdomen: Soft, non tender, no non tender RUQ mass No bruits. Bowel sounds normal. No guarding, tenderness or rebound.  Rectal:  Normal sphincter tone. No mass.No rectal masses.  Guaiac negative stool.  GU: External genitalia normal female genitalia , female distribution of hair. No lesions. Urethral meatus normal in size, no  Prolapse, no lesions visibly  Present. Bladder non tender. Vagina pink and moist , with no visible lesions , discharge present . Adequate pelvic support no  cystocele or rectocele noted Cervix pink and appears healthy, no lesions or ulcerations noted, no discharge noted from os Uterus absent, no adnexal masses, no  adnexal tenderness.   Musculoskeletal exam: Decreased though adequate  ROM of  spine, hips , shoulders and knees. No deformity ,swelling or crepitus noted. No muscle wasting or atrophy.   Neurologic: Cranial nerves 2 to 12 intact. Power, tone ,sensation and reflexes normal throughout. No disturbance in gait. No tremor.  Skin: Intact, no ulceration, erythema , scaling or rash noted. Pigmentation normal throughout  Psych; Normal mood and affect. Judgement and concentration normal       Assessment & Plan:  Annual physical exam Annual exam as documented. Counseling done  re healthy lifestyle involving commitment to 150 minutes exercise per week, heart healthy diet, and attaining healthy weight.The importance of adequate sleep also discussed. Regular seat belt use and home safety, is also discussed. Changes in health habits are decided on by the patient with goals and time frames  set for achieving them. Immunization and cancer screening needs are specifically addressed at this visit.   Need for prophylactic vaccination and inoculation against influenza Vaccine administered at visit.   Abdominal mass, right upper quadrant Refer for imaging study to further evaluate, some limitation in exam due to body habitus, however, fullness in RUQ present

## 2014-05-30 NOTE — Assessment & Plan Note (Signed)

## 2014-05-31 ENCOUNTER — Ambulatory Visit (HOSPITAL_COMMUNITY): Payer: PRIVATE HEALTH INSURANCE

## 2014-05-31 LAB — BASIC METABOLIC PANEL WITH GFR
BUN: 16 mg/dL (ref 6–23)
CO2: 28 mEq/L (ref 19–32)
Calcium: 9.4 mg/dL (ref 8.4–10.5)
Chloride: 99 mEq/L (ref 96–112)
Creat: 0.95 mg/dL (ref 0.50–1.10)
GFR, Est African American: 69 mL/min
GFR, Est Non African American: 60 mL/min
Glucose, Bld: 87 mg/dL (ref 70–99)
Potassium: 4.4 mEq/L (ref 3.5–5.3)
Sodium: 138 mEq/L (ref 135–145)

## 2014-05-31 LAB — HEMOGLOBIN A1C
Hgb A1c MFr Bld: 5.9 % — ABNORMAL HIGH (ref <5.7)
Mean Plasma Glucose: 123 mg/dL — ABNORMAL HIGH (ref <117)

## 2014-05-31 LAB — CYTOLOGY - PAP

## 2014-05-31 LAB — TSH: TSH: 4.243 u[IU]/mL (ref 0.350–4.500)

## 2014-06-06 ENCOUNTER — Ambulatory Visit (HOSPITAL_COMMUNITY): Payer: PRIVATE HEALTH INSURANCE

## 2014-06-19 ENCOUNTER — Ambulatory Visit (HOSPITAL_COMMUNITY)
Admission: RE | Admit: 2014-06-19 | Discharge: 2014-06-19 | Disposition: A | Discharge status: Home / Self Care | Payer: PRIVATE HEALTH INSURANCE | Source: Ambulatory Visit | Attending: Family Medicine | Admitting: Family Medicine

## 2014-06-19 DIAGNOSIS — D1809 Hemangioma of other sites: Secondary | ICD-10-CM | POA: Diagnosis not present

## 2014-06-19 DIAGNOSIS — M5137 Other intervertebral disc degeneration, lumbosacral region: Secondary | ICD-10-CM | POA: Diagnosis not present

## 2014-06-19 DIAGNOSIS — K7689 Other specified diseases of liver: Secondary | ICD-10-CM | POA: Diagnosis not present

## 2014-06-19 DIAGNOSIS — K8020 Calculus of gallbladder without cholecystitis without obstruction: Secondary | ICD-10-CM | POA: Diagnosis not present

## 2014-06-19 DIAGNOSIS — R1901 Right upper quadrant abdominal swelling, mass and lump: Secondary | ICD-10-CM

## 2014-06-19 MED ORDER — IOHEXOL 300 MG/ML  SOLN
100.0000 mL | Freq: Once | INTRAMUSCULAR | Status: AC | PRN
  Administered 2014-06-19: 100 mL via INTRAVENOUS

## 2014-06-25 ENCOUNTER — Other Ambulatory Visit: Payer: Self-pay

## 2014-06-25 DIAGNOSIS — K8020 Calculus of gallbladder without cholecystitis without obstruction: Secondary | ICD-10-CM

## 2014-07-15 NOTE — Assessment & Plan Note (Signed)
Refer for imaging study to further evaluate, some limitation in exam due to body habitus, however, fullness in RUQ present

## 2014-07-16 ENCOUNTER — Encounter: Payer: PRIVATE HEALTH INSURANCE | Admitting: Family Medicine

## 2014-07-31 DIAGNOSIS — K8020 Calculus of gallbladder without cholecystitis without obstruction: Secondary | ICD-10-CM | POA: Diagnosis not present

## 2014-08-10 ENCOUNTER — Other Ambulatory Visit: Payer: Self-pay | Admitting: Family Medicine

## 2014-09-14 ENCOUNTER — Other Ambulatory Visit: Payer: Self-pay | Admitting: Family Medicine

## 2014-10-02 ENCOUNTER — Ambulatory Visit: Payer: Medicaid Other | Admitting: Family Medicine

## 2014-10-02 ENCOUNTER — Encounter: Payer: Self-pay | Admitting: Family Medicine

## 2014-10-04 ENCOUNTER — Ambulatory Visit (HOSPITAL_COMMUNITY): Payer: PRIVATE HEALTH INSURANCE | Admitting: Oncology

## 2014-10-04 NOTE — Assessment & Plan Note (Signed)
Stage II B. 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 All therapy finished as of September 1998.  No oncology role for labs today.  Next screening mammogram is due in November 2016.  She is a candidate for release from [facility] since she is nearly 20 years out from her breast cancer, but she wishes to continue with annual follow-up.  Return in 12 months for follow-up.

## 2014-10-04 NOTE — Progress Notes (Signed)
-  No show-  Heather [name]  

## 2014-10-11 ENCOUNTER — Encounter (HOSPITAL_COMMUNITY): Payer: Self-pay

## 2014-11-11 NOTE — Assessment & Plan Note (Addendum)
stage II B. 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 All therapy finished as of September 1998.  She is up to date on mammogram and she is due for her next mammogram this fall.  No role for labs from an oncology standpoint at this time.  Continue follow-up with primary care provider as directed.  Return in 12 months for follow-up.

## 2014-11-11 NOTE — Progress Notes (Signed)
Heather Nakayama, MD [address] [place] [address]  History of Invasive medullary carcinoma of left breast  CURRENT THERAPY: Surveillance per NCCN guidelines  INTERVAL HISTORY: Heather [name] 74 y.o. female returns for followup of stage II B. 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 All therapy finished as of September 1998.  Oncology History   stage II B. 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 All therapy finished as of September 1998      History of Invasive medullary carcinoma of left breast   05/30/1996 Surgery Modified radical mastectomy demonstrating a Stage IIB (T2N1M0) medullary carcinoma of left breast with 4/13 positive lymph nodes.  ER/PR negative.   08/07/1996 - 01/01/1997 Chemotherapy CMFx 6 cycles   01/24/1997 - 04/16/1997 Radiation Therapy Approximate start and completion dates.  A few breaks in radiation therapy due to desquamation    I personally reviewed and went over laboratory results with the patient.  The results are noted within this dictation.  I personally reviewed and went over radiographic studies with the patient.  The results are noted within this dictation.  Her next mammogram is due this coming Fall.  She is active helping care for her grandchild.  She reports that she is doing very well without any complaints oncologically and negative ROS questioning.   Past Medical History  Diagnosis Date  . GERD (gastroesophageal reflux disease)   . Asthma   . Congestive heart failure   . FH: CAD (coronary artery disease)   . Heart failure   .  Personal history of malignant neoplasm of breast   . Breast cancer 1998    left  . Hypertension 1998  . 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     is allergic to aspirin and penicillins.  Heather [name] does not currently have medications on file.  Past Surgical History  Procedure Laterality Date  . Mastectomy  1997    left breast   . Vesicovaginal fistula closure w/ tah  1971  . Total abdominal hysterectomy w/ bilateral salpingoophorectomy  1971    unilateral   . Abdominal hysterectomy  1973    fibroids  . Breast surgery Left 1998    breast ca    Denies any headaches, dizziness, double vision, fevers, chills, night sweats, nausea, vomiting, diarrhea, constipation, chest pain, heart palpitations, shortness of breath, blood in stool, black tarry stool, urinary pain, urinary burning, urinary frequency, hematuria.   PHYSICAL EXAMINATION  ECOG PERFORMANCE STATUS: 0 - Asymptomatic  Filed Vitals:   11/14/14 1209  BP: 157/68  Pulse: 77  Temp: 98.3 F (36.8 C)  Resp: 18    GENERAL:alert, no distress, well nourished,  well developed, comfortable, cooperative, obese and smiling SKIN: skin color, texture, turgor are normal, no rashes or significant lesions HEAD: Normocephalic, No masses, lesions, tenderness or abnormalities EYES: normal, PERRLA, EOMI, Conjunctiva are pink and non-injected EARS: External ears normal OROPHARYNX:lips, buccal mucosa, and tongue normal and  mucous membranes are moist  NECK: supple, no adenopathy, thyroid normal size, non-tender, without nodularity, no stridor, non-tender, trachea midline LYMPH:  no palpable lymphadenopathy, no hepatosplenomegaly BREAST:not examined LUNGS: clear to auscultation and percussion HEART: regular rate & rhythm, no murmurs, no gallops, S1 normal and S2 normal ABDOMEN:abdomen soft, non-tender, obese, normal bowel sounds and no masses or organomegaly BACK: Back symmetric, no curvature., No CVA tenderness EXTREMITIES:less then 2 second capillary refill, no joint deformities, effusion, or inflammation, no skin discoloration, no clubbing, no cyanosis  NEURO: alert & oriented x 3 with fluent speech, no focal motor/sensory deficits, gait normal    LABORATORY DATA: CBC    Component Value Date/Time   WBC 9.3 08/25/2013 1111   RBC 4.58 08/25/2013 1111   HGB 13.8 08/25/2013 1111   HCT 41.0 08/25/2013 1111   PLT 388 08/25/2013 1111   MCV 89.5 08/25/2013 1111   MCH 30.1 08/25/2013 1111   MCHC 33.7 08/25/2013 1111   RDW 13.8 08/25/2013 1111   LYMPHSABS 2.8 08/25/2013 1111   MONOABS 0.7 08/25/2013 1111   EOSABS 0.4 08/25/2013 1111   BASOSABS 0.0 08/25/2013 1111      Chemistry      Component Value Date/Time   NA 138 05/30/2014 1359   K 4.4 05/30/2014 1359   CL 99 05/30/2014 1359   CO2 28 05/30/2014 1359   BUN 16 05/30/2014 1359   CREATININE 0.95 05/30/2014 1359   CREATININE 0.85 12/12/2010 1335      Component Value Date/Time   CALCIUM 9.4 05/30/2014 1359   ALKPHOS 78 12/26/2013 1119   AST 12 12/26/2013 1119   ALT <8 12/26/2013 1119   BILITOT 0.4 12/26/2013 1119        ASSESSMENT AND PLAN:  History of Invasive medullary carcinoma of left breast stage II B. 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  All therapy finished as of September 1998.  She is up to date on mammogram and she is due for her next mammogram this fall.  No role for labs from an oncology standpoint at this time.  Continue follow-up with primary care provider as directed.  Return in 12 months for follow-up.       THERAPY PLAN:  NCCN guidelines recommends the following surveillance for invasive breast cancer:  A. History and Physical exam every 4-6 months for 5 years and then every 12 months.  B. Mammography every 12 months  C. Women on Tamoxifen: annual gynecologic assessment every 12 months if uterus is present.  D. Women on aromatase inhibitor or who experience ovarian failure secondary to treatment should have monitoring of bone health with a bone mineral density determination at baseline and periodically thereafter.  E. Assess and encourage adherence to adjuvant endocrine therapy.  F. Evidence suggests that active lifestyle and achieving and maintaining an ideal body weight (20-25 BMI) may lead to optimal breast cancer outcomes.   All questions were answered. The patient knows to call the clinic with any problems, questions or concerns. We can certainly see the patient much sooner if necessary.  Patient and plan discussed with Dr. Ancil Linsey and she is in agreement with the aforementioned.   This note is electronically signed by: Robynn Pane 11/14/2014 12:35 PM

## 2014-11-14 ENCOUNTER — Encounter (HOSPITAL_COMMUNITY): Payer: Self-pay | Admitting: Oncology

## 2014-11-14 ENCOUNTER — Ambulatory Visit (INDEPENDENT_AMBULATORY_CARE_PROVIDER_SITE_OTHER): Payer: Medicare Other | Admitting: Family Medicine

## 2014-11-14 ENCOUNTER — Encounter: Payer: Self-pay | Admitting: Family Medicine

## 2014-11-14 ENCOUNTER — Encounter (HOSPITAL_COMMUNITY): Payer: Medicare Other | Attending: Oncology | Admitting: Oncology

## 2014-11-14 VITALS — BP 134/84 | HR 76 | Resp 18 | Ht 63.0 in | Wt 251.0 lb

## 2014-11-14 VITALS — BP 157/68 | HR 77 | Temp 98.3°F | Resp 18 | Wt 250.0 lb

## 2014-11-14 DIAGNOSIS — R7309 Other abnormal glucose: Secondary | ICD-10-CM | POA: Diagnosis not present

## 2014-11-14 DIAGNOSIS — Z08 Encounter for follow-up examination after completed treatment for malignant neoplasm: Secondary | ICD-10-CM

## 2014-11-14 DIAGNOSIS — I10 Essential (primary) hypertension: Secondary | ICD-10-CM

## 2014-11-14 DIAGNOSIS — R7303 Prediabetes: Secondary | ICD-10-CM

## 2014-11-14 DIAGNOSIS — E785 Hyperlipidemia, unspecified: Secondary | ICD-10-CM

## 2014-11-14 DIAGNOSIS — Z853 Personal history of malignant neoplasm of breast: Secondary | ICD-10-CM | POA: Diagnosis not present

## 2014-11-14 DIAGNOSIS — Z1211 Encounter for screening for malignant neoplasm of colon: Secondary | ICD-10-CM | POA: Diagnosis not present

## 2014-11-14 DIAGNOSIS — E8881 Metabolic syndrome: Secondary | ICD-10-CM

## 2014-11-14 DIAGNOSIS — C50912 Malignant neoplasm of unspecified site of left female breast: Secondary | ICD-10-CM

## 2014-11-14 LAB — COMPLETE METABOLIC PANEL WITH GFR
ALT: 8 U/L (ref 0–35)
AST: 15 U/L (ref 0–37)
Albumin: 3.8 g/dL (ref 3.5–5.2)
Alkaline Phosphatase: 79 U/L (ref 39–117)
BUN: 21 mg/dL (ref 6–23)
CO2: 28 mEq/L (ref 19–32)
Calcium: 9.2 mg/dL (ref 8.4–10.5)
Chloride: 97 mEq/L (ref 96–112)
Creat: 1 mg/dL (ref 0.50–1.10)
GFR, Est African American: 65 mL/min
GFR, Est Non African American: 56 mL/min — ABNORMAL LOW
Glucose, Bld: 100 mg/dL — ABNORMAL HIGH (ref 70–99)
Potassium: 3.9 mEq/L (ref 3.5–5.3)
Sodium: 137 mEq/L (ref 135–145)
Total Bilirubin: 0.6 mg/dL (ref 0.2–1.2)
Total Protein: 7.7 g/dL (ref 6.0–8.3)

## 2014-11-14 LAB — HEMOGLOBIN A1C
Hgb A1c MFr Bld: 5.9 % — ABNORMAL HIGH (ref <5.7)
Mean Plasma Glucose: 123 mg/dL — ABNORMAL HIGH (ref <117)

## 2014-11-14 LAB — LIPID PANEL
Cholesterol: 179 mg/dL (ref 0–200)
HDL: 44 mg/dL — ABNORMAL LOW (ref >=46)
LDL Cholesterol: 116 mg/dL — ABNORMAL HIGH (ref 0–99)
Total CHOL/HDL Ratio: 4.1 Ratio
Triglycerides: 94 mg/dL (ref <150)
VLDL: 19 mg/dL (ref 0–40)

## 2014-11-14 LAB — TSH: TSH: 4.016 u[IU]/mL (ref 0.350–4.500)

## 2014-11-14 NOTE — Progress Notes (Signed)
Subjective:    Patient ID: Heather [name], female    DOB: [birth year], 74 y.o.   MRN: [id number]  HPI The PT is here for follow up and re-evaluation of chronic medical conditions, medication management and review of any available recent lab and radiology data.  Preventive health is updated, specifically  Cancer screening and Immunization.   Questions or concerns regarding consultations or procedures which the PT has had in the interim are  addressed. The PT denies any adverse reactions to current medications since the last visit.  2 week h/o intermittent chills , mainly at nigth, no respiratory symptoms, no urinary symptoms     Review of Systems    See HPI  Denies sinus pressure, nasal congestion, ear pain or sore throat. Denies chest congestion, productive cough or wheezing. Denies chest pains, palpitations and leg swelling Denies abdominal pain, nausea, vomiting,diarrhea or constipation.   Denies dysuria, frequency, hesitancy or incontinence. Denies uncontrolled  joint pain, swelling and limitation in mobility. Denies headaches, seizures, numbness, or tingling. Denies depression, anxiety or insomnia. Denies skin break down or rash.     Objective:   Physical Exam BP 134/84 mmHg  Pulse 76  Resp 18  Ht 5\' 3"  (1.6 m)  Wt 251 lb (113.853 kg)  BMI 44.47 kg/m2  SpO2 94% Patient alert and oriented and in no cardiopulmonary distress.  HEENT: No facial asymmetry, EOMI,   oropharynx pink and moist.  Neck supple no JVD, no mass.  Chest: Clear to auscultation bilaterally.  CVS: S1, S2 no murmurs, no S3.Regular rate.  ABD: Soft non tender.   Ext: No edema  MS: Adequate ROM spine, shoulders, hips and knees.  Skin: Intact, no ulcerations or rash noted.  Psych: Good eye contact, normal affect. Memory intact not anxious or depressed appearing.  CNS: CN 2-12 intact, power,  normal throughout.no focal deficits noted.        Assessment & Plan:  Essential  hypertension Controlled, no change in medication DASH diet and commitment to daily physical activity for a minimum of 30 minutes discussed and encouraged, as a part of hypertension management. The importance of attaining a healthy weight is also discussed.  BP/Weight 11/14/2014 11/14/2014 05/30/2014 12/26/2013 10/03/2013 08/25/2013 1/49/7026  Systolic BP 378 588 502 774 128 786 767  Diastolic BP 68 84 82 82 82 82 84  Wt. (Lbs) 250 251 254.12 257.12 255.5 255 264.4  BMI 44.3 44.47 45.03 45.56 45.27 45.18 46.85        Prediabetes Unchanged  Patient educated about the importance of limiting  Carbohydrate intake , the need to commit to daily physical activity for a minimum of 30 minutes , and to commit weight loss. The fact that changes in all these areas will reduce or eliminate all together the development of diabetes is stressed.   Diabetic Labs Latest Ref Rng 11/14/2014 05/30/2014 12/26/2013 08/25/2013 04/01/2013  HbA1c <5.7 % 5.9(H) 5.9(H) 5.9(H) 6.0(H) 6.1(H)  Chol 0 - 200 mg/dL 179 - - 167 176  HDL >=46 mg/dL 44(L) - - 40 40  Calc LDL 0 - 99 mg/dL 116(H) - - 107(H) 118(H)  Triglycerides <150 mg/dL 94 - - 101 92  Creatinine 0.50 - 1.10 mg/dL 1.00 0.95 0.88 0.86 0.90   BP/Weight 11/14/2014 11/14/2014 05/30/2014 12/26/2013 10/03/2013 08/25/2013 08/29/4707  Systolic BP 628 366 294 765 465 035 465  Diastolic BP 68 84 82 82 82 82 84  Wt. (Lbs) 250 251 254.12 257.12 255.5 255 264.4  BMI 44.3 44.47 45.03  45.56 45.27 45.18 46.85   No flowsheet data found.     Dyslipidemia Uncontrolled Hyperlipidemia:Low fat diet discussed and encouraged.   Lipid Panel  Lab Results  Component Value Date   CHOL 179 11/14/2014   HDL 44* 11/14/2014   LDLCALC 116* 11/14/2014   TRIG 94 11/14/2014   CHOLHDL 4.1 01/77/9390        Metabolic syndrome X The increased risk of cardiovascular disease associated with this diagnosis, and the need to consistently work on lifestyle to change this is  discussed. Following  a  heart healthy diet ,commitment to 30 minutes of exercise at least 5 days per week, as well as control of blood sugar and cholesterol , and achieving a healthy weight are all the areas to be addressed .   MORBID OBESITY Slightly improved. Patient re-educated about  the importance of commitment to a  minimum of 150 minutes of exercise per week.  The importance of healthy food choices with portion control discussed. Encouraged to start a food diary, count calories and to consider  joining a support group. Sample diet sheets offered. Goals set by the patient for the next several months.   Weight /BMI 11/14/2014 11/14/2014 05/30/2014  WEIGHT 250 lb 251 lb 254 lb 1.9 oz  HEIGHT - 5\' 3"  5\' 3"   BMI 44.3 kg/m2 44.47 kg/m2 45.03 kg/m2    Current exercise per week 90 minutes.

## 2014-11-14 NOTE — Patient Instructions (Addendum)
Annual wellness in 4 month, call if you need  Me before please   Labs today lipid, cmp and EGFr, hBA1C and TSH  Keep active and keep working on weight loss  You are referred for colonoscopy, you NEED this so please keep appt as you say that you will  Thanks for choosing [facility], we consider it a privelige to serve you.

## 2014-11-14 NOTE — Patient Instructions (Signed)
[place] at [facility] Discharge Instructions  RECOMMENDATIONS MADE BY THE CONSULTANT AND ANY TEST RESULTS WILL BE SENT TO YOUR REFERRING PHYSICIAN.  Exam and discussion by Robynn Pane, PA-C Mammogram in November Report any new lumps, bone pain, shortness of breath or other symptoms.  Follow-up in 1 year.  Thank you for choosing [place] at [facility] to provide your oncology and hematology care.  To afford each patient quality time with our provider, please arrive at least 15 minutes before your scheduled appointment time.    You need to re-schedule your appointment should you arrive 10 or more minutes late.  We strive to give you quality time with our providers, and arriving late affects you and other patients whose appointments are after yours.  Also, if you no show three or more times for appointments you may be dismissed from the clinic at the providers discretion.     Again, thank you for choosing [facility].  Our hope is that these requests will decrease the amount of time that you wait before being seen by our physicians.       _____________________________________________________________  Should you have questions after your visit to [facility], please contact our office at [phone] between the hours of 8:30 a.m. and 4:30 p.m.  Voicemails left after 4:30 p.m. will not be returned until the following business day.  For prescription refill requests, have your pharmacy contact our office.

## 2014-11-20 ENCOUNTER — Telehealth: Payer: Self-pay

## 2014-11-20 NOTE — Telephone Encounter (Signed)
Pt was referred by Dr. Moshe Cipro for a colonoscopy. She had previously been scheduled for one in 08/2013 with Dr. Oneida Alar and cancelled. I called [phone] and number not working. I called [phone] and [facility] for a return call. Also, mailing a letter for her to call.

## 2014-12-31 NOTE — Assessment & Plan Note (Signed)
The increased risk of cardiovascular disease associated with this diagnosis, and the need to consistently work on lifestyle to change this is discussed. Following  a  heart healthy diet ,commitment to 30 minutes of exercise at least 5 days per week, as well as control of blood sugar and cholesterol , and achieving a healthy weight are all the areas to be addressed .  

## 2014-12-31 NOTE — Assessment & Plan Note (Signed)
Unchanged  Patient educated about the importance of limiting  Carbohydrate intake , the need to commit to daily physical activity for a minimum of 30 minutes , and to commit weight loss. The fact that changes in all these areas will reduce or eliminate all together the development of diabetes is stressed.   Diabetic Labs Latest Ref Rng 11/14/2014 05/30/2014 12/26/2013 08/25/2013 04/01/2013  HbA1c <5.7 % 5.9(H) 5.9(H) 5.9(H) 6.0(H) 6.1(H)  Chol 0 - 200 mg/dL 179 - - 167 176  HDL >=46 mg/dL 44(L) - - 40 40  Calc LDL 0 - 99 mg/dL 116(H) - - 107(H) 118(H)  Triglycerides <150 mg/dL 94 - - 101 92  Creatinine 0.50 - 1.10 mg/dL 1.00 0.95 0.88 0.86 0.90   BP/Weight 11/14/2014 11/14/2014 05/30/2014 12/26/2013 10/03/2013 08/25/2013 7/94/8016  Systolic BP 553 748 270 786 754 492 010  Diastolic BP 68 84 82 82 82 82 84  Wt. (Lbs) 250 251 254.12 257.12 255.5 255 264.4  BMI 44.3 44.47 45.03 45.56 45.27 45.18 46.85   No flowsheet data found.

## 2014-12-31 NOTE — Assessment & Plan Note (Signed)
Uncontrolled Hyperlipidemia:Low fat diet discussed and encouraged.   Lipid Panel  Lab Results  Component Value Date   CHOL 179 11/14/2014   HDL 44* 11/14/2014   LDLCALC 116* 11/14/2014   TRIG 94 11/14/2014   CHOLHDL 4.1 11/14/2014

## 2014-12-31 NOTE — Assessment & Plan Note (Signed)
Slightly improved. Patient re-educated about  the importance of commitment to a  minimum of 150 minutes of exercise per week.  The importance of healthy food choices with portion control discussed. Encouraged to start a food diary, count calories and to consider  joining a support group. Sample diet sheets offered. Goals set by the patient for the next several months.   Weight /BMI 11/14/2014 11/14/2014 05/30/2014  WEIGHT 250 lb 251 lb 254 lb 1.9 oz  HEIGHT - 5\' 3"  5\' 3"   BMI 44.3 kg/m2 44.47 kg/m2 45.03 kg/m2    Current exercise per week 90 minutes.

## 2014-12-31 NOTE — Assessment & Plan Note (Signed)
Controlled, no change in medication DASH diet and commitment to daily physical activity for a minimum of 30 minutes discussed and encouraged, as a part of hypertension management. The importance of attaining a healthy weight is also discussed.  BP/Weight 11/14/2014 11/14/2014 05/30/2014 12/26/2013 10/03/2013 08/25/2013 4/82/5003  Systolic BP 704 888 916 945 038 882 800  Diastolic BP 68 84 82 82 82 82 84  Wt. (Lbs) 250 251 254.12 257.12 255.5 255 264.4  BMI 44.3 44.47 45.03 45.56 45.27 45.18 46.85

## 2015-01-08 ENCOUNTER — Other Ambulatory Visit: Payer: Self-pay | Admitting: Family Medicine

## 2015-02-09 ENCOUNTER — Other Ambulatory Visit: Payer: Self-pay | Admitting: Family Medicine

## 2015-03-21 ENCOUNTER — Other Ambulatory Visit: Payer: Self-pay | Admitting: Family Medicine

## 2015-04-12 ENCOUNTER — Emergency Department (HOSPITAL_COMMUNITY): Payer: Medicare Other

## 2015-04-12 ENCOUNTER — Encounter (HOSPITAL_COMMUNITY): Payer: Self-pay | Admitting: *Deleted

## 2015-04-12 ENCOUNTER — Telehealth: Payer: Self-pay

## 2015-04-12 ENCOUNTER — Emergency Department (HOSPITAL_COMMUNITY)
Admission: EM | Admit: 2015-04-12 | Discharge: 2015-04-12 | Disposition: A | Discharge status: Home / Self Care | Payer: Medicare Other | Attending: Emergency Medicine | Admitting: Emergency Medicine

## 2015-04-12 DIAGNOSIS — R200 Anesthesia of skin: Secondary | ICD-10-CM | POA: Diagnosis not present

## 2015-04-12 DIAGNOSIS — R197 Diarrhea, unspecified: Secondary | ICD-10-CM | POA: Insufficient documentation

## 2015-04-12 DIAGNOSIS — J45909 Unspecified asthma, uncomplicated: Secondary | ICD-10-CM | POA: Diagnosis not present

## 2015-04-12 DIAGNOSIS — I509 Heart failure, unspecified: Secondary | ICD-10-CM | POA: Insufficient documentation

## 2015-04-12 DIAGNOSIS — Z87891 Personal history of nicotine dependence: Secondary | ICD-10-CM | POA: Diagnosis not present

## 2015-04-12 DIAGNOSIS — R1084 Generalized abdominal pain: Secondary | ICD-10-CM | POA: Diagnosis not present

## 2015-04-12 DIAGNOSIS — I10 Essential (primary) hypertension: Secondary | ICD-10-CM | POA: Diagnosis not present

## 2015-04-12 DIAGNOSIS — Z90710 Acquired absence of both cervix and uterus: Secondary | ICD-10-CM | POA: Diagnosis not present

## 2015-04-12 DIAGNOSIS — K219 Gastro-esophageal reflux disease without esophagitis: Secondary | ICD-10-CM | POA: Diagnosis not present

## 2015-04-12 DIAGNOSIS — Z853 Personal history of malignant neoplasm of breast: Secondary | ICD-10-CM | POA: Diagnosis not present

## 2015-04-12 DIAGNOSIS — Z79899 Other long term (current) drug therapy: Secondary | ICD-10-CM | POA: Insufficient documentation

## 2015-04-12 DIAGNOSIS — K760 Fatty (change of) liver, not elsewhere classified: Secondary | ICD-10-CM | POA: Diagnosis not present

## 2015-04-12 DIAGNOSIS — Z880 Allergy status to penicillin: Secondary | ICD-10-CM | POA: Insufficient documentation

## 2015-04-12 DIAGNOSIS — R109 Unspecified abdominal pain: Secondary | ICD-10-CM

## 2015-04-12 DIAGNOSIS — I2510 Atherosclerotic heart disease of native coronary artery without angina pectoris: Secondary | ICD-10-CM | POA: Insufficient documentation

## 2015-04-12 DIAGNOSIS — K5730 Diverticulosis of large intestine without perforation or abscess without bleeding: Secondary | ICD-10-CM | POA: Diagnosis not present

## 2015-04-12 LAB — COMPREHENSIVE METABOLIC PANEL
ALT: 10 U/L — ABNORMAL LOW (ref 14–54)
AST: 17 U/L (ref 15–41)
Albumin: 3.6 g/dL (ref 3.5–5.0)
Alkaline Phosphatase: 82 U/L (ref 38–126)
Anion gap: 7 (ref 5–15)
BUN: 20 mg/dL (ref 6–20)
CO2: 31 mmol/L (ref 22–32)
Calcium: 8.8 mg/dL — ABNORMAL LOW (ref 8.9–10.3)
Chloride: 100 mmol/L — ABNORMAL LOW (ref 101–111)
Creatinine, Ser: 0.98 mg/dL (ref 0.44–1.00)
GFR calc Af Amer: >60 mL/min (ref >60)
GFR calc non Af Amer: 56 mL/min — ABNORMAL LOW (ref >60)
Glucose, Bld: 112 mg/dL — ABNORMAL HIGH (ref 65–99)
Potassium: 3.2 mmol/L — ABNORMAL LOW (ref 3.5–5.1)
Sodium: 138 mmol/L (ref 135–145)
Total Bilirubin: 0.5 mg/dL (ref 0.3–1.2)
Total Protein: 7.9 g/dL (ref 6.5–8.1)

## 2015-04-12 LAB — CBC WITH DIFFERENTIAL/PLATELET
Basophils Absolute: 0 K/uL (ref 0.0–0.1)
Basophils Relative: 0 %
Eosinophils Absolute: 0.1 K/uL (ref 0.0–0.7)
Eosinophils Relative: 1 %
HCT: 38.3 % (ref 36.0–46.0)
Hemoglobin: 12.6 g/dL (ref 12.0–15.0)
Lymphocytes Relative: 31 %
Lymphs Abs: 3 K/uL (ref 0.7–4.0)
MCH: 30.3 pg (ref 26.0–34.0)
MCHC: 32.9 g/dL (ref 30.0–36.0)
MCV: 92.1 fL (ref 78.0–100.0)
Monocytes Absolute: 0.7 K/uL (ref 0.1–1.0)
Monocytes Relative: 7 %
Neutro Abs: 6 K/uL (ref 1.7–7.7)
Neutrophils Relative %: 61 %
Platelets: 361 K/uL (ref 150–400)
RBC: 4.16 MIL/uL (ref 3.87–5.11)
RDW: 14.8 % (ref 11.5–15.5)
WBC: 9.8 K/uL (ref 4.0–10.5)

## 2015-04-12 LAB — LIPASE, BLOOD: Lipase: 23 U/L (ref 22–51)

## 2015-04-12 MED ORDER — IOHEXOL 300 MG/ML  SOLN
100.0000 mL | Freq: Once | INTRAMUSCULAR | Status: AC | PRN
  Administered 2015-04-12: 100 mL via INTRAVENOUS

## 2015-04-12 MED ORDER — LOPERAMIDE HCL 2 MG PO CAPS: 2.0000 mg | ORAL_CAPSULE | Freq: Four times a day (QID) | ORAL | Status: DC | PRN

## 2015-04-12 MED ORDER — SODIUM CHLORIDE 0.9 % IV BOLUS (SEPSIS)
500.0000 mL | Freq: Once | INTRAVENOUS | Status: AC
  Administered 2015-04-12: 500 mL via INTRAVENOUS

## 2015-04-12 MED ORDER — SODIUM CHLORIDE 0.9 % IJ SOLN
INTRAMUSCULAR | Status: AC
  Filled 2015-04-12: qty 60

## 2015-04-12 MED ORDER — IOHEXOL 300 MG/ML  SOLN
25.0000 mL | Freq: Once | INTRAMUSCULAR | Status: AC | PRN
  Administered 2015-04-12: 25 mL via ORAL

## 2015-04-12 MED ORDER — DICYCLOMINE HCL 20 MG PO TABS: ORAL_TABLET | ORAL | Status: DC

## 2015-04-12 NOTE — Discharge Instructions (Signed)
Drink plenty of fluids.  Tylenol for pain.  Follow up with your md next week as planned

## 2015-04-12 NOTE — ED Notes (Signed)
Pt states generalized abdominal pain which is worse to lower abd. Pt states multiple episodes of diarrhea daily x 1 week. Pt states right arm feels numb as well which began 0500 this am.

## 2015-04-12 NOTE — Telephone Encounter (Signed)
Called and left message for patient to return call.  

## 2015-04-12 NOTE — Telephone Encounter (Signed)
Vomiting No.    Recommended treatment Hydration is important Fluids small frequent amounts as tolerated Good hygiene reduces transmission among family members Review Brat diet  Zofran 4 mg 1 tablet daily as needed for nausea and vomiting no more than 6 tablets   DiarrheaYes.    Recommended treatment  Imodium OTC  Can also offer Lomotil 1 tablet 4 times daily as needed no more than 10 tablets Good hygiene reduces transmission among family members Review Brat Diet  If patient starts to feel light headed or not passing much urine or becoming dehydrated will need to go to emergency room for IV hydration  Please call office if symptoms worsen or do not improve after 2-3 days

## 2015-04-12 NOTE — Telephone Encounter (Signed)
Stool to be sent for testing, c//s, O/P , C dif asap, since symptom duration is so long Her colonoscopy is 5 years past due, she was referred earlier this year,  pls advise her to call and schedule and with the chronic diarrhea if she had no infection , GI would be better able to investigate, so would refer for that reason also

## 2015-04-12 NOTE — ED Provider Notes (Addendum)
CSN: [id number]     Arrival date & time 04/12/15  1124 History  This chart was scribed for Heather Ferguson, MD by Terressa Koyanagi, ED Scribe. This patient was seen in room APA03/APA03 and the patient's care was started at 11:45 AM.   Chief Complaint  Patient presents with  . Abdominal Pain   Patient is a 74 y.o. female presenting with abdominal pain. The history is provided by the patient. No language interpreter was used.  Abdominal Pain Pain location:  Generalized Pain quality: bloating, fullness, pressure and tugging   Pain severity:  Severe Onset quality:  Sudden Timing:  Intermittent Progression:  Unchanged Chronicity:  New Context: not trauma   Associated symptoms: diarrhea   Associated symptoms: no chest pain, no cough, no fatigue and no hematuria    PCP: [name], MD HPI Comments: Heather [name] is a 74 y.o. female, with PMHx noted below including abd hysterectomy, who presents to the Emergency Department complaining of generalized, worsening abd pain, with lower abd worse than upper abd-- pt notes she feels "full" and "tight" in the upper abd. Pt also complains of associated diarrhea onset one week ago and numbness of the right arm onset this morning at 5AM. Pt denies cholecystectomy.   Past Medical History  Diagnosis Date  . GERD (gastroesophageal reflux disease)   . Asthma   . Congestive heart failure   . FH: CAD (coronary artery disease)   . Heart failure   . Personal history of malignant neoplasm of breast   . Breast cancer 1998    left  . Hypertension 1998  . 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 Mastectomy  1997    left breast   . Vesicovaginal fistula closure w/ tah  1971  . Total abdominal hysterectomy w/ bilateral salpingoophorectomy  1971    unilateral   . Abdominal hysterectomy  1973    fibroids  .  Breast surgery Left 1998    breast ca   Family History  Problem Relation Age of Onset  . Coronary artery disease      Family History of males   . Parkinsonism Mother   . Cancer Sister     breast   . Cancer Sister     lung   . Cancer Brother     throat    Social History  Substance Use Topics  . Smoking status: Former Research scientist (life sciences)  . Smokeless tobacco: None  . Alcohol Use: No   OB History    No data available     Review of Systems  Constitutional: Negative for appetite change and fatigue.  HENT: Negative for congestion, ear discharge and sinus pressure.   Eyes: Negative for discharge.  Respiratory: Negative for cough.   Cardiovascular: Negative for chest pain.  Gastrointestinal: Positive for abdominal pain and diarrhea.  Genitourinary: Negative for frequency and hematuria.  Musculoskeletal: Negative for back pain.  Skin: Negative for rash.  Neurological: Positive for numbness. Negative for seizures and headaches.  Psychiatric/Behavioral: Negative for hallucinations.   Allergies  Aspirin and Penicillins  Home Medications   Prior to Admission medications   Medication Sig Start Date End Date Taking? Authorizing Provider  cholecalciferol (VITAMIN D) 400 UNITS TABS tablet Take 400 Units by mouth daily.    Historical Provider, MD  losartan-hydrochlorothiazide (HYZAAR) 100-25 MG per tablet  TAKE ONE (1) TABLET EACH DAY 03/21/15   Fayrene Helper, MD  metFORMIN (GLUCOPHAGE) 500 MG tablet Take 500 mg by mouth daily with breakfast. 12/26/13   Fayrene Helper, MD  metFORMIN (GLUCOPHAGE) 500 MG tablet TAKE ONE TABLET TWICE DAILY WITH A MEAL 01/09/15   Fayrene Helper, MD  potassium chloride SA (K-DUR,KLOR-CON) 20 MEQ tablet TAKE ONE (1) TABLET EACH DAY 02/11/15   Fayrene Helper, MD   Triage Vitals: BP 189/105 mmHg  Pulse 72  Temp(Src) 98.3 F (36.8 C) (Oral)  Resp 18  Ht 5' 5.5" (1.664 m)  Wt 239 lb (108.41 kg)  BMI 39.15 kg/m2  SpO2 96% Physical Exam  Constitutional:  She is oriented to person, place, and time. She appears well-developed.  HENT:  Head: Normocephalic.  Eyes: Conjunctivae and EOM are normal. No scleral icterus.  Neck: Neck supple. No thyromegaly present.  Cardiovascular: Normal rate and regular rhythm.  Exam reveals no gallop and no friction rub.   No murmur heard. Pulmonary/Chest: No stridor. She has no wheezes. She has no rales. She exhibits no tenderness.  Abdominal: She exhibits no distension. There is generalized tenderness. There is no rebound.  Musculoskeletal: Normal range of motion. She exhibits no edema.  Lymphadenopathy:    She has no cervical adenopathy.  Neurological: She is oriented to person, place, and time. She exhibits normal muscle tone. Coordination normal.  Skin: No rash noted. No erythema.  Psychiatric: She has a normal mood and affect. Her behavior is normal.    ED Course  Procedures (including critical care time) DIAGNOSTIC STUDIES: Oxygen Saturation is 96% on RA, nl by my interpretation.    COORDINATION OF CARE: 11:47 AM: Discussed treatment plan with pt at bedside; patient verbalizes understanding and agrees with treatment plan.  Labs Review Labs Reviewed - No data to display I have personally reviewed and evaluated these lab results as part of my medical decision-making.   MDM   Final diagnoses:  None    Labs unremarkable CT scan of the abdomen unremarkable. Diarrhea and abdominal cramping most likely secondary to virus. Patient given some Bentyl for the cramping and instructed to drink plenty of fluids and take some Imodium if needed. She is to follow-up her PCP in 5 days.j   The chart was scribed for me under my direct supervision.  I personally performed the history, physical, and medical decision making and all procedures in the evaluation of this patient.Heather Ferguson, MD 04/12/15 1607  Heather Ferguson, MD 04/12/15 [phone]

## 2015-04-15 NOTE — Telephone Encounter (Signed)
Noted that patient went to the ED

## 2015-04-17 ENCOUNTER — Ambulatory Visit: Payer: Medicare Other | Admitting: Family Medicine

## 2015-04-22 ENCOUNTER — Other Ambulatory Visit: Payer: Self-pay | Admitting: Family Medicine

## 2015-05-09 ENCOUNTER — Ambulatory Visit (INDEPENDENT_AMBULATORY_CARE_PROVIDER_SITE_OTHER): Payer: Medicare Other | Admitting: Family Medicine

## 2015-05-09 ENCOUNTER — Encounter: Payer: Self-pay | Admitting: Family Medicine

## 2015-05-09 VITALS — BP 120/84 | HR 60 | Resp 16 | Ht 66.0 in | Wt 253.0 lb

## 2015-05-09 DIAGNOSIS — R7303 Prediabetes: Secondary | ICD-10-CM | POA: Diagnosis not present

## 2015-05-09 DIAGNOSIS — R7309 Other abnormal glucose: Secondary | ICD-10-CM | POA: Diagnosis not present

## 2015-05-09 DIAGNOSIS — I10 Essential (primary) hypertension: Secondary | ICD-10-CM

## 2015-05-09 DIAGNOSIS — Z23 Encounter for immunization: Secondary | ICD-10-CM

## 2015-05-09 DIAGNOSIS — E785 Hyperlipidemia, unspecified: Secondary | ICD-10-CM

## 2015-05-09 DIAGNOSIS — E8881 Metabolic syndrome: Secondary | ICD-10-CM

## 2015-05-09 LAB — COMPLETE METABOLIC PANEL WITH GFR
ALT: 7 U/L (ref 6–29)
AST: 13 U/L (ref 10–35)
Albumin: 4 g/dL (ref 3.6–5.1)
Alkaline Phosphatase: 71 U/L (ref 33–130)
BUN: 15 mg/dL (ref 7–25)
CO2: 28 mmol/L (ref 20–31)
Calcium: 9 mg/dL (ref 8.6–10.4)
Chloride: 100 mmol/L (ref 98–110)
Creat: 0.81 mg/dL (ref 0.60–0.93)
GFR, Est African American: 83 mL/min (ref >=60)
GFR, Est Non African American: 72 mL/min (ref >=60)
Glucose, Bld: 87 mg/dL (ref 65–99)
Potassium: 3.9 mmol/L (ref 3.5–5.3)
Sodium: 139 mmol/L (ref 135–146)
Total Bilirubin: 0.5 mg/dL (ref 0.2–1.2)
Total Protein: 7.7 g/dL (ref 6.1–8.1)

## 2015-05-09 LAB — HEMOGLOBIN A1C
Hgb A1c MFr Bld: 5.7 % — ABNORMAL HIGH (ref <5.7)
Mean Plasma Glucose: 117 mg/dL — ABNORMAL HIGH (ref <117)

## 2015-05-09 NOTE — Patient Instructions (Addendum)
Annual wellness in 4.5 months, call if you need me before  Careful getting out of van , and prevent falls please, exam of left knee and ankle today, shows no evidence of bone or tendon damage  Flu vaccine today  Labs today, hBA1C, chem 7 and EGFR,   My condolence on your recent loss  Please work on good  health habits so that your health will improve. 1. Commitment to daily physical activity for 30 to 60  minutes, if you are able to do this.  2. Commitment to wise food choices. Aim for half of your  food intake to be vegetable and fruit, one quarter starchy foods, and one quarter protein. Try to eat on a regular schedule  3 meals per day, snacking between meals should be limited to vegetables or fruits or small portions of nuts. 64 ounces of water per day is generally recommended, unless you have specific health conditions, like heart failure or kidney failure where you will need to limit fluid intake.  3. Commitment to sufficient and a  good quality of physical and mental rest daily, generally between 6 to 8 hours per day.  WITH PERSISTANCE AND PERSEVERANCE, THE IMPOSSIBLE , BECOMES THE NORM!  Thanks for choosing [facility], we consider it a privelige to serve you.

## 2015-05-09 NOTE — Progress Notes (Signed)
Subjective:    Patient ID: Heather [name], female    DOB: August 28, 1940, 74 y.o.   MRN: [id number]  HPI    Heather [name]     MRN: [id number]      DOB: [birth year]   HPI Ms. Brodowski is here for follow up and re-evaluation of chronic medical conditions, medication management and review of any available recent lab and radiology data.  Preventive health is updated, specifically  Cancer screening and Immunization.  Needs to reschedule colonoscopy Questions or concerns regarding consultations or procedures which the PT has had in the interim are  addressed. The PT denies any adverse reactions to current medications since the last visit.  Lost her ex husband since last visit, grieving but not depressed. C/o left leg pain and swelling , accidentally slid when getting out of van several days ago and hit her left knee and ankle, able to weight bear per usual, and no bruising or skin breakdown  ROS Denies recent fever or chills. Denies sinus pressure, nasal congestion, ear pain or sore throat. Denies chest congestion, productive cough or wheezing. Denies chest pains, palpitations and leg swelling Denies abdominal pain, nausea, vomiting,diarrhea or constipation.   Denies dysuria, frequency, hesitancy or incontinence.  Denies headaches, seizures, numbness, or tingling. Denies depression, anxiety or insomnia. Denies skin break down or rash.   PE  BP 120/84 mmHg  Pulse 60  Resp 16  Ht 5\' 6"  (1.676 m)  Wt 253 lb (114.76 kg)  BMI 40.85 kg/m2  SpO2 98%  Patient alert and oriented and in no cardiopulmonary distress.  HEENT: No facial asymmetry, EOMI,   oropharynx pink and moist.  Neck supple no JVD, no mass.  Chest: Clear to auscultation bilaterally.  CVS: S1, S2 no murmurs, no S3.Regular rate.  ABD: Soft non tender.   Ext: No edema  MS: Adequate ROM spine, shoulders, hips and knees.No localized tenderness on palpation of left knee or ankle  Skin: Intact, no ulcerations or rash  noted.  Psych: Good eye contact, normal affect. Memory intact not anxious or depressed appearing.  CNS: CN 2-12 intact, power,  normal throughout.no focal deficits noted.   Assessment & Plan   Essential hypertension Controlled, no change in medication DASH diet and commitment to daily physical activity for a minimum of 30 minutes discussed and encouraged, as a part of hypertension management. The importance of attaining a healthy weight is also discussed.  BP/Weight 05/09/2015 04/12/2015 11/14/2014 11/14/2014 05/30/2014 12/26/2013 08/03/7260  Systolic BP 035 597 416 384 536 468 032  Diastolic BP 84 67 68 84 82 82 82  Wt. (Lbs) 253 239 250 251 254.12 257.12 255.5  BMI 40.85 39.15 44.3 44.47 45.03 45.56 45.27        MORBID OBESITY Deteriorated. Patient re-educated about  the importance of commitment to a  minimum of 150 minutes of exercise per week.  The importance of healthy food choices with portion control discussed. Encouraged to start a food diary, count calories and to consider  joining a support group. Sample diet sheets offered. Goals set by the patient for the next several months.   Weight /BMI 05/09/2015 04/12/2015 11/14/2014  WEIGHT 253 lb 239 lb 250 lb  HEIGHT 5\' 6"  5' 5.5" -  BMI 40.85 kg/m2 39.15 kg/m2 44.3 kg/m2    Current exercise per week 90 minutes.   Prediabetes Patient educated about the importance of limiting  Carbohydrate intake , the need to commit to daily physical activity for a minimum of 30  minutes , and to commit weight loss. The fact that changes in all these areas will reduce or eliminate all together the development of diabetes is stressed.  Improved  Diabetic Labs Latest Ref Rng 05/09/2015 04/12/2015 11/14/2014 05/30/2014 12/26/2013  HbA1c <5.7 % 5.7(H) - 5.9(H) 5.9(H) 5.9(H)  Chol 0 - 200 mg/dL - - 179 - -  HDL >=46 mg/dL - - 44(L) - -  Calc LDL 0 - 99 mg/dL - - 116(H) - -  Triglycerides <150 mg/dL - - 94 - -  Creatinine 0.60 - 0.93 mg/dL 0.81  0.98 1.00 0.95 0.88   BP/Weight 05/09/2015 04/12/2015 11/14/2014 11/14/2014 05/30/2014 12/26/2013 3/72/9021  Systolic BP 115 520 802 233 612 244 975  Diastolic BP 84 67 68 84 82 82 82  Wt. (Lbs) 253 239 250 251 254.12 257.12 255.5  BMI 40.85 39.15 44.3 44.47 45.03 45.56 45.27   No flowsheet data found.     Dyslipidemia Hyperlipidemia:Low fat diet discussed and encouraged.   Lipid Panel  Lab Results  Component Value Date   CHOL 179 11/14/2014   HDL 44* 11/14/2014   LDLCALC 116* 11/14/2014   TRIG 94 11/14/2014   CHOLHDL 4.1 11/14/2014   Updated lab needed at/ before next visit.      Metabolic syndrome X The increased risk of cardiovascular disease associated with this diagnosis, and the need to consistently work on lifestyle to change this is discussed. Following  a  heart healthy diet ,commitment to 30 minutes of exercise at least 5 days per week, as well as control of blood sugar and cholesterol , and achieving a healthy weight are all the areas to be addressed .   Need for prophylactic vaccination and inoculation against influenza After obtaining informed consent, the vaccine is  administered by LPN.       Review of Systems     Objective:   Physical Exam        Assessment & Plan:

## 2015-05-10 ENCOUNTER — Other Ambulatory Visit: Payer: Self-pay | Admitting: Family Medicine

## 2015-05-10 DIAGNOSIS — Z1231 Encounter for screening mammogram for malignant neoplasm of breast: Secondary | ICD-10-CM

## 2015-05-11 NOTE — Assessment & Plan Note (Signed)
Hyperlipidemia:Low fat diet discussed and encouraged.   Lipid Panel  Lab Results  Component Value Date   CHOL 179 11/14/2014   HDL 44* 11/14/2014   LDLCALC 116* 11/14/2014   TRIG 94 11/14/2014   CHOLHDL 4.1 11/14/2014   Updated lab needed at/ before next visit.

## 2015-05-11 NOTE — Assessment & Plan Note (Signed)
After obtaining informed consent, the vaccine is  administered by LPN.  

## 2015-05-11 NOTE — Assessment & Plan Note (Signed)
The increased risk of cardiovascular disease associated with this diagnosis, and the need to consistently work on lifestyle to change this is discussed. Following  a  heart healthy diet ,commitment to 30 minutes of exercise at least 5 days per week, as well as control of blood sugar and cholesterol , and achieving a healthy weight are all the areas to be addressed .  

## 2015-05-11 NOTE — Assessment & Plan Note (Signed)
Controlled, no change in medication DASH diet and commitment to daily physical activity for a minimum of 30 minutes discussed and encouraged, as a part of hypertension management. The importance of attaining a healthy weight is also discussed.  BP/Weight 05/09/2015 04/12/2015 11/14/2014 11/14/2014 05/30/2014 12/26/2013 6/81/1572  Systolic BP 620 355 974 163 845 364 680  Diastolic BP 84 67 68 84 82 82 82  Wt. (Lbs) 253 239 250 251 254.12 257.12 255.5  BMI 40.85 39.15 44.3 44.47 45.03 45.56 45.27

## 2015-05-11 NOTE — Assessment & Plan Note (Signed)
Deteriorated. Patient re-educated about  the importance of commitment to a  minimum of 150 minutes of exercise per week.  The importance of healthy food choices with portion control discussed. Encouraged to start a food diary, count calories and to consider  joining a support group. Sample diet sheets offered. Goals set by the patient for the next several months.   Weight /BMI 05/09/2015 04/12/2015 11/14/2014  WEIGHT 253 lb 239 lb 250 lb  HEIGHT 5\' 6"  5' 5.5" -  BMI 40.85 kg/m2 39.15 kg/m2 44.3 kg/m2    Current exercise per week 90 minutes.

## 2015-05-11 NOTE — Assessment & Plan Note (Signed)
Patient educated about the importance of limiting  Carbohydrate intake , the need to commit to daily physical activity for a minimum of 30 minutes , and to commit weight loss. The fact that changes in all these areas will reduce or eliminate all together the development of diabetes is stressed.  Improved  Diabetic Labs Latest Ref Rng 05/09/2015 04/12/2015 11/14/2014 05/30/2014 12/26/2013  HbA1c <5.7 % 5.7(H) - 5.9(H) 5.9(H) 5.9(H)  Chol 0 - 200 mg/dL - - 179 - -  HDL >=46 mg/dL - - 44(L) - -  Calc LDL 0 - 99 mg/dL - - 116(H) - -  Triglycerides <150 mg/dL - - 94 - -  Creatinine 0.60 - 0.93 mg/dL 0.81 0.98 1.00 0.95 0.88   BP/Weight 05/09/2015 04/12/2015 11/14/2014 11/14/2014 05/30/2014 12/26/2013 7/54/4920  Systolic BP 100 712 197 588 325 498 264  Diastolic BP 84 67 68 84 82 82 82  Wt. (Lbs) 253 239 250 251 254.12 257.12 255.5  BMI 40.85 39.15 44.3 44.47 45.03 45.56 45.27   No flowsheet data found.

## 2015-06-03 ENCOUNTER — Ambulatory Visit (HOSPITAL_COMMUNITY): Payer: Medicare Other

## 2015-06-20 ENCOUNTER — Ambulatory Visit (HOSPITAL_COMMUNITY)
Admission: RE | Admit: 2015-06-20 | Discharge: 2015-06-20 | Disposition: A | Discharge status: Home / Self Care | Payer: Medicare Other | Source: Ambulatory Visit | Attending: Family Medicine | Admitting: Family Medicine

## 2015-06-20 DIAGNOSIS — Z1231 Encounter for screening mammogram for malignant neoplasm of breast: Secondary | ICD-10-CM | POA: Insufficient documentation

## 2015-06-24 ENCOUNTER — Other Ambulatory Visit: Payer: Self-pay | Admitting: Family Medicine

## 2015-06-24 DIAGNOSIS — R928 Other abnormal and inconclusive findings on diagnostic imaging of breast: Secondary | ICD-10-CM

## 2015-07-04 ENCOUNTER — Ambulatory Visit
Admission: RE | Admit: 2015-07-04 | Discharge: 2015-07-04 | Disposition: A | Discharge status: Home / Self Care | Payer: Medicare Other | Source: Ambulatory Visit | Attending: Family Medicine | Admitting: Family Medicine

## 2015-07-04 DIAGNOSIS — R928 Other abnormal and inconclusive findings on diagnostic imaging of breast: Secondary | ICD-10-CM

## 2015-07-04 DIAGNOSIS — R921 Mammographic calcification found on diagnostic imaging of breast: Secondary | ICD-10-CM | POA: Diagnosis not present

## 2015-07-31 ENCOUNTER — Other Ambulatory Visit: Payer: Self-pay | Admitting: Family Medicine

## 2015-09-25 ENCOUNTER — Encounter: Payer: Medicare Other | Admitting: Family Medicine

## 2015-10-08 ENCOUNTER — Telehealth: Payer: Self-pay

## 2015-10-08 DIAGNOSIS — I10 Essential (primary) hypertension: Secondary | ICD-10-CM

## 2015-10-08 DIAGNOSIS — E785 Hyperlipidemia, unspecified: Secondary | ICD-10-CM

## 2015-10-08 DIAGNOSIS — R7303 Prediabetes: Secondary | ICD-10-CM

## 2015-10-08 NOTE — Telephone Encounter (Signed)
Labs ordered.

## 2015-10-17 DIAGNOSIS — C50912 Malignant neoplasm of unspecified site of left female breast: Secondary | ICD-10-CM | POA: Diagnosis not present

## 2015-11-11 NOTE — Assessment & Plan Note (Addendum)
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 All therapy finished as of September 1998.  She is up to date on mammogram and she has a repeat mammogram in ~ 2 months.  No role for labs from an oncology standpoint at this time.  Continue follow-up with primary care provider as directed.  Return in 12 months for follow-up.

## 2015-11-11 NOTE — Progress Notes (Signed)
[name], MD [address] [place] [address]  History of Invasive medullary carcinoma of left breast  CURRENT THERAPY: Surveillance per NCCN guidelines  INTERVAL HISTORY: Heather [name] 75 y.o. female returns for followup of stage II B. 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 All therapy finished as of September 1998.  Oncology History   stage II B. 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 All therapy finished as of September 1998      History of Invasive medullary carcinoma of left breast   05/30/1996 Surgery Modified radical mastectomy demonstrating a Stage IIB (T2N1M0) medullary carcinoma of left breast with 4/13 positive lymph nodes.  ER/PR negative.   08/07/1996 - 01/01/1997 Chemotherapy CMFx 6 cycles   01/24/1997 - 04/16/1997 Radiation Therapy Approximate start and completion dates.  A few breaks in radiation therapy due to desquamation    I personally reviewed and went over laboratory results with the patient.  The results are noted within this dictation.  I personally reviewed and went over radiographic studies with the patient.  The results are noted within this dictation.  Her next mammogram is due Dec 2017.  Last mammogram was on 07/04/2015 and was BIRADS 3.  She is scheduled for a repeat mammogram in ~ 2 months (6 months from the December 2016).  She is doing very well.  She denies any complaints.  She reviewed her oncology history/treatment with me.    Past Medical History  Diagnosis Date  . GERD (gastroesophageal reflux disease)   . Asthma   .  Congestive heart failure ([place])   . FH: CAD (coronary artery disease)   . Heart failure   . Personal history of malignant neoplasm of breast   . Breast cancer ([place]) 1998    left  . Hypertension 1998  . Invasive medullary carcinoma of left breast 07/29/2007    Qualifier: Diagnosis of  By: [place] LPN, Kim  Left mastectomy  In 2000      has MORBID OBESITY; Essential hypertension; CONGESTIVE HEART FAILURE; ASTHMA; FATIGUE; History of Invasive medullary carcinoma of left breast; Prediabetes; Dyslipidemia; Seasonal allergies; Metabolic syndrome X; Abdominal mass, right upper quadrant; and Need for prophylactic vaccination and inoculation against influenza on her problem list.     is allergic to aspirin and penicillins.  Ms. Lucena had no medications administered during this visit.  Past Surgical History  Procedure Laterality Date  . Mastectomy  1997    left breast   . Vesicovaginal fistula closure w/ tah  1971  . Total abdominal hysterectomy w/ bilateral salpingoophorectomy  1971    unilateral   . Abdominal hysterectomy  1973    fibroids  . Breast surgery Left 1998    breast ca    Denies any headaches, dizziness, double vision, fevers, chills, night sweats, nausea, vomiting, diarrhea, constipation, chest pain, heart palpitations, shortness of breath, blood in stool, black tarry stool, urinary pain, urinary burning, urinary frequency, hematuria.   PHYSICAL EXAMINATION  ECOG PERFORMANCE STATUS: 0 - Asymptomatic  Filed Vitals:   11/12/15 0925  BP: 155/80  Pulse: 57  Temp: 98.2 F (36.8 C)  Resp: 18    GENERAL:alert, no distress, well nourished, well developed, comfortable, cooperative, obese and smiling, accompanied by her daughter. SKIN: skin color, texture, turgor are normal, no rashes or significant lesions HEAD: Normocephalic, No masses, lesions, tenderness or abnormalities EYES: normal, PERRLA, EOMI, Conjunctiva are pink and non-injected EARS: External ears  normal OROPHARYNX:lips, buccal mucosa, and tongue normal and mucous membranes are moist  NECK: supple, no adenopathy, thyroid normal size, non-tender, without nodularity, no stridor, non-tender, trachea midline LYMPH:  no palpable lymphadenopathy BREAST: patient declined due to recent and upcoming mammogram. LUNGS: clear to auscultation and percussion without wheezes, rales, or rhonchi. HEART: regular rate & rhythm, no murmurs, no gallops, S1 normal and S2 normal ABDOMEN:abdomen soft, non-tender, obese, normal bowel sounds and no masses or organomegaly BACK: Back symmetric, no curvature., No CVA tenderness EXTREMITIES:less then 2 second capillary refill, no joint deformities, effusion, or inflammation, no skin discoloration, no clubbing, no cyanosis  NEURO: alert & oriented x 3 with fluent speech, no focal motor/sensory deficits, gait normal    LABORATORY DATA: CBC    Component Value Date/Time   WBC 9.8 04/12/2015 1224   RBC 4.16 04/12/2015 1224   HGB 12.6 04/12/2015 1224   HCT 38.3 04/12/2015 1224   PLT 361 04/12/2015 1224   MCV 92.1 04/12/2015 1224   MCH 30.3 04/12/2015 1224   MCHC 32.9 04/12/2015 1224   RDW 14.8 04/12/2015 1224   LYMPHSABS 3.0 04/12/2015 1224   MONOABS 0.7 04/12/2015 1224   EOSABS 0.1 04/12/2015 1224   BASOSABS 0.0 04/12/2015 1224      Chemistry      Component Value Date/Time   NA 139 05/09/2015 1214   K 3.9 05/09/2015 1214   CL 100 05/09/2015 1214   CO2 28 05/09/2015 1214   BUN 15 05/09/2015 1214   CREATININE 0.81 05/09/2015 1214   CREATININE 0.98 04/12/2015 1224      Component Value Date/Time   CALCIUM 9.0 05/09/2015 1214   ALKPHOS 71 05/09/2015 1214   AST 13 05/09/2015 1214   ALT 7 05/09/2015 1214   BILITOT 0.5 05/09/2015 1214        ASSESSMENT AND PLAN:  History of Invasive medullary carcinoma of left breast Stage IIB (T2, N1, M0) medullary carcinoma the left breast status post modified radical mastectomy 05/30/1996 with 4 of 13 positive  lymph nodes treated with CMF chemotherapy x6 cycles, followed by radiation therapy for this ER/PR negative cancer. All therapy finished as of September 1998.  She is up to date on mammogram and she has a repeat mammogram in ~ 2 months.  No role for labs from an oncology standpoint at this time.  Continue follow-up with primary care provider as directed.  Return in 12 months for follow-up.   THERAPY PLAN:  NCCN guidelines recommends the following surveillance for invasive breast cancer (2.2016):  A. History and Physical exam 1-4 times per year as clinically appropriate for 5 years, then annually.  B. Periodic screening for changes in family history and referral to genetics counseling as indicated  C. Educate, monitor, and refer to lymphedema management.  D. Mammography every 12 months  E. Routine imaging of reconstructed breast is not indicated.  F. In the absence of clinical signs and symptoms suggestive of recurrent disease, there is no indication for laboratory or imaging studies for metastases screening.  G. Women on Tamoxifen: annual gynecologic assessment every 12 months if uterus is present.  H. Women on aromatase inhibitor or who experience ovarian failure secondary to  treatment should have monitoring of bone health with a bone mineral density determination at baseline and periodically thereafter.  I. Assess and encourage adherence to adjuvant endocrine therapy.  J. Evidence suggests that active lifestyle, healthy diet, limited alcohol intake, and achieving and maintaining an ideal body weight (20-25 BMI) may lead to optimal breast cancer outcomes.   All questions were answered. The patient knows to call the clinic with any problems, questions or concerns. We can certainly see the patient much sooner if necessary.  Patient and plan discussed with Dr. Ancil Linsey and she is in agreement with the aforementioned.   This note is electronically signed by:  Robynn Pane 11/12/2015 2:04 PM

## 2015-11-12 ENCOUNTER — Encounter (HOSPITAL_COMMUNITY): Payer: Self-pay | Admitting: Oncology

## 2015-11-12 ENCOUNTER — Encounter (HOSPITAL_COMMUNITY): Payer: Medicare Other | Attending: Oncology | Admitting: Oncology

## 2015-11-12 VITALS — BP 155/80 | HR 57 | Temp 98.2°F | Resp 18 | Wt 245.7 lb

## 2015-11-12 DIAGNOSIS — Z853 Personal history of malignant neoplasm of breast: Secondary | ICD-10-CM

## 2015-11-12 DIAGNOSIS — C50912 Malignant neoplasm of unspecified site of left female breast: Secondary | ICD-10-CM

## 2015-11-12 NOTE — Patient Instructions (Signed)
[place] [facility] Discharge Instructions  RECOMMENDATIONS MADE BY THE CONSULTANT AND ANY TEST RESULTS WILL BE SENT TO YOUR REFERRING PHYSICIAN.  Continue with mammograms as directed.   Continue follow-up with Dr. Moshe Cipro as directed. Return in 12 months for follow-up.   Please call us with any questions or concerns related to your breast/breast cancer.  Thank you for choosing [facility] to provide your oncology and hematology care.  To afford each patient quality time with our provider, please arrive at least 15 minutes before your scheduled appointment time.   Beginning January 23rd 2017 lab work for the [facility] will be done in the  Main lab at [facility] on 1st floor. If you have a lab appointment with the [facility] please come in thru the  Main Entrance and check in at the main information desk  You need to re-schedule your appointment should you arrive 10 or more minutes late.  We strive to give you quality time with our providers, and arriving late affects you and other patients whose appointments are after yours.  Also, if you no show three or more times for appointments you may be dismissed from the clinic at the providers discretion.     Again, thank you for choosing [facility]  Our hope is that these requests will decrease the amount of time that you wait before being seen by our physicians.       _____________________________________________________________  Should you have questions after your visit to [facility], please contact our office at [phone] between the hours of 8:30 a.m. and 4:30 p.m.  Voicemails left after 4:30 p.m. will not be returned until the following business day.  For prescription refill requests, have your pharmacy contact our office.         Resources For Cancer Patients and their Caregivers ? American Cancer Society: Can assist with  transportation, wigs, general needs, runs Look Good Feel Better.        ([phone] 337-0061 ? Cancer Care: Provides financial assistance, online support groups, medication/co-pay assistance.  1-800-813-HOPE (628)171-8855) ? [place] Assists [place] cancer patients and their families through emotional , educational and financial support.  (318)279-0868 ? [place] DSS Where to apply for food stamps, Medicaid and utility assistance. 954-696-8793 ? [facility]: Transportation to medical appointments. 419-584-0895 ? Social Security Administration: May apply for disability if have a Stage IV cancer. [phone] 928-237-8358 ? [employer], Disability and Transit Services: Assists with nutrition, care and transit needs. ([phone]

## 2015-11-14 ENCOUNTER — Ambulatory Visit (HOSPITAL_COMMUNITY): Payer: Medicare Other | Admitting: Oncology

## 2015-11-14 DIAGNOSIS — C50912 Malignant neoplasm of unspecified site of left female breast: Secondary | ICD-10-CM | POA: Diagnosis not present

## 2015-11-30 ENCOUNTER — Other Ambulatory Visit: Payer: Self-pay | Admitting: Family Medicine

## 2016-01-19 ENCOUNTER — Emergency Department (HOSPITAL_COMMUNITY): Payer: Medicare Other

## 2016-01-19 ENCOUNTER — Encounter (HOSPITAL_COMMUNITY): Payer: Self-pay | Admitting: Emergency Medicine

## 2016-01-19 ENCOUNTER — Emergency Department (HOSPITAL_COMMUNITY)
Admission: EM | Admit: 2016-01-19 | Discharge: 2016-01-19 | Disposition: A | Discharge status: Home / Self Care | Payer: Medicare Other | Attending: Emergency Medicine | Admitting: Emergency Medicine

## 2016-01-19 DIAGNOSIS — I509 Heart failure, unspecified: Secondary | ICD-10-CM | POA: Diagnosis not present

## 2016-01-19 DIAGNOSIS — Z79899 Other long term (current) drug therapy: Secondary | ICD-10-CM | POA: Diagnosis not present

## 2016-01-19 DIAGNOSIS — Z7984 Long term (current) use of oral hypoglycemic drugs: Secondary | ICD-10-CM | POA: Insufficient documentation

## 2016-01-19 DIAGNOSIS — Z87891 Personal history of nicotine dependence: Secondary | ICD-10-CM | POA: Insufficient documentation

## 2016-01-19 DIAGNOSIS — J069 Acute upper respiratory infection, unspecified: Secondary | ICD-10-CM | POA: Insufficient documentation

## 2016-01-19 DIAGNOSIS — Z853 Personal history of malignant neoplasm of breast: Secondary | ICD-10-CM | POA: Diagnosis not present

## 2016-01-19 DIAGNOSIS — I2510 Atherosclerotic heart disease of native coronary artery without angina pectoris: Secondary | ICD-10-CM | POA: Insufficient documentation

## 2016-01-19 DIAGNOSIS — R05 Cough: Secondary | ICD-10-CM | POA: Diagnosis not present

## 2016-01-19 DIAGNOSIS — I110 Hypertensive heart disease with heart failure: Secondary | ICD-10-CM | POA: Insufficient documentation

## 2016-01-19 DIAGNOSIS — R0602 Shortness of breath: Secondary | ICD-10-CM | POA: Diagnosis present

## 2016-01-19 DIAGNOSIS — J45901 Unspecified asthma with (acute) exacerbation: Secondary | ICD-10-CM | POA: Insufficient documentation

## 2016-01-19 LAB — BASIC METABOLIC PANEL
Anion gap: 11 (ref 5–15)
BUN: 14 mg/dL (ref 6–20)
CO2: 27 mmol/L (ref 22–32)
Calcium: 8.7 mg/dL — ABNORMAL LOW (ref 8.9–10.3)
Chloride: 99 mmol/L — ABNORMAL LOW (ref 101–111)
Creatinine, Ser: 0.93 mg/dL (ref 0.44–1.00)
GFR calc Af Amer: >60 mL/min (ref >60)
GFR calc non Af Amer: 59 mL/min — ABNORMAL LOW (ref >60)
Glucose, Bld: 105 mg/dL — ABNORMAL HIGH (ref 65–99)
Potassium: 3.3 mmol/L — ABNORMAL LOW (ref 3.5–5.1)
Sodium: 137 mmol/L (ref 135–145)

## 2016-01-19 LAB — TROPONIN I: Troponin I: <0.03 ng/mL (ref <0.03)

## 2016-01-19 MED ORDER — IPRATROPIUM-ALBUTEROL 0.5-2.5 (3) MG/3ML IN SOLN
3.0000 mL | Freq: Once | RESPIRATORY_TRACT | Status: AC
  Administered 2016-01-19: 3 mL via RESPIRATORY_TRACT
  Filled 2016-01-19: qty 3

## 2016-01-19 MED ORDER — DOXYCYCLINE HYCLATE 100 MG PO CAPS: 100.0000 mg | ORAL_CAPSULE | Freq: Two times a day (BID) | ORAL | Status: DC

## 2016-01-19 MED ORDER — PREDNISONE 10 MG PO TABS: 20.0000 mg | ORAL_TABLET | Freq: Every day | ORAL | Status: DC

## 2016-01-19 NOTE — ED Notes (Signed)
BP cuff off, lab in room with pt.

## 2016-01-19 NOTE — ED Notes (Signed)
Pt c/o increasing sob since Wednesday. np cough.

## 2016-01-19 NOTE — ED Provider Notes (Signed)
CSN: [id number]     Arrival date & time 01/19/16  1027 History  By signing my name below, I, Irene Pap, attest that this documentation has been prepared under the direction and in the presence of Davonna Belling, MD. Electronically Signed: Irene Pap, ED Scribe. 01/19/2016. 10:45 AM.   Chief Complaint  Patient presents with  . Shortness of Breath   The history is provided by the patient. No language interpreter was used.  HPI Comments: Heather [name] is a 75 y.o. Female with a hx of asthma, CHF, CAD, breast cancer and HTN who presents to the Emergency Department complaining of gradually worsening SOB onset 4 days ago, worsening last night. Pt reports associated productive cough with green sputum, chills, and back pain. She has not taken anything for her symptoms. Pt denies fever, chest pain, leg swelling, and rash.  Past Medical History  Diagnosis Date  . GERD (gastroesophageal reflux disease)   . Asthma   . Congestive heart failure ([place])   . FH: CAD (coronary artery disease)   . Heart failure   . Personal history of malignant neoplasm of breast   . Breast cancer ([place]) 1998    left  . Hypertension 1998  . 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 Mastectomy  1997    left breast   . Vesicovaginal fistula closure w/ tah  1971  . Total abdominal hysterectomy w/ bilateral salpingoophorectomy  1971    unilateral   . Abdominal hysterectomy  1973    fibroids  . Breast surgery Left 1998    breast ca   Family History  Problem Relation Age of Onset  . Coronary artery disease      Family History of males   . Parkinsonism Mother   . Cancer Sister     breast   . Cancer Sister     lung   . Cancer Brother     throat    Social History  Substance Use Topics  . Smoking status: Former Research scientist (life sciences)  . Smokeless tobacco: None  . Alcohol Use:  No   OB History    No data available     Review of Systems  Constitutional: Positive for chills. Negative for fever.  Respiratory: Positive for cough and shortness of breath.   Cardiovascular: Negative for chest pain and leg swelling.  Musculoskeletal: Positive for back pain.  All other systems reviewed and are negative.   Allergies  Aspirin and Penicillins  Home Medications   Prior to Admission medications   Medication Sig Start Date End Date Taking? Authorizing Provider  cholecalciferol (VITAMIN D) 400 UNITS TABS tablet Take 400 Units by mouth daily.   Yes Historical Provider, MD  losartan-hydrochlorothiazide (HYZAAR) 100-25 MG tablet TAKE ONE TABLET ONCE DAILY 08/01/15  Yes Fayrene Helper, MD  metFORMIN (GLUCOPHAGE) 500 MG tablet TAKE ONE TABLET TWICE DAILY WITH A MEAL 12/02/15  Yes Fayrene Helper, MD  potassium chloride SA (K-DUR,KLOR-CON) 20 MEQ tablet TAKE ONE TABLET ONCE DAILY 12/02/15  Yes Fayrene Helper, MD  doxycycline (VIBRAMYCIN) 100 MG capsule Take 1 capsule (100 mg total) by mouth 2 (two) times daily. 01/19/16   Davonna Belling, MD  predniSONE (DELTASONE) 10 MG tablet Take 2 tablets (20 mg total) by mouth daily. 01/19/16   Davonna Belling, MD   BP 129/64 mmHg  Pulse 67  Temp(Src) 98.9 F (37.2 C) (Oral)  Resp 17  Ht 5\' 4"  (1.626 m)  Wt 225 lb (102.059 kg)  BMI 38.60 kg/m2  SpO2 91% Physical Exam  Constitutional: She is oriented to person, place, and time. She appears well-developed and well-nourished.  obese  HENT:  Head: Normocephalic and atraumatic.  Eyes: EOM are normal. Pupils are equal, round, and reactive to light.  Neck: Normal range of motion. Neck supple.  Cardiovascular: Normal rate, regular rhythm and normal heart sounds.  Exam reveals no gallop and no friction rub.   No murmur heard. Pulmonary/Chest: Effort normal. She has wheezes.  Diffuse wheezes, worse on left side; post mastectomy  Abdominal: Soft. There is no tenderness.   Musculoskeletal: Normal range of motion. She exhibits no edema.  Neurological: She is alert and oriented to person, place, and time.  Skin: Skin is warm and dry.  Psychiatric: She has a normal mood and affect. Her behavior is normal.  Nursing note and vitals reviewed.   ED Course  Procedures (including critical care time) DIAGNOSTIC STUDIES: Oxygen Saturation is 93% on RA, low by my interpretation.    COORDINATION OF CARE: 10:44 AM-Discussed treatment plan which includes labs and x-ray with pt at bedside and pt agreed to plan.    Labs Review Labs Reviewed  BASIC METABOLIC PANEL - Abnormal; Notable for the following:    Potassium 3.3 (*)    Chloride 99 (*)    Glucose, Bld 105 (*)    Calcium 8.7 (*)    GFR calc non Af Amer 59 (*)    All other components within normal limits  TROPONIN I    Imaging Review Dg Chest 2 View  01/19/2016  CLINICAL DATA:  Cough and shortness of breath for 3 days. EXAM: CHEST  2 VIEW COMPARISON:  11/09/2011 FINDINGS: The lungs are clear wiithout focal pneumonia, edema, pneumothorax or pleural effusion. Interstitial markings are diffusely coarsened with chronic features. The cardiopericardial silhouette is within normal limits for size. The visualized bony structures of the thorax are intact. Degenerative changes noted left shoulder. Surgical clips noted left axilla. Telemetry leads overlie the chest. IMPRESSION: Chronic interstitial lung disease.  No acute findings. Electronically Signed   By: Misty Stanley M.D.   On: 01/19/2016 11:02   I have personally reviewed and evaluated these images and lab results as part of my medical decision-making.   EKG Interpretation   Date/Time:  Sunday January 19 2016 10:47:07 EDT Ventricular Rate:  85 PR Interval:    QRS Duration: 79 QT Interval:  378 QTC Calculation: 408 R Axis:   44 Text Interpretation:  Sinus rhythm Premature atrial complexes Confirmed by  Alvino Chapel  MD, Ovid Curd [phone]) on 01/19/2016 11:40:14 AM       MDM   Final diagnoses:  Asthma exacerbation  URI (upper respiratory infection)    Patient because her shortness of breath and cough. Has had some sputum production. Wheezing. Difficult to get blood from. X-ray shows chronic lung disease. Not hypoxic. Will give steroids and antibiotics. Will follow-up with PCP as needed. I personally performed the services described in this documentation, which was scribed in my presence. The recorded information has been reviewed and is accurate.       Davonna Belling, MD 01/19/16 [phone]

## 2016-01-19 NOTE — Discharge Instructions (Signed)
Asthma, Adult Asthma is a recurring condition in which the airways tighten and narrow. Asthma can make it difficult to breathe. It can cause coughing, wheezing, and shortness of breath. Asthma episodes, also called asthma attacks, range from minor to life-threatening. Asthma cannot be cured, but medicines and lifestyle changes can help control it. CAUSES Asthma is believed to be caused by inherited (genetic) and environmental factors, but its exact cause is unknown. Asthma may be triggered by allergens, lung infections, or irritants in the air. Asthma triggers are different for each person. Common triggers include:   Animal dander.  Dust mites.  Cockroaches.  Pollen from trees or grass.  Mold.  Smoke.  Air pollutants such as dust, household cleaners, hair sprays, aerosol sprays, paint fumes, strong chemicals, or strong odors.  Cold air, weather changes, and winds (which increase molds and pollens in the air).  Strong emotional expressions such as crying or laughing hard.  Stress.  Certain medicines (such as aspirin) or types of drugs (such as beta-blockers).  Sulfites in foods and drinks. Foods and drinks that may contain sulfites include dried fruit, potato chips, and sparkling grape juice.  Infections or inflammatory conditions such as the flu, a cold, or an inflammation of the nasal membranes (rhinitis).  Gastroesophageal reflux disease (GERD).  Exercise or strenuous activity. SYMPTOMS Symptoms may occur immediately after asthma is triggered or many hours later. Symptoms include:  Wheezing.  Excessive nighttime or early morning coughing.  Frequent or severe coughing with a common cold.  Chest tightness.  Shortness of breath. DIAGNOSIS  The diagnosis of asthma is made by a review of your medical history and a physical exam. Tests may also be performed. These may include:  Lung function studies. These tests show how much air you breathe in and out.  Allergy  tests.  Imaging tests such as X-rays. TREATMENT  Asthma cannot be cured, but it can usually be controlled. Treatment involves identifying and avoiding your asthma triggers. It also involves medicines. There are 2 classes of medicine used for asthma treatment:   Controller medicines. These prevent asthma symptoms from occurring. They are usually taken every day.  Reliever or rescue medicines. These quickly relieve asthma symptoms. They are used as needed and provide short-term relief. Your health care provider will help you create an asthma action plan. An asthma action plan is a written plan for managing and treating your asthma attacks. It includes a list of your asthma triggers and how they may be avoided. It also includes information on when medicines should be taken and when their dosage should be changed. An action plan may also involve the use of a device called a peak flow meter. A peak flow meter measures how well the lungs are working. It helps you monitor your condition. HOME CARE INSTRUCTIONS   Take medicines only as directed by your health care provider. Speak with your health care provider if you have questions about how or when to take the medicines.  Use a peak flow meter as directed by your health care provider. Record and keep track of readings.  Understand and use the action plan to help minimize or stop an asthma attack without needing to seek medical care.  Control your home environment in the following ways to help prevent asthma attacks:  Do not smoke. Avoid being exposed to secondhand smoke.  Change your heating and air conditioning filter regularly.  Limit your use of fireplaces and wood stoves.  Get rid of pests (such as roaches   and mice) and their droppings.  Throw away plants if you see mold on them.  Clean your floors and dust regularly. Use unscented cleaning products.  Try to have someone else vacuum for you regularly. Stay out of rooms while they are  being vacuumed and for a short while afterward. If you vacuum, use a dust mask from a hardware store, a double-layered or microfilter vacuum cleaner bag, or a vacuum cleaner with a HEPA filter.  Replace carpet with wood, tile, or vinyl flooring. Carpet can trap dander and dust.  Use allergy-proof pillows, mattress covers, and box spring covers.  Wash bed sheets and blankets every week in hot water and dry them in a dryer.  Use blankets that are made of polyester or cotton.  Clean bathrooms and kitchens with bleach. If possible, have someone repaint the walls in these rooms with mold-resistant paint. Keep out of the rooms that are being cleaned and painted.  Wash hands frequently. SEEK MEDICAL CARE IF:   You have wheezing, shortness of breath, or a cough even if taking medicine to prevent attacks.  The colored mucus you cough up (sputum) is thicker than usual.  Your sputum changes from clear or white to yellow, green, gray, or bloody.  You have any problems that may be related to the medicines you are taking (such as a rash, itching, swelling, or trouble breathing).  You are using a reliever medicine more than 2-3 times per week.  Your peak flow is still at 50-79% of your personal best after following your action plan for 1 hour.  You have a fever. SEEK IMMEDIATE MEDICAL CARE IF:   You seem to be getting worse and are unresponsive to treatment during an asthma attack.  You are short of breath even at rest.  You get short of breath when doing very little physical activity.  You have difficulty eating, drinking, or talking due to asthma symptoms.  You develop chest pain.  You develop a fast heartbeat.  You have a bluish color to your lips or fingernails.  You are light-headed, dizzy, or faint.  Your peak flow is less than 50% of your personal best.   This information is not intended to replace advice given to you by your health care provider. Make sure you discuss any  questions you have with your health care provider.   Document Released: 07/06/2005 Document Revised: 03/27/2015 Document Reviewed: 02/02/2013 Elsevier Interactive Patient Education 2016 [employer].  

## 2016-01-28 ENCOUNTER — Other Ambulatory Visit: Payer: Self-pay

## 2016-01-28 DIAGNOSIS — I10 Essential (primary) hypertension: Secondary | ICD-10-CM | POA: Diagnosis not present

## 2016-01-28 DIAGNOSIS — R7309 Other abnormal glucose: Secondary | ICD-10-CM | POA: Diagnosis not present

## 2016-01-28 DIAGNOSIS — Z87898 Personal history of other specified conditions: Secondary | ICD-10-CM

## 2016-01-28 DIAGNOSIS — E785 Hyperlipidemia, unspecified: Secondary | ICD-10-CM | POA: Diagnosis not present

## 2016-01-28 DIAGNOSIS — R7303 Prediabetes: Secondary | ICD-10-CM | POA: Diagnosis not present

## 2016-01-29 LAB — COMPLETE METABOLIC PANEL WITH GFR
ALT: 7 U/L (ref 6–29)
AST: 12 U/L (ref 10–35)
Albumin: 3.3 g/dL — ABNORMAL LOW (ref 3.6–5.1)
Alkaline Phosphatase: 69 U/L (ref 33–130)
BUN: 17 mg/dL (ref 7–25)
CO2: 30 mmol/L (ref 20–31)
Calcium: 8.9 mg/dL (ref 8.6–10.4)
Chloride: 99 mmol/L (ref 98–110)
Creat: 0.92 mg/dL (ref 0.60–0.93)
GFR, Est African American: 71 mL/min (ref >=60)
GFR, Est Non African American: 62 mL/min (ref >=60)
Glucose, Bld: 89 mg/dL (ref 65–99)
Potassium: 3.4 mmol/L — ABNORMAL LOW (ref 3.5–5.3)
Sodium: 139 mmol/L (ref 135–146)
Total Bilirubin: 0.8 mg/dL (ref 0.2–1.2)
Total Protein: 6.5 g/dL (ref 6.1–8.1)

## 2016-01-29 LAB — HEMOGLOBIN A1C
Hgb A1c MFr Bld: 5.6 % (ref <5.7)
Mean Plasma Glucose: 114 mg/dL

## 2016-01-29 LAB — TSH: TSH: 1.83 mIU/L

## 2016-01-29 LAB — LIPID PANEL
Cholesterol: 139 mg/dL (ref 125–200)
HDL: 43 mg/dL — ABNORMAL LOW (ref >=46)
LDL Cholesterol: 79 mg/dL (ref <130)
Total CHOL/HDL Ratio: 3.2 Ratio (ref <=5.0)
Triglycerides: 86 mg/dL (ref <150)
VLDL: 17 mg/dL (ref <30)

## 2016-01-30 ENCOUNTER — Encounter: Payer: Self-pay | Admitting: Family Medicine

## 2016-01-30 ENCOUNTER — Ambulatory Visit (INDEPENDENT_AMBULATORY_CARE_PROVIDER_SITE_OTHER): Payer: Medicare Other | Admitting: Family Medicine

## 2016-01-30 VITALS — BP 124/82 | HR 75 | Resp 16 | Ht 64.0 in | Wt 237.1 lb

## 2016-01-30 DIAGNOSIS — I10 Essential (primary) hypertension: Secondary | ICD-10-CM

## 2016-01-30 DIAGNOSIS — E785 Hyperlipidemia, unspecified: Secondary | ICD-10-CM

## 2016-01-30 DIAGNOSIS — Z1211 Encounter for screening for malignant neoplasm of colon: Secondary | ICD-10-CM | POA: Diagnosis not present

## 2016-01-30 DIAGNOSIS — J4521 Mild intermittent asthma with (acute) exacerbation: Secondary | ICD-10-CM

## 2016-01-30 DIAGNOSIS — Z23 Encounter for immunization: Secondary | ICD-10-CM | POA: Diagnosis not present

## 2016-01-30 DIAGNOSIS — K8020 Calculus of gallbladder without cholecystitis without obstruction: Secondary | ICD-10-CM

## 2016-01-30 DIAGNOSIS — J302 Other seasonal allergic rhinitis: Secondary | ICD-10-CM

## 2016-01-30 MED ORDER — MONTELUKAST SODIUM 10 MG PO TABS: 10.0000 mg | ORAL_TABLET | Freq: Every day | ORAL | Status: DC

## 2016-01-30 MED ORDER — METFORMIN HCL 500 MG PO TABS: ORAL_TABLET | ORAL | Status: DC

## 2016-01-30 MED ORDER — LOSARTAN POTASSIUM-HCTZ 100-25 MG PO TABS: 1.0000 | ORAL_TABLET | Freq: Every day | ORAL | Status: DC

## 2016-01-30 MED ORDER — POTASSIUM CHLORIDE CRYS ER 20 MEQ PO TBCR: 20.0000 meq | EXTENDED_RELEASE_TABLET | Freq: Two times a day (BID) | ORAL | Status: DC

## 2016-01-30 MED ORDER — FLUTICASONE FUROATE-VILANTEROL 200-25 MCG/INH IN AEPB: 1.0000 | INHALATION_SPRAY | Freq: Every day | RESPIRATORY_TRACT | Status: DC

## 2016-01-30 MED ORDER — ALBUTEROL SULFATE HFA 108 (90 BASE) MCG/ACT IN AERS: 2.0000 | INHALATION_SPRAY | Freq: Four times a day (QID) | RESPIRATORY_TRACT | Status: DC | PRN

## 2016-01-30 NOTE — Patient Instructions (Addendum)
Annual physical exam in 2.5 month, call if you need me sooner  Increase potrassium to one twice daily New for asthma is Breo daily and singulair one daily  Pneumonia 23 vaccine today  My condolence re your  Recent loss  Please work on good  health habits so that your health will improve. 1. Commitment to daily physical activity for 30 to 60  minutes, if you are able to do this.  2. Commitment to wise food choices. Aim for half of your  food intake to be vegetable and fruit, one quarter starchy foods, and one quarter protein. Try to eat on a regular schedule  3 meals per day, snacking between meals should be limited to vegetables or fruits or small portions of nuts. 64 ounces of water per day is generally recommended, unless you have specific health conditions, like heart failure or kidney failure where you will need to limit fluid intake.  3. Commitment to sufficient and a  good quality of physical and mental rest daily, generally between 6 to 8 hours per day.  WITH PERSISTANCE AND PERSEVERANCE, THE IMPOSSIBLE , BECOMES THE NORM!  Thank you  for choosing [facility]. We consider it a privelige to serve you.  Delivering excellent health care in a caring and  compassionate way is our goal.  Partnering with you,  so that together we can achieve this goal is our strategy.

## 2016-01-30 NOTE — Assessment & Plan Note (Addendum)
Acute flare is Resolved, however , needs daily prophylactic  medication

## 2016-01-30 NOTE — Progress Notes (Signed)
   Heather [name]     MRN: [id number]      DOB: [birth year]   HPI Heather [name] is here for follow up and re-evaluation of chronic medical conditions, medication management and review of any available recent lab and radiology data.  Preventive health is updated, specifically  Cancer screening and Immunization.   Currently grieving sudden unexpected passing of her sibling. C/o increased nasal congestion and dry cough Recently evaluated in the Ed for shortness of breath and wheezing, states she could not breathe, has used breo in the past with good effect and wants this medication back  ROS Denies recent fever or chills.  Denies chest pains, palpitations and leg swelling Denies abdominal pain, nausea, vomiting,diarrhea or constipation.   Denies dysuria, frequency, hesitancy or incontinence. Denies joint pain, swelling and limitation in mobility. Denies headaches, seizures, numbness, or tingling.  Denies skin break down or rash.   PE  BP 124/82 mmHg  Pulse 75  Resp 16  Ht 5\' 4"  (1.626 m)  Wt 237 lb 1.9 oz (107.557 kg)  BMI 40.68 kg/m2  SpO2 96%  Patient alert and oriented and in no cardiopulmonary distress.  HEENT: No facial asymmetry, EOMI,   oropharynx pink and moist.  Neck supple no JVD, no mass.erythema and edema of nasal mucosa, no sinus tenderness  Chest: Clear to auscultation bilaterally.  CVS: S1, S2 no murmurs, no S3.Regular rate.  ABD: Soft non tender.   Ext: No edema  MS: Adequate ROM spine, shoulders, hips and knees.  Skin: Intact, no ulcerations or rash noted.  Psych: Good eye contact, normal affect. Memory intact not anxious mildly  depressed appearing and tearful at times as she recalls the recent unexpected death of her sibling  CNS: CN 2-12 intact, power,  normal throughout.no focal deficits noted.   Assessment & Plan  Asthma Acute flare is Resolved, however , needs daily prophylactic  medication  Essential hypertension Controlled, no change in  medication DASH diet and commitment to daily physical activity for a minimum of 30 minutes discussed and encouraged, as a part of hypertension management. The importance of attaining a healthy weight is also discussed.  BP/Weight 01/30/2016 01/19/2016 11/12/2015 05/09/2015 04/12/2015 11/14/2014 [id number]  Systolic BP A999333 [id number] A999333 Q000111Q  Diastolic BP 82 64 80 84 67 68 84  Wt. (Lbs) 237.12 225 245.7 253 239 250 251  BMI 40.68 38.6 39.68 40.85 39.15 44.3 44.47        MORBID OBESITY Improved. Pt applauded on succesful weight loss through lifestyle change, and encouraged to continue same. Weight loss goal set for the next several months.   Dyslipidemia Hyperlipidemia:Low fat diet discussed and encouraged.   Lipid Panel  Lab Results  Component Value Date   CHOL 139 01/28/2016   HDL 43* 01/28/2016   LDLCALC 79 01/28/2016   TRIG 86 01/28/2016   CHOLHDL 3.2 01/28/2016  needs to coomit to daily aerobic exercise      Seasonal allergies Needs to remain on daily singulair  Gallstones Asymptomatic , noted on scan in 2016

## 2016-02-02 NOTE — Assessment & Plan Note (Signed)
Hyperlipidemia:Low fat diet discussed and encouraged.   Lipid Panel  Lab Results  Component Value Date   CHOL 139 01/28/2016   HDL 43* 01/28/2016   LDLCALC 79 01/28/2016   TRIG 86 01/28/2016   CHOLHDL 3.2 01/28/2016  needs to coomit to daily aerobic exercise

## 2016-02-02 NOTE — Assessment & Plan Note (Signed)
Asymptomatic , noted on scan in 2016

## 2016-02-02 NOTE — Assessment & Plan Note (Signed)
Improved. Pt applauded on succesful weight loss through lifestyle change, and encouraged to continue same. Weight loss goal set for the next several months.  

## 2016-02-02 NOTE — Assessment & Plan Note (Signed)
Needs to remain on daily singulair

## 2016-02-02 NOTE — Assessment & Plan Note (Signed)
Controlled, no change in medication DASH diet and commitment to daily physical activity for a minimum of 30 minutes discussed and encouraged, as a part of hypertension management. The importance of attaining a healthy weight is also discussed.  BP/Weight 01/30/2016 01/19/2016 11/12/2015 05/09/2015 04/12/2015 11/14/2014 A999333  Systolic BP A999333 Q000111Q [id number] [id number] A999333 Q000111Q  Diastolic BP 82 64 80 84 67 68 84  Wt. (Lbs) 237.12 225 245.7 253 239 250 251  BMI 40.68 38.6 39.68 40.85 39.15 44.3 44.47

## 2016-02-04 ENCOUNTER — Encounter: Payer: Medicare Other | Admitting: Family Medicine

## 2016-02-05 ENCOUNTER — Other Ambulatory Visit: Payer: Self-pay | Admitting: Family Medicine

## 2016-02-05 DIAGNOSIS — Z09 Encounter for follow-up examination after completed treatment for conditions other than malignant neoplasm: Secondary | ICD-10-CM

## 2016-02-05 DIAGNOSIS — R921 Mammographic calcification found on diagnostic imaging of breast: Secondary | ICD-10-CM

## 2016-02-15 ENCOUNTER — Other Ambulatory Visit: Payer: Self-pay

## 2016-02-15 MED ORDER — DIPHENOXYLATE-ATROPINE 2.5-0.025 MG PO TABS: 1.0000 | ORAL_TABLET | Freq: Four times a day (QID) | ORAL | 0 refills | Status: DC | PRN

## 2016-02-18 ENCOUNTER — Encounter (HOSPITAL_COMMUNITY): Payer: Medicare Other

## 2016-02-18 ENCOUNTER — Ambulatory Visit (HOSPITAL_COMMUNITY)
Admission: RE | Admit: 2016-02-18 | Discharge: 2016-02-18 | Disposition: A | Discharge status: Home / Self Care | Payer: Medicare Other | Source: Ambulatory Visit | Attending: Family Medicine | Admitting: Family Medicine

## 2016-02-18 DIAGNOSIS — R921 Mammographic calcification found on diagnostic imaging of breast: Secondary | ICD-10-CM | POA: Diagnosis not present

## 2016-02-18 DIAGNOSIS — Z09 Encounter for follow-up examination after completed treatment for conditions other than malignant neoplasm: Secondary | ICD-10-CM

## 2016-05-06 ENCOUNTER — Telehealth: Payer: Self-pay

## 2016-05-06 NOTE — Telephone Encounter (Signed)
Gastroenterology Pre-Procedure Review  Request Date: 05/06/2016 Requesting Physician: Dr. Moshe Cipro  PATIENT REVIEW QUESTIONS: The patient responded to the following health history questions as indicated:    1. Diabetes Melitis: PRE-DIABETIC 2. Joint replacements in the past 12 months: no 3. Major health problems in the past 3 months: no 4. Has an artificial valve or MVP: no 5. Has a defibrillator: no 6. Has been advised in past to take antibiotics in advance of a procedure like teeth cleaning: no 7. Family history of colon cancer: no  8. Alcohol Use: no 9. History of sleep apnea: no  10. History of coronary artery or other vascular stents placed within the last 12 months: no    MEDICATIONS & ALLERGIES:    Patient reports the following regarding taking any blood thinners:   Plavix? no Aspirin? no Coumadin? no Brilinta? no Xarelto? no Eliquis? no Pradaxa? no Savaysa? no Effient? no  Patient confirms/reports the following medications:  Current Outpatient Prescriptions  Medication Sig Dispense Refill  . albuterol (PROVENTIL HFA;VENTOLIN HFA) 108 (90 Base) MCG/ACT inhaler Inhale 2 puffs into the lungs every 6 (six) hours as needed for wheezing or shortness of breath. 18 g 3  . cholecalciferol (VITAMIN D) 400 UNITS TABS tablet Take 400 Units by mouth daily.    . diphenoxylate-atropine (LOMOTIL) 2.5-0.025 MG tablet Take 1 tablet by mouth every 6 (six) hours as needed. diarrhea 12 tablet 0  . fluticasone furoate-vilanterol (BREO ELLIPTA) 200-25 MCG/INH AEPB Inhale 1 puff into the lungs daily. 28 each 4  . losartan-hydrochlorothiazide (HYZAAR) 100-25 MG tablet Take 1 tablet by mouth daily. 90 tablet 1  . metFORMIN (GLUCOPHAGE) 500 MG tablet TAKE ONE TABLET TWICE DAILY WITH A MEAL 180 tablet 1  . potassium chloride SA (K-DUR,KLOR-CON) 20 MEQ tablet Take 1 tablet (20 mEq total) by mouth 2 (two) times daily. 180 tablet 3  . montelukast (SINGULAIR) 10 MG tablet Take 1 tablet (10 mg total)  by mouth at bedtime. (Patient not taking: Reported on 05/06/2016) 30 tablet 3   No current facility-administered medications for this visit.     Patient confirms/reports the following allergies:  Allergies  Allergen Reactions  . Aspirin Anaphylaxis and Swelling  . Penicillins Swelling    Causes throat to swell Has patient had a PCN reaction causing immediate rash, facial/tongue/throat swelling, SOB or lightheadedness with hypotension: Yes Has patient had a PCN reaction causing severe rash involving mucus membranes or skin necrosis: No Has patient had a PCN reaction that required hospitalization No Has patient had a PCN reaction occurring within the last 10 years: No If all of the above answers are "NO", then may proceed with Cephalosporin use.     No orders of the defined types were placed in this encounter.   AUTHORIZATION INFORMATION Primary Insurance:   ID #: Group #:  Pre-Cert / Auth required:  Pre-Cert / Auth #:   Secondary Insurance:   ID #:   Group #:  Pre-Cert / Auth required:  Pre-Cert / Auth #:   SCHEDULE INFORMATION: Procedure has been scheduled as follows:  Date: 05/25/2016                  Time:  2:00 pm Location: [place]  This Gastroenterology Pre-Precedure Review Form is being routed to the following provider(s): Barney Drain, MD

## 2016-05-06 NOTE — Telephone Encounter (Signed)
Pt received a triage letter from [facility]. Please call [phone]

## 2016-05-06 NOTE — Telephone Encounter (Signed)
See separate triage.  

## 2016-05-07 NOTE — Telephone Encounter (Signed)
FULL LIQUIDS WITH BREAKFAST.  MOVI PREP SPLIT DOSING-.CONTINUE GLUCOPHAGE.

## 2016-05-13 ENCOUNTER — Other Ambulatory Visit: Payer: Self-pay

## 2016-05-13 DIAGNOSIS — Z1211 Encounter for screening for malignant neoplasm of colon: Secondary | ICD-10-CM

## 2016-05-13 MED ORDER — PEG-KCL-NACL-NASULF-NA ASC-C 100 G PO SOLR: 1.0000 | ORAL | 0 refills | Status: DC

## 2016-05-13 NOTE — Telephone Encounter (Signed)
Rx sent to the pharmacy and instructions mailed to pt.  

## 2016-05-14 MED ORDER — PEG 3350-KCL-NA BICARB-NACL 420 G PO SOLR: 4000.0000 mL | ORAL | 0 refills | Status: DC

## 2016-05-14 NOTE — Telephone Encounter (Signed)
Movie prep not covered. Sending in [place] and mailing new instructions.

## 2016-05-19 ENCOUNTER — Telehealth: Payer: Self-pay

## 2016-05-19 NOTE — Telephone Encounter (Signed)
PA not required for screening colonoscopy. Spoke to [place].

## 2016-05-25 ENCOUNTER — Encounter (HOSPITAL_COMMUNITY)
Admission: RE | Disposition: A | Discharge status: Home / Self Care | Payer: Self-pay | Source: Ambulatory Visit | Attending: Gastroenterology

## 2016-05-25 ENCOUNTER — Telehealth: Payer: Self-pay | Admitting: Gastroenterology

## 2016-05-25 ENCOUNTER — Encounter (HOSPITAL_COMMUNITY): Payer: Self-pay | Admitting: *Deleted

## 2016-05-25 ENCOUNTER — Ambulatory Visit (HOSPITAL_COMMUNITY)
Admission: RE | Admit: 2016-05-25 | Discharge: 2016-05-25 | Disposition: A | Discharge status: Home / Self Care | Payer: Medicare Other | Source: Ambulatory Visit | Attending: Gastroenterology | Admitting: Gastroenterology

## 2016-05-25 DIAGNOSIS — Z5309 Procedure and treatment not carried out because of other contraindication: Secondary | ICD-10-CM | POA: Diagnosis not present

## 2016-05-25 DIAGNOSIS — Z853 Personal history of malignant neoplasm of breast: Secondary | ICD-10-CM | POA: Insufficient documentation

## 2016-05-25 DIAGNOSIS — I110 Hypertensive heart disease with heart failure: Secondary | ICD-10-CM | POA: Diagnosis not present

## 2016-05-25 DIAGNOSIS — Z886 Allergy status to analgesic agent status: Secondary | ICD-10-CM | POA: Diagnosis not present

## 2016-05-25 DIAGNOSIS — I2510 Atherosclerotic heart disease of native coronary artery without angina pectoris: Secondary | ICD-10-CM | POA: Insufficient documentation

## 2016-05-25 DIAGNOSIS — K5730 Diverticulosis of large intestine without perforation or abscess without bleeding: Secondary | ICD-10-CM | POA: Insufficient documentation

## 2016-05-25 DIAGNOSIS — I509 Heart failure, unspecified: Secondary | ICD-10-CM | POA: Diagnosis not present

## 2016-05-25 DIAGNOSIS — Z8249 Family history of ischemic heart disease and other diseases of the circulatory system: Secondary | ICD-10-CM | POA: Insufficient documentation

## 2016-05-25 DIAGNOSIS — J45909 Unspecified asthma, uncomplicated: Secondary | ICD-10-CM | POA: Insufficient documentation

## 2016-05-25 DIAGNOSIS — Z880 Allergy status to penicillin: Secondary | ICD-10-CM | POA: Diagnosis not present

## 2016-05-25 DIAGNOSIS — Z87891 Personal history of nicotine dependence: Secondary | ICD-10-CM | POA: Insufficient documentation

## 2016-05-25 DIAGNOSIS — K6389 Other specified diseases of intestine: Secondary | ICD-10-CM | POA: Diagnosis not present

## 2016-05-25 DIAGNOSIS — Z1212 Encounter for screening for malignant neoplasm of rectum: Secondary | ICD-10-CM | POA: Diagnosis not present

## 2016-05-25 DIAGNOSIS — Z79899 Other long term (current) drug therapy: Secondary | ICD-10-CM | POA: Diagnosis not present

## 2016-05-25 DIAGNOSIS — Z7984 Long term (current) use of oral hypoglycemic drugs: Secondary | ICD-10-CM | POA: Insufficient documentation

## 2016-05-25 DIAGNOSIS — Z800 Family history of malignant neoplasm of digestive organs: Secondary | ICD-10-CM | POA: Insufficient documentation

## 2016-05-25 DIAGNOSIS — Z90710 Acquired absence of both cervix and uterus: Secondary | ICD-10-CM | POA: Diagnosis not present

## 2016-05-25 DIAGNOSIS — Z803 Family history of malignant neoplasm of breast: Secondary | ICD-10-CM | POA: Diagnosis not present

## 2016-05-25 DIAGNOSIS — Z1211 Encounter for screening for malignant neoplasm of colon: Secondary | ICD-10-CM

## 2016-05-25 DIAGNOSIS — K219 Gastro-esophageal reflux disease without esophagitis: Secondary | ICD-10-CM | POA: Diagnosis not present

## 2016-05-25 DIAGNOSIS — Z801 Family history of malignant neoplasm of trachea, bronchus and lung: Secondary | ICD-10-CM | POA: Insufficient documentation

## 2016-05-25 HISTORY — PX: FLEXIBLE SIGMOIDOSCOPY: SHX5431

## 2016-05-25 LAB — GLUCOSE, CAPILLARY
Glucose-Capillary: 62 mg/dL — ABNORMAL LOW (ref 65–99)
Glucose-Capillary: 111 mg/dL — ABNORMAL HIGH (ref 65–99)

## 2016-05-25 SURGERY — SIGMOIDOSCOPY, FLEXIBLE
Anesthesia: Moderate Sedation

## 2016-05-25 MED ORDER — MEPERIDINE HCL 100 MG/ML IJ SOLN
INTRAMUSCULAR | Status: DC | PRN
  Administered 2016-05-25: 50 mg via INTRAVENOUS
  Administered 2016-05-25 (×2): 25 mg via INTRAVENOUS

## 2016-05-25 MED ORDER — MEPERIDINE HCL 100 MG/ML IJ SOLN
INTRAMUSCULAR | Status: AC
  Filled 2016-05-25: qty 2

## 2016-05-25 MED ORDER — STERILE WATER FOR IRRIGATION IR SOLN
Status: DC | PRN
  Administered 2016-05-25: 100 mL

## 2016-05-25 MED ORDER — MIDAZOLAM HCL 5 MG/5ML IJ SOLN
INTRAMUSCULAR | Status: AC
  Filled 2016-05-25: qty 10

## 2016-05-25 MED ORDER — MIDAZOLAM HCL 5 MG/5ML IJ SOLN
INTRAMUSCULAR | Status: DC | PRN
  Administered 2016-05-25: 1 mg via INTRAVENOUS
  Administered 2016-05-25 (×2): 2 mg via INTRAVENOUS

## 2016-05-25 MED ORDER — LINACLOTIDE 72 MCG PO CAPS: ORAL_CAPSULE | ORAL | 11 refills | Status: DC

## 2016-05-25 MED ORDER — LIDOCAINE HCL 2 % EX GEL
CUTANEOUS | Status: DC | PRN
  Administered 2016-05-25: 1

## 2016-05-25 MED ORDER — DEXTROSE-NACL 5-0.9 % IV SOLN
INTRAVENOUS | Status: DC | PRN
  Administered 2016-05-25: 125 mL/h via INTRAVENOUS

## 2016-05-25 MED ORDER — SODIUM CHLORIDE 0.9 % IV SOLN
INTRAVENOUS | Status: DC
  Administered 2016-05-25: 1000 mL via INTRAVENOUS

## 2016-05-25 MED ORDER — LIDOCAINE HCL 2 % EX GEL
CUTANEOUS | Status: AC
  Filled 2016-05-25: qty 30

## 2016-05-25 MED ORDER — DEXTROSE 5 % IV SOLN: INTRAVENOUS | Status: DC

## 2016-05-25 NOTE — Telephone Encounter (Signed)
  PT SHOULD HAVE A SCREENING OLONOSCOPY PERFORMED AT [facility] WITH FLUOROSCOPY, AN OVERTUBE, AND PROPOFOL WITHIN THE NEXT YEAR.

## 2016-05-25 NOTE — Discharge Instructions (Signed)
You have diverticulosis in your left colon. I could not complete your colonoscopy because you have a redundant/FLOPPY LEFT COLON.   YOU SHOULD HAVE A COLONOSCOPY PERFORMED AT [facility] WITH FLUOROSCOPY, AN OVERTUBE, AND PROPOFOL WITHIN THE NEXT YEAR.  DRINK WATER TO KEEP YOUR URINE LIGHT YELLOW.  FOLLOW A HIGH FIBER DIET. AVOID ITEMS THAT CAUSE BLOATING & GAS. See info below.  Colonoscopy Care After Read the instructions outlined below and refer to this sheet in the next week. These discharge instructions provide you with general information on caring for yourself after you leave the hospital. While your treatment has been planned according to the most current medical practices available, unavoidable complications occasionally occur. If you have any problems or questions after discharge, call DR. FIELDS, [phone].  ACTIVITY  You may resume your regular activity, but move at a slower pace for the next 24 hours.   Take frequent rest periods for the next 24 hours.   Walking will help get rid of the air and reduce the bloated feeling in your belly (abdomen).   No driving for 24 hours (because of the medicine (anesthesia) used during the test).   You may shower.   Do not sign any important legal documents or operate any machinery for 24 hours (because of the anesthesia used during the test).    NUTRITION  Drink plenty of fluids.   You may resume your normal diet as instructed by your doctor.   Begin with a light meal and progress to your normal diet. Heavy or fried foods are harder to digest and may make you feel sick to your stomach (nauseated).   Avoid alcoholic beverages for 24 hours or as instructed.    MEDICATIONS  You may resume your normal medications.   WHAT YOU CAN EXPECT TODAY  Some feelings of bloating in the abdomen.   Passage of more gas than usual.   Spotting of blood in your stool or on the toilet paper  .  IF YOU HAD POLYPS REMOVED DURING THE  COLONOSCOPY:  Eat a soft diet IF YOU HAVE NAUSEA, BLOATING, ABDOMINAL PAIN, OR VOMITING.    FINDING OUT THE RESULTS OF YOUR TEST Not all test results are available during your visit. DR. Oneida Alar WILL CALL YOU WITHIN 7 DAYS OF YOUR PROCEDUE WITH YOUR RESULTS. Do not assume everything is normal if you have not heard from DR. FIELDS IN ONE WEEK, CALL HER OFFICE AT [phone].  SEEK IMMEDIATE MEDICAL ATTENTION AND CALL THE OFFICE: [phone] IF:  You have more than a spotting of blood in your stool.   Your belly is swollen (abdominal distention).   You are nauseated or vomiting.   You have a temperature over 101F.   You have abdominal pain or discomfort that is severe or gets worse throughout the day.  High-Fiber Diet A high-fiber diet changes your normal diet to include more whole grains, legumes, fruits, and vegetables. Changes in the diet involve replacing refined carbohydrates with unrefined foods. The calorie level of the diet is essentially unchanged. The Dietary Reference Intake (recommended amount) for adult males is 38 grams per day. For adult females, it is 25 grams per day. Pregnant and lactating women should consume 28 grams of fiber per day. Fiber is the intact part of a plant that is not broken down during digestion. Functional fiber is fiber that has been isolated from the plant to provide a beneficial effect in the body. PURPOSE  Increase stool bulk.   Ease and regulate bowel  movements.   Lower cholesterol.   REDUCE RISK OF COLON CANCER   INDICATIONS THAT YOU NEED MORE FIBER  Constipation and hemorrhoids.   Uncomplicated diverticulosis (intestine condition) and irritable bowel syndrome.   Weight management.   As a protective measure against hardening of the arteries (atherosclerosis), diabetes, and cancer.   GUIDELINES FOR INCREASING FIBER IN THE DIET  Start adding fiber to the diet slowly. A gradual increase of about 5 more grams (2 slices of whole-wheat  bread, 2 servings of most fruits or vegetables, or 1 bowl of high-fiber cereal) per day is best. Too rapid an increase in fiber may result in constipation, flatulence, and bloating.   Drink enough water and fluids to keep your urine clear or pale yellow. Water, juice, or caffeine-free drinks are recommended. Not drinking enough fluid may cause constipation.   Eat a variety of high-fiber foods rather than one type of fiber.   Try to increase your intake of fiber through using high-fiber foods rather than fiber pills or supplements that contain small amounts of fiber.   The goal is to change the types of food eaten. Do not supplement your present diet with high-fiber foods, but replace foods in your present diet.   INCLUDE A VARIETY OF FIBER SOURCES  Replace refined and processed grains with whole grains, canned fruits with fresh fruits, and incorporate other fiber sources. White rice, white breads, and most bakery goods contain little or no fiber.   Brown whole-grain rice, buckwheat oats, and many fruits and vegetables are all good sources of fiber. These include: broccoli, Brussels sprouts, cabbage, cauliflower, beets, sweet potatoes, white potatoes (skin on), carrots, tomatoes, eggplant, squash, berries, fresh fruits, and dried fruits.   Cereals appear to be the richest source of fiber. Cereal fiber is found in whole grains and bran. Bran is the fiber-rich outer coat of cereal grain, which is largely removed in refining. In whole-grain cereals, the bran remains. In breakfast cereals, the largest amount of fiber is found in those with "bran" in their names. The fiber content is sometimes indicated on the label.   You may need to include additional fruits and vegetables each day.   In baking, for 1 cup white flour, you may use the following substitutions:   1 cup whole-wheat flour minus 2 tablespoons.   1/2 cup white flour plus 1/2 cup whole-wheat flour.   Diverticulosis Diverticulosis is a  common condition that develops when small pouches (diverticula) form in the wall of the colon. The risk of diverticulosis increases with age. It happens more often in people who eat a low-fiber diet. Most individuals with diverticulosis have no symptoms. Those individuals with symptoms usually experience belly (abdominal) pain, constipation, or loose stools (diarrhea).  HOME CARE INSTRUCTIONS  Increase the amount of fiber in your diet as directed by your caregiver or dietician. This may reduce symptoms of diverticulosis.   Drink at least 6 to 8 glasses of water each day to prevent constipation.   Try not to strain when you have a bowel movement.   Avoiding nuts and seeds to prevent complications is NOT NECESSARY.   FOODS HAVING HIGH FIBER CONTENT INCLUDE:  Fruits. Apple, peach, pear, tangerine, raisins, prunes.   Vegetables. Brussels sprouts, asparagus, broccoli, cabbage, carrot, cauliflower, romaine lettuce, spinach, summer squash, tomato, winter squash, zucchini.   Starchy Vegetables. Baked beans, kidney beans, lima beans, split peas, lentils, potatoes (with skin).   Grains. Whole wheat bread, brown rice, bran flake cereal, plain oatmeal, white rice,  shredded wheat, bran muffins.    SEEK IMMEDIATE MEDICAL CARE IF:  You develop increasing pain or severe bloating.   You have an oral temperature above 101F.   You develop vomiting or bowel movements that are bloody or black.

## 2016-05-25 NOTE — Op Note (Signed)
[facility] Patient Name: Heather [name] Procedure Date: 05/25/2016 2:09 PM MRN: [id number] Date of Birth: 11/25/1940 Attending MD: Barney Drain , MD CSN: [id number] Age: 75 Admit Type: Outpatient Procedure:                Colonoscopy, SCREENING/INCOMPLETE Indications:              Screening for colorectal malignant neoplasm Providers:                Barney Drain, MD, Rosina Lowenstein, RN, Isabella Stalling,                            Technician Referring MD:             Norwood Levo. Simpson MD, MD Medicines:                Meperidine 100 mg IV, Midazolam 5 mg IV Complications:            No immediate complications. Estimated Blood Loss:     Estimated blood loss: none. Procedure:                Pre-Anesthesia Assessment:                           - Prior to the procedure, a History and Physical                            was performed, and patient medications and                            allergies were reviewed. The patient's tolerance of                            previous anesthesia was also reviewed. The risks                            and benefits of the procedure and the sedation                            options and risks were discussed with the patient.                            All questions were answered, and informed consent                            was obtained. Prior Anticoagulants: The patient has                            taken no previous anticoagulant or antiplatelet                            agents. ASA Grade Assessment: II - A patient with                            mild systemic disease. After reviewing the risks  and benefits, the patient was deemed in                            satisfactory condition to undergo the procedure.                            After obtaining informed consent, the colonoscope                            was passed under direct vision. Throughout the                            procedure, the patient's blood  pressure, pulse, and                            oxygen saturations were monitored continuously. The                            [id number] scope was introduced through                            the anus and advanced to the the descending colon.                            The rectum was photographed. The colonoscopy was                            extremely difficult due to restricted mobility of                            the colon and significant looping. Successful                            completion of the procedure was aided by increasing                            the dose of sedation medication, using manual                            pressure, straightening and shortening the scope to                            obtain bowel loop reduction, applying abdominal                            pressure and COLOWRAP. The patient tolerated the                            procedure. The quality of the bowel preparation was                            good. Scope In: 2:47:46 PM Scope Out: 3:05:58 PM Total Procedure Duration: 0 hours 18 minutes 12 seconds  Findings:  Many small and large-mouthed diverticula were found in the sigmoid colon       and descending colon.      The recto-sigmoid colon and sigmoid colon revealed significantly       excessive looping. Advancing the scope required changing the patient to       a supine position, using manual pressure, withdrawing the scope and       replacing with the pediatric colonoscope, straightening and shortening       the scope to obtain bowel loop reduction and COLOWRAP.      The rectum appeared normal. Impression:               - Diverticulosis in the sigmoid colon and in the                            descending colon.                           - There was significant looping of the colon AND                            SCOPE UNABLE TO BE PASSED BEYOND TEH DESCENDING                            COLON DUE TO LOOPING AND PATENT  DISCOMFORT.                           - The rectum is normal. Moderate Sedation:      Moderate (conscious) sedation was administered by the endoscopy nurse       and supervised by the endoscopist. The following parameters were       monitored: oxygen saturation, heart rate, blood pressure, and response       to care. Total physician intraservice time was 34 minutes. Recommendation:           - High fiber diet.                           - Continue present medications.                           - Repeat colonoscopy in 1 year for surveillance.                           - Patient has a contact number available for                            emergencies. The signs and symptoms of potential                            delayed complications were discussed with the                            patient. Return to normal activities tomorrow.                            Written discharge instructions were provided to the  patient. Procedure Code(s):        --- Professional ---                           [phone], 89, Colonoscopy, flexible; diagnostic,                            including collection of specimen(s) by brushing or                            washing, when performed (separate procedure)                           99152, Moderate sedation services provided by the                            same physician or other qualified health care                            professional performing the diagnostic or                            therapeutic service that the sedation supports,                            requiring the presence of an independent trained                            observer to assist in the monitoring of the                            patient's level of consciousness and physiological                            status; initial 15 minutes of intraservice time,                            patient age 17 years or older                           [phone], Moderate  sedation services; each additional                            15 minutes intraservice time Diagnosis Code(s):        --- Professional ---                           Z12.11, Encounter for screening for malignant                            neoplasm of colon                           K57.30, Diverticulosis of large intestine without  perforation or abscess without bleeding CPT copyright 2016 [employer]. All rights reserved. The codes documented in this report are preliminary and upon coder review may  be revised to meet current compliance requirements. Barney Drain, MD Barney Drain, MD 05/25/2016 3:23:00 PM This report has been signed electronically. Number of Addenda: 0

## 2016-05-25 NOTE — H&P (Signed)
Primary Care Physician:  [name], MD Primary Gastroenterologist:  Dr. Oneida Alar  Pre-Procedure History & Physical: HPI:  Heather [name] is a 75 y.o. female here for [place].  Past Medical History:  Diagnosis Date  . Asthma   . Breast cancer ([place]) 1998   left  . Congestive heart failure ([place])   . FH: CAD (coronary artery disease)   . GERD (gastroesophageal reflux disease)   . Heart failure   . Hypertension 1998  . 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 Personal history of malignant neoplasm of breast     Past Surgical History:  Procedure Laterality Date  . ABDOMINAL HYSTERECTOMY  1973   fibroids  . BREAST SURGERY Left 1998   breast ca  . MASTECTOMY  1997   left breast   . TOTAL ABDOMINAL HYSTERECTOMY W/ BILATERAL SALPINGOOPHORECTOMY  1971   unilateral   . VESICOVAGINAL FISTULA CLOSURE W/ TAH  1971    Prior to Admission medications   Medication Sig Start Date End Date Taking? Authorizing Provider  cholecalciferol (VITAMIN D) 400 UNITS TABS tablet Take 400 Units by mouth daily.   Yes Historical Provider, MD  losartan-hydrochlorothiazide (HYZAAR) 100-25 MG tablet Take 1 tablet by mouth daily. 01/30/16  Yes Fayrene Helper, MD  metFORMIN (GLUCOPHAGE) 500 MG tablet TAKE ONE TABLET TWICE DAILY WITH A MEAL 01/30/16  Yes Fayrene Helper, MD  potassium chloride SA (K-DUR,KLOR-CON) 20 MEQ tablet Take 1 tablet (20 mEq total) by mouth 2 (two) times daily. 01/30/16 01/29/17 Yes Fayrene Helper, MD  albuterol (PROVENTIL HFA;VENTOLIN HFA) 108 (90 Base) MCG/ACT inhaler Inhale 2 puffs into the lungs every 6 (six) hours as needed for wheezing or shortness of breath. 01/30/16 07/01/16  Fayrene Helper, MD  fluticasone furoate-vilanterol (BREO ELLIPTA) 200-25 MCG/INH AEPB Inhale 1 puff into the lungs daily. Patient taking differently: Inhale 1 puff into the lungs daily as  needed (shortness of breath).  01/30/16 07/01/16  Fayrene Helper, MD    Allergies as of 05/13/2016 - Review Complete 05/06/2016  Allergen Reaction Noted  . Aspirin Anaphylaxis and Swelling   . Penicillins Swelling 08/28/2013    Family History  Problem Relation Age of Onset  . Parkinsonism Mother   . Coronary artery disease      Family History of males   . Cancer Sister     breast   . Cancer Sister     lung   . Cancer Brother     throat     Social History   Social History  . Marital status: Divorced    Spouse name: N/A  . Number of children: 6  . Years of education: N/A   Occupational History  . disabled in [place] Topics  . Smoking status: Former Smoker    Years: 40.00    Types: Cigarettes    Quit date: 07/19/1998  . Smokeless tobacco: Never Used  . Alcohol use No  . Drug use: No  . Sexual activity: Not Currently   Other Topics Concern  . Not on file   Social History Narrative  . No narrative on file    Review of Systems: See HPI, otherwise negative ROS   Physical Exam: BP (!) 146/72   Pulse 77   Temp 98.3 F (36.8 C) (Oral)   SpO2 97%  General:  Alert,  pleasant and cooperative in NAD Head:  Normocephalic and atraumatic. Neck:  Supple; Lungs:  Clear throughout to auscultation.    Heart:  Regular rate and rhythm. Abdomen:  Soft, nontender and nondistended. Normal bowel sounds, without guarding, and without rebound.   Neurologic:  Alert and  oriented x4;  grossly normal neurologically.  Impression/Plan:     SCREENING  Plan:  1. TCS TODAY. DISCUSSED PROCEDURE, BENEFITS, & RISKS: < 1% chance of medication reaction, bleeding, perforation, or rupture of spleen/liver.

## 2016-05-26 ENCOUNTER — Encounter (HOSPITAL_COMMUNITY): Payer: Self-pay | Admitting: Gastroenterology

## 2016-05-26 NOTE — Telephone Encounter (Signed)
I have faxed over the orders to [facility]

## 2016-05-28 ENCOUNTER — Encounter: Payer: Self-pay | Admitting: Family Medicine

## 2016-05-28 ENCOUNTER — Ambulatory Visit (INDEPENDENT_AMBULATORY_CARE_PROVIDER_SITE_OTHER): Payer: Medicare Other | Admitting: Family Medicine

## 2016-05-28 VITALS — BP 120/74 | HR 63 | Resp 16 | Ht 64.0 in | Wt 236.1 lb

## 2016-05-28 DIAGNOSIS — Z23 Encounter for immunization: Secondary | ICD-10-CM | POA: Diagnosis not present

## 2016-05-28 DIAGNOSIS — Z0000 Encounter for general adult medical examination without abnormal findings: Secondary | ICD-10-CM | POA: Diagnosis not present

## 2016-05-28 DIAGNOSIS — E559 Vitamin D deficiency, unspecified: Secondary | ICD-10-CM | POA: Diagnosis not present

## 2016-05-28 DIAGNOSIS — I10 Essential (primary) hypertension: Secondary | ICD-10-CM | POA: Diagnosis not present

## 2016-05-28 DIAGNOSIS — E785 Hyperlipidemia, unspecified: Secondary | ICD-10-CM

## 2016-05-28 DIAGNOSIS — R7301 Impaired fasting glucose: Secondary | ICD-10-CM | POA: Diagnosis not present

## 2016-05-28 LAB — BASIC METABOLIC PANEL WITH GFR
BUN: 13 mg/dL (ref 7–25)
CO2: 27 mmol/L (ref 20–31)
Calcium: 9.2 mg/dL (ref 8.6–10.4)
Chloride: 102 mmol/L (ref 98–110)
Creat: 0.99 mg/dL — ABNORMAL HIGH (ref 0.60–0.93)
GFR, Est African American: 64 mL/min (ref >=60)
GFR, Est Non African American: 56 mL/min — ABNORMAL LOW (ref >=60)
Glucose, Bld: 94 mg/dL (ref 65–99)
Potassium: 4.3 mmol/L (ref 3.5–5.3)
Sodium: 139 mmol/L (ref 135–146)

## 2016-05-28 LAB — CBC
HCT: 37.6 % (ref 35.0–45.0)
Hemoglobin: 12.2 g/dL (ref 11.7–15.5)
MCH: 30.1 pg (ref 27.0–33.0)
MCHC: 32.4 g/dL (ref 32.0–36.0)
MCV: 92.8 fL (ref 80.0–100.0)
MPV: 9.7 fL (ref 7.5–12.5)
Platelets: 342 10*3/uL (ref 140–400)
RBC: 4.05 MIL/uL (ref 3.80–5.10)
RDW: 13.6 % (ref 11.0–15.0)
WBC: 8.1 10*3/uL (ref 3.8–10.8)

## 2016-05-28 LAB — HEMOGLOBIN A1C
Hgb A1c MFr Bld: 5.4 % (ref <5.7)
Mean Plasma Glucose: 108 mg/dL

## 2016-05-28 NOTE — Assessment & Plan Note (Signed)

## 2016-05-28 NOTE — Patient Instructions (Signed)
Annual wellness in 4.5 month, call if you need me sooner  Fl;u vaccine today  You need diagnostic mammogram in February  You will have a colonoscopy at [facility] in the next 12 months, Dr Oneida Alar is arranging this  Labs today  Please work on good  health habits so that your health will improve. 1. Commitment to daily physical activity for 30 to 60  minutes, if you are able to do this.  2. Commitment to wise food choices. Aim for half of your  food intake to be vegetable and fruit, one quarter starchy foods, and one quarter protein. Try to eat on a regular schedule  3 meals per day, snacking between meals should be limited to vegetables or fruits or small portions of nuts. 64 ounces of water per day is generally recommended, unless you have specific health conditions, like heart failure or kidney failure where you will need to limit fluid intake.  3. Commitment to sufficient and a  good quality of physical and mental rest daily, generally between 6 to 8 hours per day.  WITH PERSISTANCE AND PERSEVERANCE, THE IMPOSSIBLE , BECOMES THE NORM!  Thank you  for choosing [facility]. We consider it a privelige to serve you.  Delivering excellent health care in a caring and  compassionate way is our goal.  Partnering with you,  so that together we can achieve this goal is our strategy.

## 2016-05-28 NOTE — Assessment & Plan Note (Signed)
After obtaining informed consent, the vaccine is  administered by LPN.  

## 2016-05-28 NOTE — Progress Notes (Signed)
    Heather [name]     MRN: [id number]      DOB: 19-Mar-1941  HPI: Patient is in for annual physical exam. Recent colonoscopy is reviewed with the patient, she needs re exam in 12 months, due to excessive diverticulosis and incomplete exam Immunization is reviewed , and  updated if needed.   PE: Pleasant  female, alert and oriented x 3, in no cardio-pulmonary distress. Afebrile. HEENT No facial trauma or asymetry. Sinuses non tender.  Extra occullar muscles intact, pupils equally reactive to light. External ears normal, tympanic membranes clear.right partially occluded by cerumen Oropharynx moist, no exudate. Neck: supple, no adenopathy,JVD or thyromegaly.No bruits.  Chest: Clear to ascultation bilaterally.No crackles or wheezes. Non tender to palpation  Breast: no masses or lumps.Left mastectomy No tenderness. No nipple discharge or inversion. No axillary or supraclavicular adenopathy  Cardiovascular system; Heart sounds normal,  S1 and  S2 ,no S3.  No murmur, or thrill. Apical beat not displaced Peripheral pulses normal.  Abdomen: Soft, non tender, no organomegaly or masses. No bruits. Bowel sounds normal. No guarding, tenderness or rebound.  Rectal:  Deferred, due to recent colonoscopy earlier this week  GU: External genitalia normal female genitalia , no internal exam done as pt denies any pelvic symptoms, is not currently sexually active and she had a hysterectomy for benign reasons  Musculoskeletal exam: Decreased though adequate  ROM of spine, hips , shoulders and knees. No deformity ,swelling or crepitus noted. No muscle wasting or atrophy.   Neurologic: Cranial nerves 2 to 12 intact. Power, tone ,sensation and reflexes normal throughout. No disturbance in gait. No tremor.  Skin: Intact, no ulceration, erythema , scaling or rash noted. Pigmentation normal throughout  Psych; Normal mood and affect. Judgement and concentration normal   Assessment &  Plan:  Annual physical exam Annual exam as documented. Counseling done  re healthy lifestyle involving commitment to 150 minutes exercise per week, heart healthy diet, and attaining healthy weight.The importance of adequate sleep also discussed. Regular seat belt use and home safety, is also discussed. Changes in health habits are decided on by the patient with goals and time frames  set for achieving them. Immunization and cancer screening needs are specifically addressed at this visit.   Need for prophylactic vaccination and inoculation against influenza After obtaining informed consent, the vaccine is  administered by LPN.

## 2016-05-29 LAB — VITAMIN D 25 HYDROXY (VIT D DEFICIENCY, FRACTURES): Vit D, 25-Hydroxy: 50 ng/mL (ref 30–100)

## 2016-05-29 NOTE — Patient Instructions (Signed)
Talked with [facility] today and they are going to call her today and get her scheduled for her TCS.

## 2016-06-15 DIAGNOSIS — H2513 Age-related nuclear cataract, bilateral: Secondary | ICD-10-CM | POA: Diagnosis not present

## 2016-07-16 DIAGNOSIS — E669 Obesity, unspecified: Secondary | ICD-10-CM | POA: Insufficient documentation

## 2016-07-16 DIAGNOSIS — Z9189 Other specified personal risk factors, not elsewhere classified: Secondary | ICD-10-CM | POA: Insufficient documentation

## 2016-07-16 DIAGNOSIS — R7303 Prediabetes: Secondary | ICD-10-CM | POA: Insufficient documentation

## 2016-07-16 DIAGNOSIS — Z853 Personal history of malignant neoplasm of breast: Secondary | ICD-10-CM | POA: Insufficient documentation

## 2016-08-19 ENCOUNTER — Other Ambulatory Visit: Payer: Self-pay | Admitting: Family Medicine

## 2016-09-28 ENCOUNTER — Ambulatory Visit: Payer: Medicare Other

## 2016-10-05 ENCOUNTER — Ambulatory Visit (INDEPENDENT_AMBULATORY_CARE_PROVIDER_SITE_OTHER): Payer: Medicare Other

## 2016-10-05 VITALS — BP 122/80 | HR 73 | Temp 99.1°F | Ht 64.0 in | Wt 240.1 lb

## 2016-10-05 DIAGNOSIS — Z0000 Encounter for general adult medical examination without abnormal findings: Secondary | ICD-10-CM | POA: Diagnosis not present

## 2016-10-05 NOTE — Progress Notes (Signed)
Subjective:   Heather [name] is a 76 y.o. female who presents for an Initial Medicare Annual Wellness Visit.  Review of Systems: Cardiac Risk Factors include: advanced age (>29men, >81 women);dyslipidemia;hypertension;obesity (BMI >30kg/m2)     Objective:    Today's Vitals   10/05/16 1017  BP: 122/80  Pulse: 73  Temp: 99.1 F (37.3 C)  TempSrc: Oral  SpO2: 94%  Weight: 240 lb 1.3 oz (108.9 kg)  Height: 5\' 4"  (1.626 m)   Body mass index is 41.21 kg/m.   Current Medications (verified) Outpatient Encounter Prescriptions as of 10/05/2016  Medication Sig  . albuterol (PROVENTIL HFA;VENTOLIN HFA) 108 (90 Base) MCG/ACT inhaler Inhale 2 puffs into the lungs every 6 (six) hours as needed for wheezing or shortness of breath.  . cholecalciferol (VITAMIN D) 400 UNITS TABS tablet Take 400 Units by mouth daily.  . fluticasone furoate-vilanterol (BREO ELLIPTA) 100-25 MCG/INH AEPB Inhale 1 puff into the lungs daily as needed.  Marland Kitchen losartan-hydrochlorothiazide (HYZAAR) 100-25 MG tablet TAKE ONE (1) TABLET BY MOUTH EVERY DAY  . metFORMIN (GLUCOPHAGE) 500 MG tablet TAKE ONE TABLET TWICE DAILY WITH A MEAL  . potassium chloride SA (K-DUR,KLOR-CON) 20 MEQ tablet Take 1 tablet (20 mEq total) by mouth 2 (two) times daily.   No facility-administered encounter medications on file as of 10/05/2016.     Allergies (verified) Aspirin and Penicillins   History: Past Medical History:  Diagnosis Date  . Asthma   . Breast cancer ([place]) 1998   left  . Congestive heart failure ([place])   . FH: CAD (coronary artery disease)   . GERD (gastroesophageal reflux disease)   . Heart failure   . Hypertension 1998  . 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 Personal history of malignant neoplasm of breast    Past Surgical History:  Procedure Laterality Date  . ABDOMINAL HYSTERECTOMY  1973   fibroids  . BREAST SURGERY  Left 1998   breast ca  . FLEXIBLE SIGMOIDOSCOPY N/A 05/25/2016   Procedure: FLEXIBLE SIGMOIDOSCOPY;  Surgeon: Danie Binder, MD;  Location: [facility];  Service: Endoscopy;  Laterality: N/A;  2:00 AM  . MASTECTOMY  1997   left breast   . TOTAL ABDOMINAL HYSTERECTOMY W/ BILATERAL SALPINGOOPHORECTOMY  1971   unilateral   . 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 Parkinsonism Mother   . Prostate cancer Father   . Kidney disease Sister     nephrectomy  . Coronary artery disease      Family History of males   . Breast cancer Sister   . Lung cancer Sister   . Breast cancer Sister   . Cervical cancer Sister   . Cancer Brother     throat    Social History   Occupational History  . disabled in [place] Topics  . Smoking status: Former Smoker    Years: 40.00    Types: Cigarettes    Quit date: 07/19/1998  . Smokeless tobacco: Never Used  . Alcohol use No  . Drug use: No  . Sexual activity: Not Currently    Tobacco Counseling Counseling given: Not Answered   Activities of Daily Living In your present state of health, do you have any difficulty performing the following activities: 10/05/2016  Hearing? N  Vision? N  Difficulty concentrating or making decisions? N  Walking or climbing stairs? Y  Dressing or bathing? N  Doing errands, shopping? Y  Preparing Food and eating ? N  Using the Toilet? N  In the past six months, have you accidently leaked urine? N  Do you have problems with loss of bowel control? N  Managing your Medications? N  Managing your Finances? N  Housekeeping or managing your Housekeeping? N  Some recent data might be hidden    Immunizations and Health Maintenance Immunization History  Administered Date(s) Administered  . Influenza Whole 05/06/2006, 05/25/2007, 05/06/2009, 03/27/2010  . Influenza,inj,Quad PF,36+ Mos 04/12/2013, 05/30/2014, 05/09/2015, 05/28/2016  .  Pneumococcal Polysaccharide-23 02/14/2007, 01/30/2016  . Td 02/14/2007   There are no preventive care reminders to display for this patient.  Patient Care Team: Fayrene Helper, MD as PCP - [place], PA-C as Physician Assistant (Oncology)  Indicate any recent Medical Services you may have received from other than [facility] providers in the past year (date may be approximate).     Assessment:   This is a routine wellness examination for [facility].  Hearing/Vision screen No exam data present  Dietary issues and exercise activities discussed: Current Exercise Habits: Home exercise routine, Type of exercise: stretching, Time (Minutes): 25, Frequency (Times/Week): 7, Weekly Exercise (Minutes/Week): 175, Intensity: Mild, Exercise limited by: None identified  Goals    . Increase water intake          Recommend increasing water intake to 8, 8 ounce glasses a day (64 ounces).      Depression Screen PHQ 2/9 Scores 10/05/2016 01/30/2016 12/26/2013  PHQ - 2 Score 0 1 0  PHQ- 9 Score - 6 2    Fall Risk Fall Risk  10/05/2016 05/09/2015 12/26/2013  Falls in the past year? No Yes No  Number falls in past yr: - 1 -  Injury with Fall? - No -    Cognitive Function:   Normal 6CIT Screen 10/05/2016  What Year? 0 points  What month? 0 points  What time? 0 points  Count back from 20 0 points  Months in reverse 0 points  Repeat phrase 0 points  Total Score 0    Screening Tests Health Maintenance  Topic Date Due  . PNA vac Low Risk Adult (2 of 2 - PCV13) 01/29/2017  . TETANUS/TDAP  02/13/2017  . COLONOSCOPY  05/25/2026  . INFLUENZA VACCINE  Completed  . DEXA SCAN  Completed      Plan:    I have personally reviewed and addressed the Medicare Annual Wellness questionnaire and have noted the following in the patient's chart:  A. Medical and social history B. Use of alcohol, tobacco or illicit drugs  C. Current medications and supplements D. Functional ability and status E.    Nutritional status F.  Physical activity G. Advance directives H. List of other physicians I.  Hospitalizations, surgeries, and ER visits in previous 12 months J.  [place] to include cognitive, depression, and falls L. Referrals and appointments - none  In addition, I have reviewed and discussed with patient certain preventive protocols, quality metrics, and best practice recommendations. A written personalized care plan for preventive services as well as general preventive health recommendations were provided to patient.  Signed,   Stormy Fabian, LPN Lead Nurse Health Advisor

## 2016-10-05 NOTE — Patient Instructions (Addendum)
[name] , Thank you for taking time to come for your Medicare Wellness Visit. I appreciate your ongoing commitment to your health goals. Please review the following plan we discussed and let me know if I can assist you in the future.   Screening recommendations/referrals: Colonoscopy: Up to date, Due 05/2017 Mammogram: Up to date, due 02/2027 Bone Density: Due NOW, discuss with Dr. Moshe Cipro at your next visit Recommended yearly ophthalmology/optometry visit for glaucoma screening and checkup Recommended yearly dental visit for hygiene and checkup  Vaccinations: Influenza vaccine: Up to date, due 05/2017 Pneumococcal vaccine: Due for Prevnar 13 in 01/2017 Tdap vaccine: Up to date. Due 01/2017 Shingles vaccine: Due NOW, even though you decline at this time if you decide you want this please call our office.    Advanced directives: Advance directive discussed with you today. I have provided a copy for you to complete at home and have notarized. Once this is complete please bring a copy in to our office so we can scan it into your chart.  Conditions/risks identified: Obesity, recommend increasing exercise to at least 30-45 minutes at a time and to eat 3 balanced meals a day.  Next appointment: Follow up with Dr. Moshe Cipro in 2 months. Follow up in 1 year for your annual wellness visit.   Preventive Care 8 Years and Older, Female Preventive care refers to lifestyle choices and visits with your health care provider that can promote health and wellness. What does preventive care include?  A yearly physical exam. This is also called an annual well check.  Dental exams once or twice a year.  Routine eye exams. Ask your health care provider how often you should have your eyes checked.  Personal lifestyle choices, including:  Daily care of your teeth and gums.  Regular physical activity.  Eating a healthy diet.  Avoiding tobacco and drug use.  Limiting alcohol use.  Practicing safe  sex.  Taking low-dose aspirin every day.  Taking vitamin and mineral supplements as recommended by your health care provider. What happens during an annual well check? The services and screenings done by your health care provider during your annual well check will depend on your age, overall health, lifestyle risk factors, and family history of disease. Counseling  Your health care provider may ask you questions about your:  Alcohol use.  Tobacco use.  Drug use.  Emotional well-being.  Home and relationship well-being.  Sexual activity.  Eating habits.  History of falls.  Memory and ability to understand (cognition).  Work and work Statistician.  Reproductive health. Screening  You may have the following tests or measurements:  Height, weight, and BMI.  Blood pressure.  Lipid and cholesterol levels. These may be checked every 5 years, or more frequently if you are over 82 years old.  Skin check.  Lung cancer screening. You may have this screening every year starting at age 45 if you have a 30-pack-year history of smoking and currently smoke or have quit within the past 15 years.  Fecal occult blood test (FOBT) of the stool. You may have this test every year starting at age 80.  Flexible sigmoidoscopy or colonoscopy. You may have a sigmoidoscopy every 5 years or a colonoscopy every 10 years starting at age 72.  Hepatitis C blood test.  Hepatitis B blood test.  Sexually transmitted disease (STD) testing.  Diabetes screening. This is done by checking your blood sugar (glucose) after you have not eaten for a while (fasting). You may have  this done every 1-3 years.  Bone density scan. This is done to screen for osteoporosis. You may have this done starting at age 60.  Mammogram. This may be done every 1-2 years. Talk to your health care provider about how often you should have regular mammograms. Talk with your health care provider about your test results,  treatment options, and if necessary, the need for more tests. Vaccines  Your health care provider may recommend certain vaccines, such as:  Influenza vaccine. This is recommended every year.  Tetanus, diphtheria, and acellular pertussis (Tdap, Td) vaccine. You may need a Td booster every 10 years.  Zoster vaccine. You may need this after age 15.  Pneumococcal 13-valent conjugate (PCV13) vaccine. One dose is recommended after age 34.  Pneumococcal polysaccharide (PPSV23) vaccine. One dose is recommended after age 84. Talk to your health care provider about which screenings and vaccines you need and how often you need them. This information is not intended to replace advice given to you by your health care provider. Make sure you discuss any questions you have with your health care provider. Document Released: 08/02/2015 Document Revised: 03/25/2016 Document Reviewed: 05/07/2015 Elsevier Interactive Patient Education  2017 [place] Prevention in the Home Falls can cause injuries. They can happen to people of all ages. There are many things you can do to make your home safe and to help prevent falls. What can I do on the outside of my home?  Regularly fix the edges of walkways and driveways and fix any cracks.  Remove anything that might make you trip as you walk through a door, such as a raised step or threshold.  Trim any bushes or trees on the path to your home.  Use bright outdoor lighting.  Clear any walking paths of anything that might make someone trip, such as rocks or tools.  Regularly check to see if handrails are loose or broken. Make sure that both sides of any steps have handrails.  Any raised decks and porches should have guardrails on the edges.  Have any leaves, snow, or ice cleared regularly.  Use sand or salt on walking paths during winter.  Clean up any spills in your garage right away. This includes oil or grease spills. What can I do in the  bathroom?  Use night lights.  Install grab bars by the toilet and in the tub and shower. Do not use towel bars as grab bars.  Use non-skid mats or decals in the tub or shower.  If you need to sit down in the shower, use a plastic, non-slip stool.  Keep the floor dry. Clean up any water that spills on the floor as soon as it happens.  Remove soap buildup in the tub or shower regularly.  Attach bath mats securely with double-sided non-slip rug tape.  Do not have throw rugs and other things on the floor that can make you trip. What can I do in the bedroom?  Use night lights.  Make sure that you have a light by your bed that is easy to reach.  Do not use any sheets or blankets that are too big for your bed. They should not hang down onto the floor.  Have a firm chair that has side arms. You can use this for support while you get dressed.  Do not have throw rugs and other things on the floor that can make you trip. What can I do in the kitchen?  Clean up any  spills right away.  Avoid walking on wet floors.  Keep items that you use a lot in easy-to-reach places.  If you need to reach something above you, use a strong step stool that has a grab bar.  Keep electrical cords out of the way.  Do not use floor polish or wax that makes floors slippery. If you must use wax, use non-skid floor wax.  Do not have throw rugs and other things on the floor that can make you trip. What can I do with my stairs?  Do not leave any items on the stairs.  Make sure that there are handrails on both sides of the stairs and use them. Fix handrails that are broken or loose. Make sure that handrails are as long as the stairways.  Check any carpeting to make sure that it is firmly attached to the stairs. Fix any carpet that is loose or worn.  Avoid having throw rugs at the top or bottom of the stairs. If you do have throw rugs, attach them to the floor with carpet tape.  Make sure that you have a  light switch at the top of the stairs and the bottom of the stairs. If you do not have them, ask someone to add them for you. What else can I do to help prevent falls?  Wear shoes that:  Do not have high heels.  Have rubber bottoms.  Are comfortable and fit you well.  Are closed at the toe. Do not wear sandals.  If you use a stepladder:  Make sure that it is fully opened. Do not climb a closed stepladder.  Make sure that both sides of the stepladder are locked into place.  Ask someone to hold it for you, if possible.  Clearly mark and make sure that you can see:  Any grab bars or handrails.  First and last steps.  Where the edge of each step is.  Use tools that help you move around (mobility aids) if they are needed. These include:  Canes.  Walkers.  Scooters.  Crutches.  Turn on the lights when you go into a dark area. Replace any light bulbs as soon as they burn out.  Set up your furniture so you have a clear path. Avoid moving your furniture around.  If any of your floors are uneven, fix them.  If there are any pets around you, be aware of where they are.  Review your medicines with your doctor. Some medicines can make you feel dizzy. This can increase your chance of falling. Ask your doctor what other things that you can do to help prevent falls. This information is not intended to replace advice given to you by your health care provider. Make sure you discuss any questions you have with your health care provider. Document Released: 05/02/2009 Document Revised: 12/12/2015 Document Reviewed: 08/10/2014 Elsevier Interactive Patient Education  2017 [employer].

## 2016-10-06 ENCOUNTER — Ambulatory Visit: Payer: Medicare Other

## 2016-11-13 ENCOUNTER — Encounter (HOSPITAL_COMMUNITY): Payer: Self-pay

## 2016-11-13 ENCOUNTER — Encounter (HOSPITAL_COMMUNITY): Payer: Medicare Other | Attending: Oncology | Admitting: Oncology

## 2016-11-13 VITALS — BP 151/79 | HR 76 | Temp 98.0°F | Resp 20 | Wt 244.0 lb

## 2016-11-13 DIAGNOSIS — Z803 Family history of malignant neoplasm of breast: Secondary | ICD-10-CM

## 2016-11-13 DIAGNOSIS — Z8041 Family history of malignant neoplasm of ovary: Secondary | ICD-10-CM | POA: Diagnosis not present

## 2016-11-13 DIAGNOSIS — C50912 Malignant neoplasm of unspecified site of left female breast: Secondary | ICD-10-CM

## 2016-11-13 DIAGNOSIS — Z853 Personal history of malignant neoplasm of breast: Secondary | ICD-10-CM

## 2016-11-13 NOTE — Patient Instructions (Addendum)
[place] at [facility] Discharge Instructions  RECOMMENDATIONS MADE BY THE CONSULTANT AND ANY TEST RESULTS WILL BE SENT TO YOUR REFERRING PHYSICIAN.  You were seen today by Dr. Twana First Follow up in a year   Thank you for choosing [place] at [facility] to provide your oncology and hematology care.  To afford each patient quality time with our provider, please arrive at least 15 minutes before your scheduled appointment time.    If you have a lab appointment with the [place] please come in thru the  Main Entrance and check in at the main information desk  You need to re-schedule your appointment should you arrive 10 or more minutes late.  We strive to give you quality time with our providers, and arriving late affects you and other patients whose appointments are after yours.  Also, if you no show three or more times for appointments you may be dismissed from the clinic at the providers discretion.     Again, thank you for choosing [facility].  Our hope is that these requests will decrease the amount of time that you wait before being seen by our physicians.       _____________________________________________________________  Should you have questions after your visit to [facility], please contact our office at [phone] between the hours of 8:30 a.m. and 4:30 p.m.  Voicemails left after 4:30 p.m. will not be returned until the following business day.  For prescription refill requests, have your pharmacy contact our office.       Resources For Cancer Patients and their Caregivers ? American Cancer Society: Can assist with transportation, wigs, general needs, runs Look Good Feel Better.        930-804-7494 ? Cancer Care: Provides financial assistance, online support groups, medication/co-pay assistance.  1-800-813-HOPE 640-850-5593) ? [place] Assists [place] cancer  patients and their families through emotional , educational and financial support.  205-454-1277 ? [place] DSS Where to apply for food stamps, Medicaid and utility assistance. 480-546-7257 ? [employer]: Transportation to medical appointments. 249-099-5281 ? Social Security Administration: May apply for disability if have a Stage IV cancer. 4632501684 432-471-0562 ? [employer], Disability and Transit Services: Assists with nutrition, care and transit needs. [place] Support Programs: @10RELATIVEDAYS @ > Cancer Support Group  2nd Tuesday of the month 1pm-2pm, Journey Room  > Creative Journey  3rd Tuesday of the month 1130am-1pm, Journey Room  > Look Good Feel Better  1st Wednesday of the month 10am-12 noon, Journey Room (Call [place] to register [phone])

## 2016-11-13 NOTE — Progress Notes (Signed)
Heather Nakayama, MD [address] [place] [address]  No diagnosis found.  CURRENT THERAPY: Surveillance per NCCN guidelines  Oncology History   stage II B. 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 All therapy finished as of September 1998      History of Invasive medullary carcinoma of left breast   05/30/1996 Surgery    Modified radical mastectomy demonstrating a Stage IIB (T2N1M0) medullary carcinoma of left breast with 4/13 positive lymph nodes.  ER/PR negative.      08/07/1996 - 01/01/1997 Chemotherapy    CMFx 6 cycles      01/24/1997 - 04/16/1997 Radiation Therapy    Approximate start and completion dates.  A few breaks in radiation therapy due to desquamation      INTERVAL HISTORY: Heather [name] 76 y.o. female returns for followup of stage II B. 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 All therapy finished as of September 1998.  She presented for follow up today. She is doing well today other than diarrhea that started yesterday. She feels like her diarrhea is provoked by stress. Her sister is being put on hospice for stage IV ovarian cancer. Pt is tearful. Last mammogram was in August 2017 and it was negative except for chronic right breast calcifications. She has her colonoscopy scheduled for 12/11/16. She has back pain that has been present for several years. Denies chest pain, SOB, abdominal pain, or any other concerns.   Past Medical History:  Diagnosis Date  . Asthma   . Breast cancer ([place]) 1998   left  . Congestive heart failure ([place])   . FH: CAD  (coronary artery disease)   . GERD (gastroesophageal reflux disease)   . Heart failure   . Hypertension 1998  . 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 Personal history of malignant neoplasm of breast     has MORBID OBESITY; Essential hypertension; CONGESTIVE HEART FAILURE; Asthma; FATIGUE; History of Invasive medullary carcinoma of left breast; Dyslipidemia; Seasonal allergies; Annual physical exam; Gallstones; and Need for prophylactic vaccination and inoculation against influenza on her problem list.     is allergic to aspirin and penicillins.  Ms. Sukup had no medications administered during this visit.  Past Surgical History:  Procedure Laterality Date  . ABDOMINAL HYSTERECTOMY  1973   fibroids  . BREAST SURGERY Left 1998   breast ca  . FLEXIBLE SIGMOIDOSCOPY N/A 05/25/2016   Procedure: FLEXIBLE SIGMOIDOSCOPY;  Surgeon: Danie Binder, MD;  Location: [facility];  Service: Endoscopy;  Laterality: N/A;  2:00 AM  . MASTECTOMY  1997   left breast   . TOTAL ABDOMINAL HYSTERECTOMY W/ BILATERAL SALPINGOOPHORECTOMY  1971   unilateral   . VESICOVAGINAL FISTULA CLOSURE W/ TAH  1971   Review of Systems  Constitutional: Negative.   HENT: Negative.   Eyes: Negative.   Respiratory: Negative.  Negative for shortness of breath.   Cardiovascular: Negative.  Negative for chest pain.  Gastrointestinal: Positive for diarrhea. Negative for abdominal pain.  Genitourinary: Negative.   Musculoskeletal: Positive for back pain.  Skin: Negative.   Neurological: Negative.   Endo/Heme/Allergies: Negative.   Psychiatric/Behavioral: The patient is nervous/anxious (over sister's diagnosis).   All other systems reviewed and are negative. 14 point review of systems was performed and is negative except as detailed under history of present illness and above  PHYSICAL EXAMINATION ECOG PERFORMANCE STATUS: 0 -  Asymptomatic  Vitals:   11/13/16 1026  BP: (!) 151/79  Pulse: 76  Resp: 20  Temp: 98 F (36.7 C)   Physical Exam  Constitutional: She is oriented to person, place, and time and well-developed, well-nourished, and in no distress.  HENT:  Head: Normocephalic and atraumatic.  Eyes: EOM are normal. Pupils are equal, round, and reactive to light.  Neck: Normal range of motion. Neck supple.  Cardiovascular: Normal rate, regular rhythm and normal heart sounds.   Pulmonary/Chest: Effort normal and breath sounds normal.  Pt declined breast exam today.   Abdominal: Soft. Bowel sounds are normal.  Abdominal hernia  Musculoskeletal: Normal range of motion.  Neurological: She is alert and oriented to person, place, and time. Gait normal.  Skin: Skin is warm and dry.  Nursing note and vitals reviewed.  LABORATORY DATA: CBC    Component Value Date/Time   WBC 8.1 05/28/2016 1008   RBC 4.05 05/28/2016 1008   HGB 12.2 05/28/2016 1008   HCT 37.6 05/28/2016 1008   PLT 342 05/28/2016 1008   MCV 92.8 05/28/2016 1008   MCH 30.1 05/28/2016 1008   MCHC 32.4 05/28/2016 1008   RDW 13.6 05/28/2016 1008   LYMPHSABS 3.0 04/12/2015 1224   MONOABS 0.7 04/12/2015 1224   EOSABS 0.1 04/12/2015 1224   BASOSABS 0.0 04/12/2015 1224     Chemistry      Component Value Date/Time   NA 139 05/28/2016 1008   K 4.3 05/28/2016 1008   CL 102 05/28/2016 1008   CO2 27 05/28/2016 1008   BUN 13 05/28/2016 1008   CREATININE 0.99 (H) 05/28/2016 1008      Component Value Date/Time   CALCIUM 9.2 05/28/2016 1008   ALKPHOS 69 01/28/2016 0908   AST 12 01/28/2016 0908   ALT 7 01/28/2016 0908   BILITOT 0.8 01/28/2016 0908     RADIOLOGY: I have reviewed the images below and agree with the reported results  Mammogram 02/18/2016 IMPRESSION: Stable probably benign right breast calcifications. Recommend bilateral diagnostic mammography in 6 months.  ASSESSMENT AND PLAN:  Cancer Staging History of Invasive  medullary carcinoma of left breast Staging form: Breast, AJCC 6th Edition - Clinical: Stage IIB (T2, N1, M0) - Signed by Baird Cancer, PA-C on 10/03/2013 Clinically NED. Patient is 20 years out from her original cancer diagnosis, she is cured. She does have a strong family history of ovarian CA and breast cancer, therefore we will continue follow up with the patient.   Continue cancer surveillance mammogram and colonoscopy.   She will return for follow up in 1 year.    THERAPY PLAN:  NCCN guidelines recommends the following surveillance for invasive breast cancer (2.2016):  A. History and Physical exam 1-4 times per year as clinically appropriate for 5 years, then annually.  B. Periodic screening for changes in family history and referral to genetics counseling as indicated  C. Educate, monitor, and refer to lymphedema management.  D. Mammography every 12 months  E. Routine imaging of reconstructed breast is not indicated.  F. In the absence of clinical signs and symptoms suggestive  of recurrent disease, there is no indication for laboratory or imaging studies for metastases screening.  G. Women on Tamoxifen: annual gynecologic assessment every 12 months if uterus is present.  H. Women on aromatase inhibitor or who experience ovarian failure secondary to treatment should have monitoring of bone health with a bone mineral density determination at baseline and periodically thereafter.  I. Assess and encourage adherence to adjuvant endocrine therapy.  J. Evidence suggests that active lifestyle, healthy diet, limited alcohol intake, and achieving and maintaining an ideal body weight (20-25 BMI) may lead to optimal breast cancer outcomes.   All questions were answered. The patient knows to call the clinic with any problems, questions or concerns. We can certainly see the patient much sooner if necessary.  This document serves as a record of services personally performed by Twana First, MD. It  was created on her behalf by Martinique Casey, a trained medical scribe. The creation of this record is based on the scribe's personal observations and the provider's statements to them. This document has been checked and approved by the attending provider.  I have reviewed the above documentation for accuracy and completeness and I agree with the above.  This note is electronically signed by: Martinique M Casey 11/13/2016 10:27 AM

## 2016-11-27 DIAGNOSIS — Z853 Personal history of malignant neoplasm of breast: Secondary | ICD-10-CM | POA: Diagnosis not present

## 2016-11-27 DIAGNOSIS — I10 Essential (primary) hypertension: Secondary | ICD-10-CM | POA: Diagnosis not present

## 2016-11-27 DIAGNOSIS — Z87891 Personal history of nicotine dependence: Secondary | ICD-10-CM | POA: Diagnosis not present

## 2016-11-27 DIAGNOSIS — Z9012 Acquired absence of left breast and nipple: Secondary | ICD-10-CM | POA: Diagnosis not present

## 2016-11-27 DIAGNOSIS — J45909 Unspecified asthma, uncomplicated: Secondary | ICD-10-CM | POA: Diagnosis not present

## 2016-11-27 DIAGNOSIS — Z1211 Encounter for screening for malignant neoplasm of colon: Secondary | ICD-10-CM | POA: Diagnosis not present

## 2016-11-27 DIAGNOSIS — I491 Atrial premature depolarization: Secondary | ICD-10-CM | POA: Diagnosis not present

## 2016-11-27 DIAGNOSIS — E119 Type 2 diabetes mellitus without complications: Secondary | ICD-10-CM | POA: Diagnosis not present

## 2016-11-27 DIAGNOSIS — Z7984 Long term (current) use of oral hypoglycemic drugs: Secondary | ICD-10-CM | POA: Diagnosis not present

## 2016-11-27 DIAGNOSIS — Z8679 Personal history of other diseases of the circulatory system: Secondary | ICD-10-CM | POA: Insufficient documentation

## 2016-12-03 ENCOUNTER — Encounter: Payer: Self-pay | Admitting: Family Medicine

## 2016-12-03 ENCOUNTER — Ambulatory Visit: Payer: Medicare Other | Admitting: Family Medicine

## 2016-12-11 DIAGNOSIS — R7303 Prediabetes: Secondary | ICD-10-CM | POA: Diagnosis not present

## 2016-12-11 DIAGNOSIS — J45909 Unspecified asthma, uncomplicated: Secondary | ICD-10-CM | POA: Diagnosis not present

## 2016-12-11 DIAGNOSIS — Z1211 Encounter for screening for malignant neoplasm of colon: Secondary | ICD-10-CM | POA: Diagnosis not present

## 2016-12-11 DIAGNOSIS — K5730 Diverticulosis of large intestine without perforation or abscess without bleeding: Secondary | ICD-10-CM | POA: Diagnosis not present

## 2016-12-11 DIAGNOSIS — G4733 Obstructive sleep apnea (adult) (pediatric): Secondary | ICD-10-CM | POA: Diagnosis not present

## 2016-12-11 DIAGNOSIS — Z87891 Personal history of nicotine dependence: Secondary | ICD-10-CM | POA: Diagnosis not present

## 2016-12-11 DIAGNOSIS — I10 Essential (primary) hypertension: Secondary | ICD-10-CM | POA: Diagnosis not present

## 2016-12-11 DIAGNOSIS — K6389 Other specified diseases of intestine: Secondary | ICD-10-CM | POA: Diagnosis not present

## 2016-12-30 ENCOUNTER — Encounter: Payer: Self-pay | Admitting: Family Medicine

## 2016-12-30 ENCOUNTER — Ambulatory Visit (INDEPENDENT_AMBULATORY_CARE_PROVIDER_SITE_OTHER): Payer: Medicare Other | Admitting: Family Medicine

## 2016-12-30 VITALS — BP 128/84 | HR 75 | Resp 16 | Ht 64.0 in | Wt 245.0 lb

## 2016-12-30 DIAGNOSIS — J4520 Mild intermittent asthma, uncomplicated: Secondary | ICD-10-CM | POA: Diagnosis not present

## 2016-12-30 DIAGNOSIS — I10 Essential (primary) hypertension: Secondary | ICD-10-CM | POA: Diagnosis not present

## 2016-12-30 DIAGNOSIS — J3089 Other allergic rhinitis: Secondary | ICD-10-CM | POA: Diagnosis not present

## 2016-12-30 DIAGNOSIS — E785 Hyperlipidemia, unspecified: Secondary | ICD-10-CM | POA: Diagnosis not present

## 2016-12-30 MED ORDER — POTASSIUM CHLORIDE CRYS ER 20 MEQ PO TBCR: EXTENDED_RELEASE_TABLET | ORAL | 5 refills | Status: DC

## 2016-12-30 NOTE — Progress Notes (Signed)
   Heather [name]     MRN: [id number]      DOB: 26-Nov-1940   HPI Heather [name] is here for follow up and re-evaluation of chronic medical conditions, medication management and review of any available recent lab and radiology data.  Preventive health is updated, specifically  Cancer screening and Immunization.   Had colonoscopy at [facility] recently an did exceptionally well The PT denies any adverse reactions to current medications since the last visit.  There are no new concerns.  There are no specific complaints   ROS Denies recent fever or chills. Denies sinus pressure, nasal congestion, ear pain or sore throat. Denies chest congestion, productive cough or wheezing. Denies chest pains, palpitations and leg swelling Denies abdominal pain, nausea, vomiting,diarrhea or constipation.   Denies dysuria, frequency, hesitancy or incontinence. Denies joint pain, swelling and limitation in mobility. Denies headaches, seizures, numbness, or tingling. Denies depression, anxiety or insomnia. Denies skin break down or rash.   PE  BP 128/84   Pulse 75   Resp 16   Ht 5\' 4"  (1.626 m)   Wt 245 lb (111.1 kg)   SpO2 96%   BMI 42.05 kg/m   Patient alert and oriented and in no cardiopulmonary distress.  HEENT: No facial asymmetry, EOMI,   oropharynx pink and moist.  Neck supple no JVD, no mass.  Chest: Clear to auscultation bilaterally.  CVS: S1, S2 no murmurs, no S3.Regular rate.  ABD: Soft non tender.   Ext: No edema  MS: Adequate ROM spine, shoulders, hips and knees.  Skin: Intact, no ulcerations or rash noted.  Psych: Good eye contact, normal affect. Memory intact not anxious or depressed appearing.  CNS: CN 2-12 intact, power,  normal throughout.no focal deficits noted.   Assessment & Plan  Essential hypertension Controlled, no change in medication DASH diet and commitment to daily physical activity for a minimum of 30 minutes discussed and encouraged, as a part of  hypertension management. The importance of attaining a healthy weight is also discussed.  BP/Weight 12/30/2016 11/13/2016 10/05/2016 05/28/2016 05/25/2016 3/76/2831 11/18/7614  Systolic BP 073 710 626 948 546 270 350  Diastolic BP 84 79 80 74 68 82 64  Wt. (Lbs) 245 244 240.08 236.12 - 237.12 225  BMI 42.05 41.88 41.21 40.53 - 40.68 38.6       Dyslipidemia Hyperlipidemia:Low fat diet discussed and encouraged.   Lipid Panel  Lab Results  Component Value Date   CHOL 139 01/28/2016   HDL 43 (L) 01/28/2016   LDLCALC 79 01/28/2016   TRIG 86 01/28/2016   CHOLHDL 3.2 01/28/2016   Updated lab needed at/ before next visit. Needs to commit to regular exercise    CONGESTIVE HEART FAILURE Asymptomatic and stable  Asthma Controlled with very infrequent use of maintainacemedication  MORBID OBESITY Deteriorated. Patient re-educated about  the importance of commitment to a  minimum of 150 minutes of exercise per week.  The importance of healthy food choices with portion control discussed. Encouraged to start a food diary, count calories and to consider  joining a support group. Sample diet sheets offered. Goals set by the patient for the next several months.   Weight /BMI 12/30/2016 11/13/2016 10/05/2016  WEIGHT 245 lb 244 lb 240 lb 1.3 oz  HEIGHT 5\' 4"  - 5\' 4"   BMI 42.05 kg/m2 41.88 kg/m2 41.21 kg/m2      Seasonal allergies No current or recent flare , not using medication for last 3 months

## 2016-12-30 NOTE — Patient Instructions (Addendum)
Annual exam in 4.5 months, call if you need me before  Congrats on getting your colonoscopy , thankful it went well  Fasting lipid, chem 7 and eGFTR and vit D and cBC  Please work on good  health habits so that your health will improve. 1. Commitment to daily physical activity for 30 to 60  minutes, if you are able to do this.  2. Commitment to wise food choices. Aim for half of your  food intake to be vegetable and fruit, one quarter starchy foods, and one quarter protein. Try to eat on a regular schedule  3 meals per day, snacking between meals should be limited to vegetables or fruits or small portions of nuts. 64 ounces of water per day is generally recommended, unless you have specific health conditions, like heart failure or kidney failure where you will need to limit fluid intake.  3. Commitment to sufficient and a  good quality of physical and mental rest daily, generally between 6 to 8 hours per day.  WITH PERSISTANCE AND PERSEVERANCE, THE IMPOSSIBLE , BECOMES THE NORM! It is important that you exercise regularly at least 30 minutes 5 times a week. If you develop chest pain, have severe difficulty breathing, or feel very tired, stop exercising immediately and seek medical attention

## 2016-12-31 NOTE — Assessment & Plan Note (Signed)
No current or recent flare , not using medication for last 3 months

## 2016-12-31 NOTE — Assessment & Plan Note (Signed)
Controlled with very infrequent use of maintainacemedication

## 2016-12-31 NOTE — Assessment & Plan Note (Signed)
Controlled, no change in medication DASH diet and commitment to daily physical activity for a minimum of 30 minutes discussed and encouraged, as a part of hypertension management. The importance of attaining a healthy weight is also discussed.  BP/Weight 12/30/2016 11/13/2016 10/05/2016 05/28/2016 05/25/2016 2/57/5051 02/19/3581  Systolic BP 518 984 210 312 811 886 773  Diastolic BP 84 79 80 74 68 82 64  Wt. (Lbs) 245 244 240.08 236.12 - 237.12 225  BMI 42.05 41.88 41.21 40.53 - 40.68 38.6

## 2016-12-31 NOTE — Assessment & Plan Note (Signed)
Hyperlipidemia:Low fat diet discussed and encouraged.   Lipid Panel  Lab Results  Component Value Date   CHOL 139 01/28/2016   HDL 43 (L) 01/28/2016   LDLCALC 79 01/28/2016   TRIG 86 01/28/2016   CHOLHDL 3.2 01/28/2016   Updated lab needed at/ before next visit. Needs to commit to regular exercise

## 2016-12-31 NOTE — Assessment & Plan Note (Signed)
Deteriorated. Patient re-educated about  the importance of commitment to a  minimum of 150 minutes of exercise per week.  The importance of healthy food choices with portion control discussed. Encouraged to start a food diary, count calories and to consider  joining a support group. Sample diet sheets offered. Goals set by the patient for the next several months.   Weight /BMI 12/30/2016 11/13/2016 10/05/2016  WEIGHT 245 lb 244 lb 240 lb 1.3 oz  HEIGHT 5\' 4"  - 5\' 4"   BMI 42.05 kg/m2 41.88 kg/m2 41.21 kg/m2

## 2016-12-31 NOTE — Assessment & Plan Note (Signed)
Asymptomatic and stable 

## 2017-03-13 ENCOUNTER — Other Ambulatory Visit: Payer: Self-pay | Admitting: Family Medicine

## 2017-04-08 DIAGNOSIS — I10 Essential (primary) hypertension: Secondary | ICD-10-CM | POA: Diagnosis not present

## 2017-04-08 DIAGNOSIS — E785 Hyperlipidemia, unspecified: Secondary | ICD-10-CM | POA: Diagnosis not present

## 2017-05-19 ENCOUNTER — Encounter: Payer: Medicare Other | Admitting: Family Medicine

## 2017-06-01 ENCOUNTER — Telehealth: Payer: Self-pay | Admitting: Gastroenterology

## 2017-06-01 NOTE — Telephone Encounter (Signed)
Placed on SLF's keyboard

## 2017-06-01 NOTE — Telephone Encounter (Signed)
Obtain copy of OP note & PLACE ON MY CHAIR.

## 2017-06-01 NOTE — Telephone Encounter (Signed)
Recall for tcs at [facility]

## 2017-06-01 NOTE — Telephone Encounter (Signed)
Pt had TCS done at [facility] on 12/11/16.

## 2017-06-02 ENCOUNTER — Ambulatory Visit: Payer: Medicare Other | Admitting: Family Medicine

## 2017-06-02 NOTE — Telephone Encounter (Signed)
REVIEWED-NO ADDITIONAL RECOMMENDATIONS. 

## 2017-06-22 ENCOUNTER — Other Ambulatory Visit: Payer: Self-pay | Admitting: Family Medicine

## 2017-06-23 ENCOUNTER — Encounter: Payer: Self-pay | Admitting: Family Medicine

## 2017-08-18 ENCOUNTER — Encounter: Payer: Medicare Other | Admitting: Family Medicine

## 2017-08-24 DIAGNOSIS — E119 Type 2 diabetes mellitus without complications: Secondary | ICD-10-CM | POA: Diagnosis not present

## 2017-08-24 DIAGNOSIS — H524 Presbyopia: Secondary | ICD-10-CM | POA: Diagnosis not present

## 2017-09-27 ENCOUNTER — Other Ambulatory Visit: Payer: Self-pay | Admitting: Family Medicine

## 2017-10-04 ENCOUNTER — Ambulatory Visit: Payer: Medicare HMO

## 2017-10-05 ENCOUNTER — Ambulatory Visit: Payer: Medicare Other

## 2017-11-11 ENCOUNTER — Encounter: Payer: Self-pay | Admitting: Family Medicine

## 2017-11-11 ENCOUNTER — Ambulatory Visit (INDEPENDENT_AMBULATORY_CARE_PROVIDER_SITE_OTHER): Payer: Medicare HMO | Admitting: Family Medicine

## 2017-11-11 VITALS — BP 124/84 | HR 68 | Resp 16 | Ht 64.0 in | Wt 256.0 lb

## 2017-11-11 DIAGNOSIS — Z0000 Encounter for general adult medical examination without abnormal findings: Secondary | ICD-10-CM | POA: Diagnosis not present

## 2017-11-11 DIAGNOSIS — J302 Other seasonal allergic rhinitis: Secondary | ICD-10-CM

## 2017-11-11 DIAGNOSIS — Z23 Encounter for immunization: Secondary | ICD-10-CM

## 2017-11-11 DIAGNOSIS — Z1231 Encounter for screening mammogram for malignant neoplasm of breast: Secondary | ICD-10-CM

## 2017-11-11 DIAGNOSIS — E785 Hyperlipidemia, unspecified: Secondary | ICD-10-CM

## 2017-11-11 DIAGNOSIS — I10 Essential (primary) hypertension: Secondary | ICD-10-CM

## 2017-11-11 MED ORDER — MONTELUKAST SODIUM 10 MG PO TABS: 10.0000 mg | ORAL_TABLET | Freq: Every day | ORAL | 3 refills | Status: DC

## 2017-11-11 NOTE — Patient Instructions (Addendum)
Annual wellness with nurse late August please  F/u with MD in early November, call if you need me sooner  Prevnar today  Please schedule mammogram at checkout  Lipid, cmp and eGFr today please   Singulair one daily sent for allergies, this will not make you sleep  Please change food choice and only drinkl water, cut back on cake and candy to you need to lose weight  Start using silver sneakers so that you commit to regular exericse of 150 minutes or more weekly

## 2017-11-14 IMAGING — MG MM DIANOSTIC UNILATERAL R
2 series · 2 of 2 positions shown · non-contrast
Comparison: Previous exam(s).

CLINICAL DATA: Screening recall for right breast calcifications.
Patient has a personal history of left mastectomy for breast
carcinoma.

EXAM:
DIGITAL DIAGNOSTIC RIGHT MAMMOGRAM

[R ML]
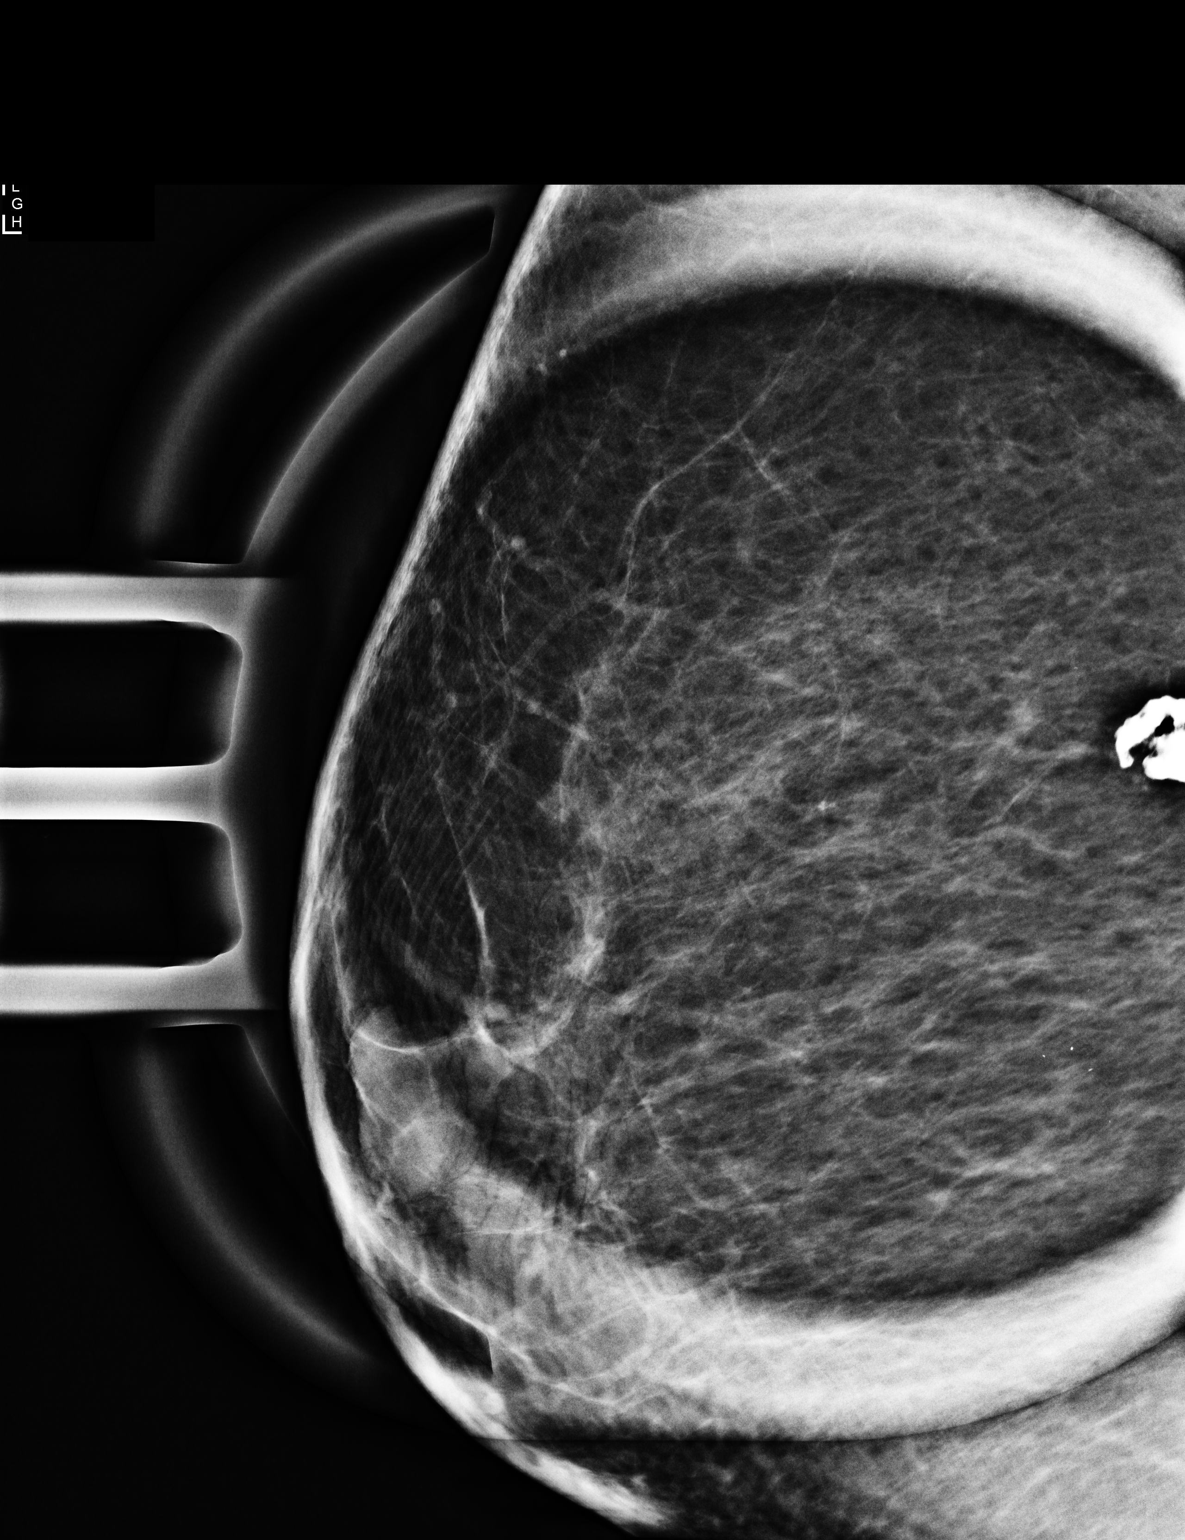

[R CC]
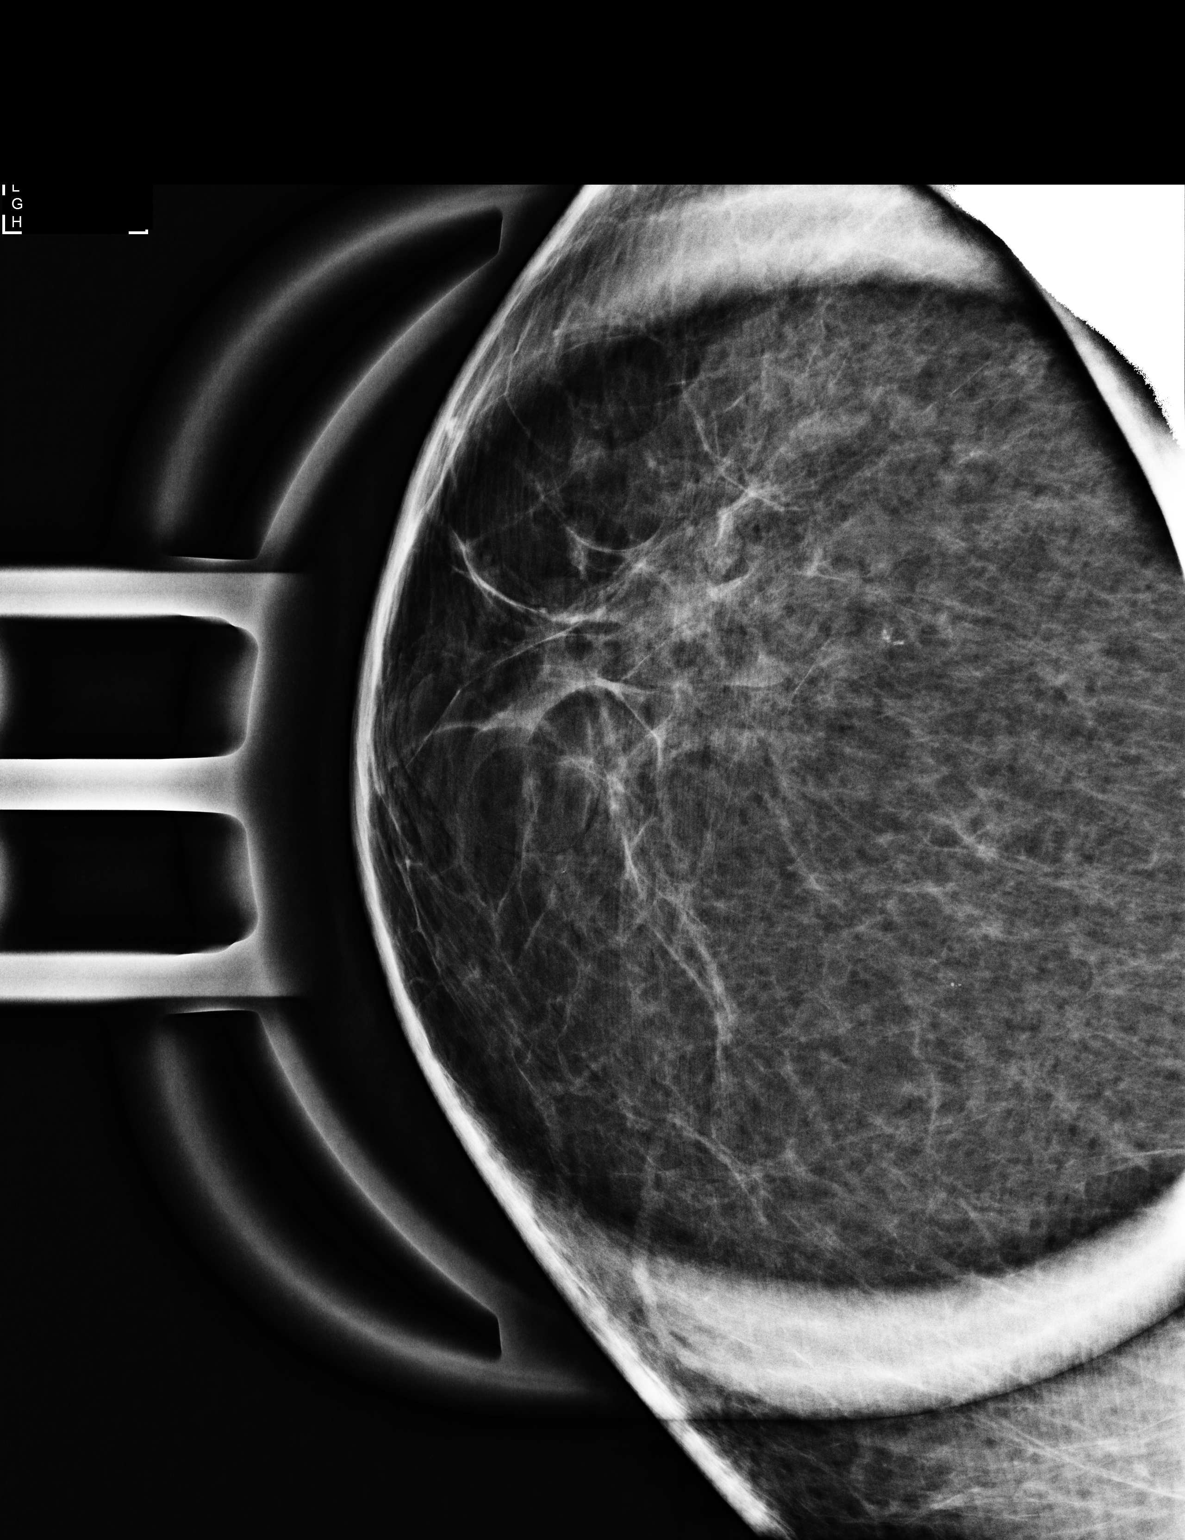

[2 of 2 positions shown; findings below may reference images not displayed]

ACR Breast Density Category b: There are scattered areas of
fibroglandular density.
FINDINGS: There is a small group of 4 or 5 small coarse calcifications that
have increased when compared to the 1974 exam. There is no
associated mass or distortion.
IMPRESSION: Probably benign right breast calcifications. Short-term follow-up
recommended.

RECOMMENDATION:
Right breast diagnostic mammography with magnification views in 6
months.

I have discussed the findings and recommendations with the patient.
Results were also provided in writing at the conclusion of the
visit. If applicable, a reminder letter will be sent to the patient
regarding the next appointment.

BI-RADS CATEGORY  3: Probably benign.

## 2017-11-15 ENCOUNTER — Encounter: Payer: Self-pay | Admitting: Family Medicine

## 2017-11-15 DIAGNOSIS — Z23 Encounter for immunization: Secondary | ICD-10-CM | POA: Insufficient documentation

## 2017-11-15 NOTE — Assessment & Plan Note (Signed)
Annual exam as documented. Counseling done  re healthy lifestyle involving commitment to 150 minutes exercise per week, heart healthy diet, and attaining healthy weight.The importance of adequate sleep also discussed. Changes in health habits are decided on by the patient with goals and time frames  set for achieving them. Immunization and cancer screening needs are specifically addressed at this visit. 

## 2017-11-15 NOTE — Assessment & Plan Note (Signed)
Deteriorated. Patient re-educated about  the importance of commitment to a  minimum of 150 minutes of exercise per week.  The importance of healthy food choices with portion control discussed. Encouraged to start a food diary, count calories and to consider  joining a support group. Sample diet sheets offered. Goals set by the patient for the next several months.   Weight /BMI 11/11/2017 12/30/2016 11/13/2016  WEIGHT 256 lb 245 lb 244 lb  HEIGHT 5\' 4"  5\' 4"  -  BMI 43.94 kg/m2 42.05 kg/m2 41.88 kg/m2

## 2017-11-15 NOTE — Progress Notes (Signed)
    Heather [name]     MRN: [id number]      DOB: January 17, 1941  HPI: Patient is in for annual physical exam. C/o uncontrolled allergy symptoms, excess watery nose and sneezing, no fever or chills , excess cough. Immunization is up to date PE: BP 124/84   Pulse 68   Resp 16   Ht 5\' 4"  (1.626 m)   Wt 256 lb (116.1 kg)   SpO2 94%   BMI 43.94 kg/m   Pleasant  female, alert and oriented x 3, in no cardio-pulmonary distress. Afebrile. HEENT No facial trauma or asymetry. Sinuses non tender.  Extra occullar muscles intact, pupils equally reactive to light. External ears normal, tympanic membranes clear. Oropharynx moist, no exudate.Edentulous Neck: supple, no adenopathy,JVD or thyromegaly.No bruits.  Chest: Clear to ascultation bilaterally.No crackles or wheezes. Non tender to palpation  Breast: Left mastectomy Right breast ,no masses or lumps. No tenderness. No nipple discharge or inversion. No axillary or supraclavicular adenopathy  Cardiovascular system; Heart sounds normal,  S1 and  S2 ,no S3.  No murmur, or thrill. Apical beat not displaced Peripheral pulses normal.  Abdomen: Soft, non tender, no organomegaly or masses. No bruits. Bowel sounds normal. No guarding, tenderness or rebound.  Rectal:  deferred , pt to  have 3 stool cards returned  GU: Not examined   Musculoskeletal exam: Decreased  ROM of spine, hips , shoulders and knees. No deformity ,swelling or crepitus noted. No muscle wasting or atrophy.   Neurologic: Cranial nerves 2 to 12 intact. Power, tone ,sensation and reflexes normal throughout. . No tremor.  Skin: Intact, no ulceration, erythema , scaling or rash noted. Pigmentation normal throughout  Psych; Normal mood and affect. Judgement and concentration normal   Assessment & Plan:  Annual physical exam Annual exam as documented. Counseling done  re healthy lifestyle involving commitment to 150 minutes exercise per week, heart healthy  diet, and attaining healthy weight.The importance of adequate sleep also discussed.  Changes in health habits are decided on by the patient with goals and time frames  set for achieving them. Immunization and cancer screening needs are specifically addressed at this visit.   Seasonal allergies Uncontrolled , pt to start daily medication for improved control  MORBID OBESITY Deteriorated. Patient re-educated about  the importance of commitment to a  minimum of 150 minutes of exercise per week.  The importance of healthy food choices with portion control discussed. Encouraged to start a food diary, count calories and to consider  joining a support group. Sample diet sheets offered. Goals set by the patient for the next several months.   Weight /BMI 11/11/2017 12/30/2016 11/13/2016  WEIGHT 256 lb 245 lb 244 lb  HEIGHT 5\' 4"  5\' 4"  -  BMI 43.94 kg/m2 42.05 kg/m2 41.88 kg/m2      Need for vaccination against Streptococcus pneumoniae using pneumococcal conjugate vaccine 13 After obtaining informed consent, the vaccine is  administered by LPN.

## 2017-11-15 NOTE — Assessment & Plan Note (Signed)
After obtaining informed consent, the vaccine is  administered by LPN.  

## 2017-11-15 NOTE — Assessment & Plan Note (Signed)
Uncontrolled , pt to start daily medication for improved control

## 2017-11-16 ENCOUNTER — Ambulatory Visit (HOSPITAL_COMMUNITY): Payer: Medicare Other

## 2017-11-16 ENCOUNTER — Telehealth: Payer: Self-pay

## 2017-11-16 NOTE — Telephone Encounter (Signed)
Left message stating she was returning your call but she didn't leave a call back number.

## 2017-11-16 NOTE — Telephone Encounter (Signed)
Her labs have been ordered. This was left on voicemail

## 2017-11-17 ENCOUNTER — Encounter (HOSPITAL_COMMUNITY): Payer: Self-pay | Admitting: Hematology

## 2017-11-17 ENCOUNTER — Other Ambulatory Visit: Payer: Self-pay | Admitting: Family Medicine

## 2017-11-17 ENCOUNTER — Other Ambulatory Visit: Payer: Self-pay

## 2017-11-17 ENCOUNTER — Inpatient Hospital Stay (HOSPITAL_COMMUNITY): Payer: Medicare HMO | Attending: Hematology | Admitting: Hematology

## 2017-11-17 VITALS — BP 137/70 | HR 71 | Temp 98.7°F | Resp 18 | Wt 259.0 lb

## 2017-11-17 DIAGNOSIS — C50912 Malignant neoplasm of unspecified site of left female breast: Secondary | ICD-10-CM

## 2017-11-17 DIAGNOSIS — Z853 Personal history of malignant neoplasm of breast: Secondary | ICD-10-CM | POA: Diagnosis not present

## 2017-11-17 DIAGNOSIS — Z9221 Personal history of antineoplastic chemotherapy: Secondary | ICD-10-CM

## 2017-11-17 DIAGNOSIS — Z9012 Acquired absence of left breast and nipple: Secondary | ICD-10-CM | POA: Diagnosis not present

## 2017-11-17 DIAGNOSIS — Z923 Personal history of irradiation: Secondary | ICD-10-CM | POA: Diagnosis not present

## 2017-11-17 DIAGNOSIS — Z1231 Encounter for screening mammogram for malignant neoplasm of breast: Secondary | ICD-10-CM

## 2017-11-17 NOTE — Assessment & Plan Note (Signed)
1.  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  Right breast has no palpable masses.  Her last right breast mammogram was in August 2017.  She missed her mammogram in 2018.  She is scheduled for mammogram next week.  She will also have blood work done.  We will call her if there is any abnormality.  Otherwise she will come back in 1 year for follow-up after mammograms and labs.

## 2017-11-17 NOTE — Progress Notes (Signed)
[facility] Patient Care Team: Fayrene Helper, MD as PCP - General Sheldon Silvan, Manon Hilding, PA-C as Physician Assistant (Oncology)  DIAGNOSIS:  Encounter Diagnoses  Name Primary?  . History of Invasive medullary carcinoma of left breast Yes  . Encounter for screening mammogram for breast cancer     SUMMARY OF ONCOLOGIC HISTORY: Oncology History   stage II B. 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 All therapy finished as of September 1998      History of Invasive medullary carcinoma of left breast   05/30/1996 Surgery    Modified radical mastectomy demonstrating a Stage IIB (T2N1M0) medullary carcinoma of left breast with 4/13 positive lymph nodes.  ER/PR negative.      08/07/1996 - 01/01/1997 Chemotherapy    CMFx 6 cycles      01/24/1997 - 04/16/1997 Radiation Therapy    Approximate start and completion dates.  A few breaks in radiation therapy due to desquamation       CHIEF COMPLIANT: Stage IIB medullary carcinoma of (L) breast; ER-/PR-   INTERVAL HISTORY: Heather [name] is a 77 y.o. female with remote h/o left breast cancer.    Here today with her daughter.   Her last unilateral (R) breast mammogram was done on 02/18/16 and showed stable, probably benign right breast calcifications. The radiologist recommended repeat mammogram 6 months from that time, but this was never done.   Denies any new pain or recent changes in her health.  She is scheduled for mammogram next week on 11/25/17. She has not noted any changes in her breast or chest wall.  Shares that she has a "little marble in my right breast that moves around and has been there for a long time." This is unchanged and stable per patient.    Oncologic family history:  -Sister: ovarian -Sister: lung -Brother:  throat -Father: prostate      REVIEW OF SYSTEMS:   Constitutional: Denies fevers, chills or abnormal weight loss Eyes: Denies blurriness of vision Ears, nose, mouth, throat, and face: Denies mucositis or sore throat Respiratory: Denies cough, dyspnea or wheezes Cardiovascular: Denies palpitation, chest discomfort Gastrointestinal:  Denies nausea, heartburn or change in bowel habits Skin: Denies abnormal skin rashes Lymphatics: Denies new lymphadenopathy or easy bruising Neurological:Denies numbness, tingling or new weaknesses Behavioral/Psych: Mood is stable, no new changes  Extremities: No lower extremity edema Breast: No palpable masses at left mastectomy site.  Right breast has no palpable mass.  All other systems were reviewed with the patient and are negative.  I have reviewed the past medical history, past surgical history, social history and family history with the patient and they are unchanged from previous note.  ALLERGIES:  is allergic to aspirin and penicillins.  MEDICATIONS:  Current Outpatient Medications  Medication Sig Dispense Refill  . cholecalciferol (VITAMIN D) 400 UNITS TABS tablet Take 400 Units by mouth daily.    . fluticasone furoate-vilanterol (BREO ELLIPTA) 100-25 MCG/INH AEPB Inhale 1 puff into the lungs daily as needed.    Marland Kitchen losartan-hydrochlorothiazide (HYZAAR) 100-25 MG tablet TAKE ONE (1) TABLET BY MOUTH EVERY DAY 90 tablet 0  . potassium chloride SA (K-DUR,KLOR-CON) 20 MEQ tablet One tablet once daily 30 tablet 5   No current facility-administered medications for this visit.     PHYSICAL EXAMINATION: ECOG PERFORMANCE STATUS: 1 -  Symptomatic but completely ambulatory  Vitals:   11/17/17 1343  BP: 137/70  Pulse: 71  Resp: 18  Temp: 98.7 F (37.1 C)  SpO2: 98%   Filed Weights   11/17/17 1343  Weight: 259 lb (117.5 kg)    GENERAL:alert, no distress and comfortable SKIN: skin color, texture, turgor are normal, no rashes or significant  lesions EYES: normal, Conjunctiva are pink and non-injected, sclera clear OROPHARYNX:no mucositis, no erythema and lips, buccal mucosa, and tongue normal  NECK: supple, thyroid normal size, non-tender, without nodularity LYMPH:  no palpable lymphadenopathy in the cervical, axillary or inguinal LUNGS: clear to auscultation and percussion with normal breathing effort HEART: regular rate & rhythm and no murmurs and no lower extremity edema ABDOMEN:abdomen soft, non-tender and normal bowel sounds MUSCULOSKELETAL:no cyanosis of digits and no clubbing   EXTREMITIES: No lower extremity edema BREAST: No palpable masses at the left mastectomy site.  No palpable right breast masses.  No axillary adenopathy. LABORATORY DATA:  I have reviewed the data as listed CMP Latest Ref Rng & Units 05/28/2016 01/28/2016 01/19/2016  Glucose 65 - 99 mg/dL 94 89 105(H)  BUN 7 - 25 mg/dL 13 17 14   Creatinine 0.60 - 0.93 mg/dL 0.99(H) 0.92 0.93  Sodium 135 - 146 mmol/L 139 139 137  Potassium 3.5 - 5.3 mmol/L 4.3 3.4(L) 3.3(L)  Chloride 98 - 110 mmol/L 102 99 99(L)  CO2 20 - 31 mmol/L 27 30 27   Calcium 8.6 - 10.4 mg/dL 9.2 8.9 8.7(L)  Total Protein 6.1 - 8.1 g/dL - 6.5 -  Total Bilirubin 0.2 - 1.2 mg/dL - 0.8 -  Alkaline Phos 33 - 130 U/L - 69 -  AST 10 - 35 U/L - 12 -  ALT 6 - 29 U/L - 7 -   No results found for: QQV956   Lab Results  Component Value Date   WBC 8.1 05/28/2016   HGB 12.2 05/28/2016   HCT 37.6 05/28/2016   MCV 92.8 05/28/2016   PLT 342 05/28/2016   NEUTROABS 6.0 04/12/2015    ASSESSMENT & PLAN:  History of Invasive medullary carcinoma of left breast 1.  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  Right breast has no palpable  masses.  Her last right breast mammogram was in August 2017.  She missed her mammogram in 2018.  She is scheduled for mammogram next week.  She will also have blood work done.  We will call her if there is any abnormality.  Otherwise she will come back in 1 year for follow-up after mammograms and labs.      Breast Cancer therapy associated bone loss: I have recommended calcium, Vitamin D and weight bearing exercises.   Orders Placed This Encounter  Procedures  . MM 3D SCREEN BREAST UNI RIGHT    Standing Status:   Future    Standing Expiration Date:   05/21/2019    Order Specific Question:   Reason for Exam (SYMPTOM  OR DIAGNOSIS REQUIRED)    Answer:   annual screening mammogram; h/o left breast cancer s/p mastectomy    Order Specific Question:   Preferred imaging location?    Answer:   [facility]  . CBC with Differential/Platelet    Standing Status:   Future  Standing Expiration Date:   11/17/2018  . Comprehensive metabolic panel    Standing Status:   Future    Standing Expiration Date:   11/17/2018    Order Specific Question:   Has the patient fasted?    Answer:   No   The patient has a good understanding of the overall plan. she agrees with it. she will call with any problems that may develop before the next visit here.   This note includes documentation from Mike Craze, NP, who was present during this patient's office visit and evaluation.  I have reviewed this note for its completeness and accuracy.  I have edited this note accordingly based on my findings and medical opinion.      Derek Jack, MD 11/17/17

## 2017-11-23 ENCOUNTER — Other Ambulatory Visit: Payer: Self-pay | Admitting: Family Medicine

## 2017-11-23 DIAGNOSIS — Z853 Personal history of malignant neoplasm of breast: Secondary | ICD-10-CM

## 2017-11-25 ENCOUNTER — Ambulatory Visit (HOSPITAL_COMMUNITY): Payer: Medicare HMO

## 2017-11-25 DIAGNOSIS — E785 Hyperlipidemia, unspecified: Secondary | ICD-10-CM | POA: Diagnosis not present

## 2017-11-25 DIAGNOSIS — I10 Essential (primary) hypertension: Secondary | ICD-10-CM | POA: Diagnosis not present

## 2017-11-25 LAB — COMPLETE METABOLIC PANEL WITH GFR
AG Ratio: 1.2 (calc) (ref 1.0–2.5)
ALT: 6 U/L (ref 6–29)
AST: 12 U/L (ref 10–35)
Albumin: 3.8 g/dL (ref 3.6–5.1)
Alkaline phosphatase (APISO): 84 U/L (ref 33–130)
BUN: 14 mg/dL (ref 7–25)
CO2: 31 mmol/L (ref 20–32)
Calcium: 9.3 mg/dL (ref 8.6–10.4)
Chloride: 102 mmol/L (ref 98–110)
Creat: 0.88 mg/dL (ref 0.60–0.93)
GFR, Est African American: 74 mL/min/{1.73_m2} (ref >=60)
GFR, Est Non African American: 64 mL/min/{1.73_m2} (ref >=60)
Globulin: 3.3 g/dL (calc) (ref 1.9–3.7)
Glucose, Bld: 95 mg/dL (ref 65–99)
Potassium: 4.1 mmol/L (ref 3.5–5.3)
Sodium: 139 mmol/L (ref 135–146)
Total Bilirubin: 0.5 mg/dL (ref 0.2–1.2)
Total Protein: 7.1 g/dL (ref 6.1–8.1)

## 2017-11-25 LAB — LIPID PANEL
Cholesterol: 185 mg/dL (ref <200)
HDL: 42 mg/dL — ABNORMAL LOW (ref >50)
LDL Cholesterol (Calc): 123 mg/dL (calc) — ABNORMAL HIGH
Non-HDL Cholesterol (Calc): 143 mg/dL (calc) — ABNORMAL HIGH (ref <130)
Total CHOL/HDL Ratio: 4.4 (calc) (ref <5.0)
Triglycerides: 92 mg/dL (ref <150)

## 2017-12-07 ENCOUNTER — Ambulatory Visit (HOSPITAL_COMMUNITY)
Admission: RE | Admit: 2017-12-07 | Discharge: 2017-12-07 | Disposition: A | Discharge status: Home / Self Care | Payer: Medicare HMO | Source: Ambulatory Visit | Attending: Adult Health | Admitting: Adult Health

## 2017-12-07 DIAGNOSIS — Z9012 Acquired absence of left breast and nipple: Secondary | ICD-10-CM | POA: Diagnosis not present

## 2017-12-07 DIAGNOSIS — Z853 Personal history of malignant neoplasm of breast: Secondary | ICD-10-CM | POA: Diagnosis not present

## 2017-12-07 DIAGNOSIS — Z1231 Encounter for screening mammogram for malignant neoplasm of breast: Secondary | ICD-10-CM | POA: Insufficient documentation

## 2017-12-07 DIAGNOSIS — R921 Mammographic calcification found on diagnostic imaging of breast: Secondary | ICD-10-CM | POA: Diagnosis not present

## 2018-01-04 ENCOUNTER — Other Ambulatory Visit: Payer: Self-pay | Admitting: Family Medicine

## 2018-02-07 ENCOUNTER — Ambulatory Visit: Payer: Medicare HMO

## 2018-02-17 ENCOUNTER — Ambulatory Visit (INDEPENDENT_AMBULATORY_CARE_PROVIDER_SITE_OTHER): Payer: Medicare HMO

## 2018-02-17 VITALS — BP 124/80 | HR 71 | Resp 16 | Ht 64.0 in | Wt 259.0 lb

## 2018-02-17 DIAGNOSIS — Z0000 Encounter for general adult medical examination without abnormal findings: Secondary | ICD-10-CM | POA: Diagnosis not present

## 2018-02-17 NOTE — Patient Instructions (Signed)
Heather [name] , Thank you for taking time to come for your Medicare Wellness Visit. I appreciate your ongoing commitment to your health goals. Please review the following plan we discussed and let me know if I can assist you in the future.    Bone density is due. Call [phone] to schedule   Next appt 06/09/2018 at 10:20    Screening recommendations/referrals: Colonoscopy: up to date Mammogram: up to date Bone Density: will refer  Recommended yearly ophthalmology/optometry visit for glaucoma screening and checkup Recommended yearly dental visit for hygiene and checkup  Vaccinations: Influenza vaccine: flu clinic on [address] after aug 16 Pneumococcal vaccine: up to date  Tdap vaccine: not done  Shingles vaccine: ask insurance if shingrix is covered     Advanced directives: done   Conditions/risks identified: done   Next appointment: 06/09/18 10:20 am   Preventive Care 43 Years and Older, Female Preventive care refers to lifestyle choices and visits with your health care provider that can promote health and wellness. What does preventive care include?  A yearly physical exam. This is also called an annual well check.  Dental exams once or twice a year.  Routine eye exams. Ask your health care provider how often you should have your eyes checked.  Personal lifestyle choices, including:  Daily care of your teeth and gums.  Regular physical activity.  Eating a healthy diet.  Avoiding tobacco and drug use.  Limiting alcohol use.  Practicing safe sex.  Taking low-dose aspirin every day.  Taking vitamin and mineral supplements as recommended by your health care provider. What happens during an annual well check? The services and screenings done by your health care provider during your annual well check will depend on your age, overall health, lifestyle risk factors, and family history of disease. Counseling  Your health care provider may ask you questions about  your:  Alcohol use.  Tobacco use.  Drug use.  Emotional well-being.  Home and relationship well-being.  Sexual activity.  Eating habits.  History of falls.  Memory and ability to understand (cognition).  Work and work Statistician.  Reproductive health. Screening  You may have the following tests or measurements:  Height, weight, and BMI.  Blood pressure.  Lipid and cholesterol levels. These may be checked every 5 years, or more frequently if you are over 91 years old.  Skin check.  Lung cancer screening. You may have this screening every year starting at age 30 if you have a 30-pack-year history of smoking and currently smoke or have quit within the past 15 years.  Fecal occult blood test (FOBT) of the stool. You may have this test every year starting at age 59.  Flexible sigmoidoscopy or colonoscopy. You may have a sigmoidoscopy every 5 years or a colonoscopy every 10 years starting at age 93.  Hepatitis C blood test.  Hepatitis B blood test.  Sexually transmitted disease (STD) testing.  Diabetes screening. This is done by checking your blood sugar (glucose) after you have not eaten for a while (fasting). You may have this done every 1-3 years.  Bone density scan. This is done to screen for osteoporosis. You may have this done starting at age 58.  Mammogram. This may be done every 1-2 years. Talk to your health care provider about how often you should have regular mammograms. Talk with your health care provider about your test results, treatment options, and if necessary, the need for more tests. Vaccines  Your health care provider may recommend certain  vaccines, such as:  Influenza vaccine. This is recommended every year.  Tetanus, diphtheria, and acellular pertussis (Tdap, Td) vaccine. You may need a Td booster every 10 years.  Zoster vaccine. You may need this after age 31.  Pneumococcal 13-valent conjugate (PCV13) vaccine. One dose is recommended  after age 42.  Pneumococcal polysaccharide (PPSV23) vaccine. One dose is recommended after age 57. Talk to your health care provider about which screenings and vaccines you need and how often you need them. This information is not intended to replace advice given to you by your health care provider. Make sure you discuss any questions you have with your health care provider. Document Released: 08/02/2015 Document Revised: 03/25/2016 Document Reviewed: 05/07/2015 Elsevier Interactive Patient Education  2017 [place] Prevention in the Home Falls can cause injuries. They can happen to people of all ages. There are many things you can do to make your home safe and to help prevent falls. What can I do on the outside of my home?  Regularly fix the edges of walkways and driveways and fix any cracks.  Remove anything that might make you trip as you walk through a door, such as a raised step or threshold.  Trim any bushes or trees on the path to your home.  Use bright outdoor lighting.  Clear any walking paths of anything that might make someone trip, such as rocks or tools.  Regularly check to see if handrails are loose or broken. Make sure that both sides of any steps have handrails.  Any raised decks and porches should have guardrails on the edges.  Have any leaves, snow, or ice cleared regularly.  Use sand or salt on walking paths during winter.  Clean up any spills in your garage right away. This includes oil or grease spills. What can I do in the bathroom?  Use night lights.  Install grab bars by the toilet and in the tub and shower. Do not use towel bars as grab bars.  Use non-skid mats or decals in the tub or shower.  If you need to sit down in the shower, use a plastic, non-slip stool.  Keep the floor dry. Clean up any water that spills on the floor as soon as it happens.  Remove soap buildup in the tub or shower regularly.  Attach bath mats securely with  double-sided non-slip rug tape.  Do not have throw rugs and other things on the floor that can make you trip. What can I do in the bedroom?  Use night lights.  Make sure that you have a light by your bed that is easy to reach.  Do not use any sheets or blankets that are too big for your bed. They should not hang down onto the floor.  Have a firm chair that has side arms. You can use this for support while you get dressed.  Do not have throw rugs and other things on the floor that can make you trip. What can I do in the kitchen?  Clean up any spills right away.  Avoid walking on wet floors.  Keep items that you use a lot in easy-to-reach places.  If you need to reach something above you, use a strong step stool that has a grab bar.  Keep electrical cords out of the way.  Do not use floor polish or wax that makes floors slippery. If you must use wax, use non-skid floor wax.  Do not have throw rugs and other things  on the floor that can make you trip. What can I do with my stairs?  Do not leave any items on the stairs.  Make sure that there are handrails on both sides of the stairs and use them. Fix handrails that are broken or loose. Make sure that handrails are as long as the stairways.  Check any carpeting to make sure that it is firmly attached to the stairs. Fix any carpet that is loose or worn.  Avoid having throw rugs at the top or bottom of the stairs. If you do have throw rugs, attach them to the floor with carpet tape.  Make sure that you have a light switch at the top of the stairs and the bottom of the stairs. If you do not have them, ask someone to add them for you. What else can I do to help prevent falls?  Wear shoes that:  Do not have high heels.  Have rubber bottoms.  Are comfortable and fit you well.  Are closed at the toe. Do not wear sandals.  If you use a stepladder:  Make sure that it is fully opened. Do not climb a closed stepladder.  Make  sure that both sides of the stepladder are locked into place.  Ask someone to hold it for you, if possible.  Clearly mark and make sure that you can see:  Any grab bars or handrails.  First and last steps.  Where the edge of each step is.  Use tools that help you move around (mobility aids) if they are needed. These include:  Canes.  Walkers.  Scooters.  Crutches.  Turn on the lights when you go into a dark area. Replace any light bulbs as soon as they burn out.  Set up your furniture so you have a clear path. Avoid moving your furniture around.  If any of your floors are uneven, fix them.  If there are any pets around you, be aware of where they are.  Review your medicines with your doctor. Some medicines can make you feel dizzy. This can increase your chance of falling. Ask your doctor what other things that you can do to help prevent falls. This information is not intended to replace advice given to you by your health care provider. Make sure you discuss any questions you have with your health care provider. Document Released: 05/02/2009 Document Revised: 12/12/2015 Document Reviewed: 08/10/2014 Elsevier Interactive Patient Education  2017 [employer].

## 2018-02-17 NOTE — Progress Notes (Signed)
Subjective:   Heather [name] is a 77 y.o. female who presents for Medicare Annual (Subsequent) preventive examination.  Review of Systems:   Cardiac Risk Factors include: advanced age (>48men, >40 women);dyslipidemia;obesity (BMI >30kg/m2);sedentary lifestyle     Objective:     Vitals: BP 124/80   Pulse 71   Resp 16   Ht 5\' 4"  (1.626 m)   Wt 259 lb (117.5 kg)   SpO2 97%   BMI 44.46 kg/m   Body mass index is 44.46 kg/m.  Advanced Directives 02/17/2018 11/17/2017 11/13/2016 10/05/2016 01/19/2016 11/12/2015 04/12/2015  Does Patient Have a Medical Advance Directive? Yes Yes Yes Yes No Yes Yes  Type of Advance Directive - [place];Living will Living will Living will - Living will -  Does patient want to make changes to medical advance directive? Yes (ED - Information included in AVS) No - Patient declined - No - Patient declined - - -  Copy of [place] in Chart? - No - copy requested - - - No - copy requested No - copy requested  Would patient like information on creating a medical advance directive? - - - Yes (MAU/Ambulatory/Procedural Areas - Information given) - - -    Tobacco Social History   Tobacco Use  Smoking Status Former Smoker  . Years: 40.00  . Types: Cigarettes  . Last attempt to quit: 07/19/1998  . Years since quitting: 19.5  Smokeless Tobacco Never Used     Counseling given: Not Answered   Clinical Intake:  Pre-visit preparation completed: Yes  Pain : No/denies pain Pain Score: 0-No pain     Nutritional Status: BMI > 30  Obese Diabetes: No  How often do you need to have someone help you when you read instructions, pamphlets, or other written materials from your doctor or pharmacy?: 1 - Never What is the last grade level you completed in school?: college   Interpreter Needed?: No  Information entered by :: Shemeka Wardle LPN   Past Medical History:  Diagnosis Date  . Asthma   . Breast cancer ([place]) 1998   left    . Congestive heart failure ([place])   . FH: CAD (coronary artery disease)   . GERD (gastroesophageal reflux disease)   . Heart failure   . Hypertension 1998  . 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 Personal history of malignant neoplasm of breast    Past Surgical History:  Procedure Laterality Date  . ABDOMINAL HYSTERECTOMY  1973   fibroids  . BREAST SURGERY Left 1998   breast ca  . FLEXIBLE SIGMOIDOSCOPY N/A 05/25/2016   Procedure: FLEXIBLE SIGMOIDOSCOPY;  Surgeon: Danie Binder, MD;  Location: [facility];  Service: Endoscopy;  Laterality: N/A;  2:00 AM  . MASTECTOMY  1997   left breast   . TOTAL ABDOMINAL HYSTERECTOMY W/ BILATERAL SALPINGOOPHORECTOMY  1971   unilateral   . 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 Parkinsonism Mother   . Prostate cancer Father   . Kidney disease Sister        nephrectomy  . Cancer Brother        throat  . Coronary artery disease Unknown        Family History of males   . Breast cancer Sister   . Lung cancer  Sister   . Breast cancer Sister   . Cervical cancer Sister    Social History   Socioeconomic History  . Marital status: Widowed    Spouse name: Not on file  . Number of children: 6  . Years of education: Not on file  . Highest education level: Some college, no degree  Occupational History  . Occupation: disabled in [place]  . Financial resource strain: Not hard at all  . Food insecurity:    Worry: Never true    Inability: Never true  . Transportation needs:    Medical: No    Non-medical: No  Tobacco Use  . Smoking status: Former Smoker    Years: 40.00    Types: Cigarettes    Last attempt to quit: 07/19/1998    Years since quitting: 19.5  . Smokeless tobacco: Never Used  Substance and Sexual Activity  . Alcohol use: No  . Drug use: No  . Sexual activity:  Not Currently  Lifestyle  . Physical activity:    Days per week: 0 days    Minutes per session: 0 min  . Stress: Not at all  Relationships  . Social connections:    Talks on phone: More than three times a week    Gets together: More than three times a week    Attends religious service: More than 4 times per year    Active member of club or organization: No    Attends meetings of clubs or organizations: Never    Relationship status: Divorced  Other Topics Concern  . Not on file  Social History Narrative  . Not on file    Outpatient Encounter Medications as of 02/17/2018  Medication Sig  . cholecalciferol (VITAMIN D) 400 UNITS TABS tablet Take 400 Units by mouth daily.  . fluticasone furoate-vilanterol (BREO ELLIPTA) 100-25 MCG/INH AEPB Inhale 1 puff into the lungs daily as needed.  Marland Kitchen losartan-hydrochlorothiazide (HYZAAR) 100-25 MG tablet TAKE ONE (1) TABLET BY MOUTH EVERY DAY  . potassium chloride SA (K-DUR,KLOR-CON) 20 MEQ tablet TAKE ONE TABLET BY MOUTH TWICE A DAY   No facility-administered encounter medications on file as of 02/17/2018.     Activities of Daily Living In your present state of health, do you have any difficulty performing the following activities: 02/17/2018  Hearing? N  Vision? N  Difficulty concentrating or making decisions? N  Walking or climbing stairs? Y  Dressing or bathing? N  Doing errands, shopping? N  Preparing Food and eating ? N  Using the Toilet? N  In the past six months, have you accidently leaked urine? N  Do you have problems with loss of bowel control? N  Managing your Medications? N  Managing your Finances? N  Housekeeping or managing your Housekeeping? N  Some recent data might be hidden    Patient Care Team: Fayrene Helper, MD as PCP - General [place], Manon Hilding, PA-C as Physician Assistant (Oncology)    Assessment:   This is a routine wellness examination for [facility].  Exercise Activities and Dietary recommendations Current  Exercise Habits: The patient does not participate in regular exercise at present, Exercise limited by: cardiac condition(s);respiratory conditions(s)  Goals    . Increase physical activity     Start walking 5 days a week for 30 minutes, 5 days a week        Fall Risk Fall Risk  02/17/2018 11/11/2017 11/11/2017 10/05/2016 05/09/2015  Falls in the past year? No No No No  Yes  Number falls in past yr: - - - - 1  Injury with Fall? - - - - No   Is the patient's home free of loose throw rugs in walkways, pet beds, electrical cords, etc?   yes      Grab bars in the bathroom? yes      Handrails on the stairs?   yes      Adequate lighting?   yes  Timed Get Up and Go performed:   Depression Screen PHQ 2/9 Scores 02/17/2018 11/11/2017 11/11/2017 10/05/2016  PHQ - 2 Score 0 0 0 0  PHQ- 9 Score - - - -     Cognitive Function     6CIT Screen 02/17/2018 02/17/2018 10/05/2016  What Year? 0 points 0 points 0 points  What month? 0 points 0 points 0 points  What time? 0 points 0 points 0 points  Count back from 20 0 points - 0 points  Months in reverse 0 points - 0 points  Repeat phrase 0 points - 0 points  Total Score 0 - 0    Immunization History  Administered Date(s) Administered  . Influenza Whole 05/06/2006, 05/25/2007, 05/06/2009, 03/27/2010  . Influenza,inj,Quad PF,6+ Mos 04/12/2013, 05/30/2014, 05/09/2015, 05/28/2016  . Pneumococcal Conjugate-13 11/11/2017  . Pneumococcal Polysaccharide-23 02/14/2007, 01/30/2016  . Td 02/14/2007    Qualifies for Shingles Vaccine? Will ask insurance if covered   Screening Tests Health Maintenance  Topic Date Due  . INFLUENZA VACCINE  02/17/2018  . TETANUS/TDAP  11/12/2018 (Originally 02/13/2017)  . DEXA SCAN  Completed  . PNA vac Low Risk Adult  Completed    Cancer Screenings: Lung: Low Dose CT Chest recommended if Age 11-80 years, 30 pack-year currently smoking OR have quit w/in 15years. Patient does not qualify. Breast:  Up to date on Mammogram?  Yes   Up to date of Bone Density/Dexa? Yes- will refer Colorectal: up to date   Additional Screenings:  Hepatitis C Screening:      Plan:      I have personally reviewed and noted the following in the patient's chart:   . Medical and social history . Use of alcohol, tobacco or illicit drugs  . Current medications and supplements . Functional ability and status . Nutritional status . Physical activity . Advanced directives . List of other physicians . Hospitalizations, surgeries, and ER visits in previous 12 months . Vitals . Screenings to include cognitive, depression, and falls . Referrals and appointments  In addition, I have reviewed and discussed with patient certain preventive protocols, quality metrics, and best practice recommendations. A written personalized care plan for preventive services as well as general preventive health recommendations were provided to patient.     Kate Sable, LPN, LPN  07/23/4313

## 2018-04-12 ENCOUNTER — Other Ambulatory Visit: Payer: Self-pay | Admitting: Family Medicine

## 2018-06-09 ENCOUNTER — Ambulatory Visit: Payer: Medicare HMO | Admitting: Family Medicine

## 2018-07-21 ENCOUNTER — Telehealth: Payer: Self-pay | Admitting: *Deleted

## 2018-07-21 NOTE — Telephone Encounter (Signed)
Heather [name] from [facility] called wanting to let Dr. Moshe Cipro know the losartan hctz was not available. Wanted to know if she wanted to split into two different drugs?

## 2018-07-21 NOTE — Telephone Encounter (Signed)
Let pharmacy know medication can be split into two different medications

## 2018-08-09 ENCOUNTER — Ambulatory Visit (INDEPENDENT_AMBULATORY_CARE_PROVIDER_SITE_OTHER): Payer: Medicare Other | Admitting: Family Medicine

## 2018-08-09 ENCOUNTER — Encounter: Payer: Self-pay | Admitting: Family Medicine

## 2018-08-09 VITALS — BP 128/80 | HR 98 | Resp 15 | Ht 64.0 in | Wt 254.0 lb

## 2018-08-09 DIAGNOSIS — Z23 Encounter for immunization: Secondary | ICD-10-CM | POA: Diagnosis not present

## 2018-08-09 DIAGNOSIS — E785 Hyperlipidemia, unspecified: Secondary | ICD-10-CM

## 2018-08-09 DIAGNOSIS — I10 Essential (primary) hypertension: Secondary | ICD-10-CM

## 2018-08-09 DIAGNOSIS — J4540 Moderate persistent asthma, uncomplicated: Secondary | ICD-10-CM | POA: Diagnosis not present

## 2018-08-09 LAB — LIPID PANEL
Cholesterol: 197 mg/dL (ref <200)
HDL: 39 mg/dL — ABNORMAL LOW (ref >50)
LDL Cholesterol (Calc): 136 mg/dL (calc) — ABNORMAL HIGH
Non-HDL Cholesterol (Calc): 158 mg/dL (calc) — ABNORMAL HIGH (ref <130)
Total CHOL/HDL Ratio: 5.1 (calc) — ABNORMAL HIGH (ref <5.0)
Triglycerides: 114 mg/dL (ref <150)

## 2018-08-09 LAB — TSH: TSH: 4.37 mIU/L (ref 0.40–4.50)

## 2018-08-09 LAB — COMPLETE METABOLIC PANEL WITH GFR
AG Ratio: 1.1 (calc) (ref 1.0–2.5)
ALT: 6 U/L (ref 6–29)
AST: 12 U/L (ref 10–35)
Albumin: 3.9 g/dL (ref 3.6–5.1)
Alkaline phosphatase (APISO): 77 U/L (ref 33–130)
BUN/Creatinine Ratio: 17 (calc) (ref 6–22)
BUN: 17 mg/dL (ref 7–25)
CO2: 31 mmol/L (ref 20–32)
Calcium: 9.4 mg/dL (ref 8.6–10.4)
Chloride: 99 mmol/L (ref 98–110)
Creat: 1.03 mg/dL — ABNORMAL HIGH (ref 0.60–0.93)
GFR, Est African American: 61 mL/min/{1.73_m2} (ref >=60)
GFR, Est Non African American: 52 mL/min/{1.73_m2} — ABNORMAL LOW (ref >=60)
Globulin: 3.6 g/dL (calc) (ref 1.9–3.7)
Glucose, Bld: 93 mg/dL (ref 65–139)
Potassium: 4.3 mmol/L (ref 3.5–5.3)
Sodium: 139 mmol/L (ref 135–146)
Total Bilirubin: 0.5 mg/dL (ref 0.2–1.2)
Total Protein: 7.5 g/dL (ref 6.1–8.1)

## 2018-08-09 LAB — CBC
HCT: 37.7 % (ref 35.0–45.0)
Hemoglobin: 12.4 g/dL (ref 11.7–15.5)
MCH: 29.5 pg (ref 27.0–33.0)
MCHC: 32.9 g/dL (ref 32.0–36.0)
MCV: 89.8 fL (ref 80.0–100.0)
MPV: 10.4 fL (ref 7.5–12.5)
Platelets: 393 10*3/uL (ref 140–400)
RBC: 4.2 10*6/uL (ref 3.80–5.10)
RDW: 12.3 % (ref 11.0–15.0)
WBC: 9.6 10*3/uL (ref 3.8–10.8)

## 2018-08-09 MED ORDER — HYDROCHLOROTHIAZIDE 25 MG PO TABS
25.0000 mg | ORAL_TABLET | Freq: Every day | ORAL | 3 refills | Status: DC
Start: 2018-08-09 — End: 2018-10-27

## 2018-08-09 MED ORDER — LOSARTAN POTASSIUM 100 MG PO TABS: 100.0000 mg | ORAL_TABLET | Freq: Every day | ORAL | 3 refills | Status: DC

## 2018-08-09 NOTE — Patient Instructions (Addendum)
Physical exam with MD in May, call if you need me before  Wellness with nurse August 2 or shortly after  Work on [employer] choice, drink only water, sto-p eating by 7 pm  Eat regularly 3 meals per day Snack fruit and vegetable only, stop processed foods  Weight loss goal of 10 to 12 pounds, you are given a 1500 cal diet sheet  Lipid, cmp and EGFr , CBC and TSH today  It is important that you exercise regularly at least 30 minutes 5 times a week. If you develop chest pain, have severe difficulty breathing, or feel very tired, stop exercising immediately and seek medical attention  Thank you  for choosing [facility]. We consider it a privelige to serve you.  Delivering excellent health care in a caring and  compassionate way is our goal.  Partnering with you,  so that together we can achieve this goal is our strategy.

## 2018-08-09 NOTE — Progress Notes (Signed)
   MARVETTE SCHAMP     MRN: [id number]      DOB: [birth year]   HPI Heather [name] is here for follow up and re-evaluation of chronic medical conditions, medication management and review of any available recent lab and radiology data.  Preventive health is updated, specifically  Cancer screening and Immunization.   Questions or concerns regarding consultations or procedures which the PT has had in the interim are  addressed. The PT denies any adverse reactions to current medications since the last visit.  There are no new concerns.  There are no specific complaints   ROS Denies recent fever or chills. Denies sinus pressure, nasal congestion, ear pain or sore throat. Denies chest congestion, productive cough or wheezing. Denies chest pains, palpitations and leg swelling Denies abdominal pain, nausea, vomiting,diarrhea or constipation.   Denies dysuria, frequency, hesitancy or incontinence. Denies joint pain, swelling and limitation in mobility. Denies headaches, seizures, numbness, or tingling. Denies depression, anxiety or insomnia. Denies skin break down or rash.   PE  BP 128/80   Pulse 98   Resp 15   Ht 5\' 4"  (1.626 m)   Wt 254 lb (115.2 kg)   SpO2 95%   BMI 43.60 kg/m   Patient alert and oriented and in no cardiopulmonary distress.  HEENT: No facial asymmetry, EOMI,   oropharynx pink and moist.  Neck supple no JVD, no mass.  Chest: Clear to auscultation bilaterally.  CVS: S1, S2 no murmurs, no S3.Regular rate.  ABD: Soft non tender.   Ext: No edema  MS: Adequate ROM spine, shoulders, hips and knees.  Skin: Intact, no ulcerations or rash noted.  Psych: Good eye contact, normal affect. Memory intact not anxious or depressed appearing.  CNS: CN 2-12 intact, power,  normal throughout.no focal deficits noted.   Assessment & Plan  Essential hypertension Controlled, no change in medication DASH diet and commitment to daily physical activity for a minimum of 30  minutes discussed and encouraged, as a part of hypertension management. The importance of attaining a healthy weight is also discussed.  BP/Weight 08/09/2018 02/17/2018 11/17/2017 11/11/2017 12/30/2016 11/13/2016 3/42/8768  Systolic BP 115 726 203 559 741 638 453  Diastolic BP 80 80 70 84 84 79 80  Wt. (Lbs) 254 259 259 256 245 244 240.08  BMI 43.6 44.46 44.46 43.94 42.05 41.88 41.21       MORBID OBESITY Improved, she is encouraged to continue this and applauded. Patient re-educated about  the importance of commitment to a  minimum of 150 minutes of exercise per week.  The importance of healthy food choices with portion control discussed. Encouraged to start a food diary, count calories and to consider  joining a support group. Sample diet sheets offered. Goals set by the patient for the next several months.   Weight /BMI 08/09/2018 02/17/2018 11/17/2017  WEIGHT 254 lb 259 lb 259 lb  HEIGHT 5\' 4"  5\' 4"  -  BMI 43.6 kg/m2 44.46 kg/m2 44.46 kg/m2      Need for immunization against influenza After obtaining informed consent, the vaccine is  administered with no adverse effect.   Asthma Controlled, no change in medication

## 2018-08-10 ENCOUNTER — Encounter: Payer: Self-pay | Admitting: Family Medicine

## 2018-08-14 NOTE — Assessment & Plan Note (Signed)
Improved, she is encouraged to continue this and applauded. Patient re-educated about  the importance of commitment to a  minimum of 150 minutes of exercise per week.  The importance of healthy food choices with portion control discussed. Encouraged to start a food diary, count calories and to consider  joining a support group. Sample diet sheets offered. Goals set by the patient for the next several months.   Weight /BMI 08/09/2018 02/17/2018 11/17/2017  WEIGHT 254 lb 259 lb 259 lb  HEIGHT 5\' 4"  5\' 4"  -  BMI 43.6 kg/m2 44.46 kg/m2 44.46 kg/m2

## 2018-08-14 NOTE — Assessment & Plan Note (Signed)
Controlled, no change in medication  

## 2018-08-14 NOTE — Assessment & Plan Note (Signed)
After obtaining informed consent, the vaccine is  administered with no adverse effect.

## 2018-08-14 NOTE — Assessment & Plan Note (Signed)
Controlled, no change in medication DASH diet and commitment to daily physical activity for a minimum of 30 minutes discussed and encouraged, as a part of hypertension management. The importance of attaining a healthy weight is also discussed.  BP/Weight 08/09/2018 02/17/2018 11/17/2017 11/11/2017 12/30/2016 11/13/2016 1/61/0960  Systolic BP 454 098 119 147 829 562 130  Diastolic BP 80 80 70 84 84 79 80  Wt. (Lbs) 254 259 259 256 245 244 240.08  BMI 43.6 44.46 44.46 43.94 42.05 41.88 41.21

## 2018-10-26 ENCOUNTER — Other Ambulatory Visit: Payer: Self-pay | Admitting: Family Medicine

## 2018-10-27 ENCOUNTER — Telehealth: Payer: Self-pay | Admitting: *Deleted

## 2018-10-27 ENCOUNTER — Other Ambulatory Visit: Payer: Self-pay

## 2018-10-27 MED ORDER — LOSARTAN POTASSIUM-HCTZ 100-25 MG PO TABS: ORAL_TABLET | ORAL | 1 refills | Status: DC

## 2018-10-27 NOTE — Telephone Encounter (Signed)
Combo pill now available again. Switched back to save on copay

## 2018-10-27 NOTE — Telephone Encounter (Signed)
[facility] called said they needed clarification on the generic losartin hctz 100-25 can be reached at [phone]

## 2018-11-02 ENCOUNTER — Telehealth: Payer: Self-pay | Admitting: *Deleted

## 2018-11-02 ENCOUNTER — Other Ambulatory Visit: Payer: Self-pay

## 2018-11-02 NOTE — Telephone Encounter (Signed)
Called pt to let her know she said why is she taking a potassium outside of the losartan. She doesn't know what to do as she feels like she is taking to much wanted to know what was recommended

## 2018-11-02 NOTE — Telephone Encounter (Signed)
Called pt to notify her she got really loud and said WE DID NOT UNDERSTAND THAT IT MAKES HER HEART RACE AND SHE WAS NOT TAKING IT. I told her I would pass the message along but she may need an appt to discuss her medication changes. I told her I would call her back if she needed an appt

## 2018-11-02 NOTE — Telephone Encounter (Signed)
Pt went to pick up her meds. She went by [facility] they did not have it so they sent her to [facility]. When she went the potassium pills were different. She has always had two different pills. She said it had potassium losartan hydroxyzide in one pill. She will not take this anymore until it is cleared up because it has made her heart race. She would like to speak with someone to clarify why her medicine was changed

## 2018-11-02 NOTE — Telephone Encounter (Signed)
Needs appt (dr s) scheduled since it is unclear what she thinks is making her heart race and needs provider to address her concerns.

## 2018-11-02 NOTE — Telephone Encounter (Signed)
Ok so the pt was originally started on Production assistant, radio but soon after it was on backorder so we split it into 2 separate pills. Losartan potassium and HCTZ. Well it recently came back in stock so the pharmacist called and asked if it could be changed back and I said yes. Now the pt is confused and wants to know if its ok to take this AND the separate potassium pill together. From what I can see she has been on the potassium for awhile.

## 2018-11-02 NOTE — Telephone Encounter (Signed)
She was originally prescribed losartan HCTZ combo like she picked up today but it was changed to the 2 separate pills due to the combo pill being on national backorder. Once it came back in stock the pharmacies contacted the office to change it back since that was what was originally prescribed, It is the same as the 2 separate pills. BUT, if she wants it changed back to the 2 separate pills, let me know.

## 2018-11-02 NOTE — Telephone Encounter (Signed)
Called patient and left message for them to return call at the office   

## 2018-11-07 ENCOUNTER — Ambulatory Visit (INDEPENDENT_AMBULATORY_CARE_PROVIDER_SITE_OTHER): Payer: Medicare Other | Admitting: Family Medicine

## 2018-11-07 ENCOUNTER — Encounter: Payer: Self-pay | Admitting: Family Medicine

## 2018-11-07 ENCOUNTER — Other Ambulatory Visit: Payer: Self-pay

## 2018-11-07 VITALS — BP 150/89 | Ht 64.0 in | Wt 240.0 lb

## 2018-11-07 DIAGNOSIS — I10 Essential (primary) hypertension: Secondary | ICD-10-CM | POA: Diagnosis not present

## 2018-11-07 DIAGNOSIS — E785 Hyperlipidemia, unspecified: Secondary | ICD-10-CM

## 2018-11-07 DIAGNOSIS — E559 Vitamin D deficiency, unspecified: Secondary | ICD-10-CM

## 2018-11-07 DIAGNOSIS — J302 Other seasonal allergic rhinitis: Secondary | ICD-10-CM

## 2018-11-07 MED ORDER — ALBUTEROL SULFATE HFA 108 (90 BASE) MCG/ACT IN AERS: 2.0000 | INHALATION_SPRAY | Freq: Four times a day (QID) | RESPIRATORY_TRACT | 1 refills | Status: DC | PRN

## 2018-11-07 MED ORDER — MONTELUKAST SODIUM 10 MG PO TABS: 10.0000 mg | ORAL_TABLET | Freq: Every day | ORAL | 3 refills | Status: DC

## 2018-11-07 MED ORDER — POTASSIUM CHLORIDE CRYS ER 20 MEQ PO TBCR: EXTENDED_RELEASE_TABLET | ORAL | 2 refills | Status: DC

## 2018-11-07 MED ORDER — LOSARTAN POTASSIUM 100 MG PO TABS: 100.0000 mg | ORAL_TABLET | Freq: Every day | ORAL | 3 refills | Status: DC

## 2018-11-07 MED ORDER — HYDROCHLOROTHIAZIDE 12.5 MG PO CAPS: 12.5000 mg | ORAL_CAPSULE | Freq: Every day | ORAL | 1 refills | Status: DC

## 2018-11-07 NOTE — Progress Notes (Signed)
Virtual Visit via Telephone Note  I connected with MAYO OWCZARZAK on 11/07/18 at 10:20 AM EDT by telephone and verified that I am speaking with the correct person using two identifiers.   I discussed the limitations, risks, security and privacy concerns of performing an evaluation and management service by telephone and the availability of in person appointments. I also discussed with the patient that there may be a patient responsible charge related to this service. The patient expressed understanding and agreed to proceed.  I am in the office and the patient is in her and daughter's home [place] last week Saturday  received BP medication from [facility] and after starting the medication last week Tuesday heart started racing so she has not taken anymore, ( her regular pharmacy [facility] told her they did not have it)   History of Present Illness: See HPI Review of Systems  Constitutional: Negative for chills and fever.  HENT: Positive for congestion. Negative for sinus pain and sore throat.   Cardiovascular: Positive for palpitations. Negative for chest pain, orthopnea, leg swelling and PND.       Relates it to her "new BP medication"  Gastrointestinal: Negative for abdominal pain.  Genitourinary: Negative for dysuria and frequency.  Neurological: Negative for headaches.  Psychiatric/Behavioral: Negative for depression. The patient is not nervous/anxious.       Observations/Objective: BP (!) 150/89   Ht 5\' 4"  (1.626 m)   Wt 240 lb (108.9 kg)   BMI 41.20 kg/m    Assessment and Plan:  Essential hypertension Uncontrolled with reported adverse s/e from current medication. New script sent in with close f/u in office arranged DASH diet and commitment to daily physical activity for a minimum of 30 minutes discussed and encouraged, as a part of hypertension management. The importance of attaining a healthy weight is also discussed.  BP/Weight 11/07/2018 08/09/2018 02/17/2018 11/17/2017  11/11/2017 12/30/2016 0/34/7425  Systolic BP 956 387 564 332 951 884 166  Diastolic BP 89 80 80 70 84 84 79  Wt. (Lbs) 240 254 259 259 256 245 244  BMI 41.2 43.6 44.46 44.46 43.94 42.05 41.88       Dyslipidemia Hyperlipidemia:Low fat diet discussed and encouraged.   Lipid Panel  Lab Results  Component Value Date   CHOL 197 08/09/2018   HDL 39 (L) 08/09/2018   LDLCALC 136 (H) 08/09/2018   TRIG 114 08/09/2018   CHOLHDL 5.1 (H) 08/09/2018   Uncontrolled , needs to reduce fried and fatty food intake    MORBID OBESITY   Patient re-educated about  the importance of commitment to a  minimum of 150 minutes of exercise per week as able.  The importance of healthy food choices with portion control discussed, as well as eating regularly and within a 12 hour window most days. The need to choose "clean , green" food 50 to 75% of the time is discussed, as well as to make water the primary drink and set a goal of 64 ounces water daily.    Weight /BMI 11/07/2018 08/09/2018 02/17/2018  WEIGHT 240 lb 254 lb 259 lb  HEIGHT 5\' 4"  5\' 4"  5\' 4"   BMI 41.2 kg/m2 43.6 kg/m2 44.46 kg/m2      Seasonal allergies Increased symptoms with season change, medications to be taken daily   Follow Up Instructions:    I discussed the assessment and treatment plan with the patient. The patient was provided an opportunity to ask questions and all were answered. The patient agreed with the plan  and demonstrated an understanding of the instructions.   The patient was advised to call back or seek an in-person evaluation if the symptoms worsen or if the condition fails to improve as anticipated.  I provided 22 minutes of non-face-to-face time during this encounter.   [name], MD

## 2018-11-07 NOTE — Patient Instructions (Addendum)
Keep physical exam in May, with BP check, call if you need me sooner  Medications ar sent  to your pharmacy, cozaar 100 mg, and HCTZ 12.5 mg, also montelukast and albuterol inhaler  Weight goal of 240 pounds or less   Non fasting chem 7 and EGFr and vit D level to be drawn the same day of your May visit  It is important that you exercise regularly at least 30 minutes 5 times a week. If you develop chest pain, have severe difficulty breathing, or feel very tired, stop exercising immediately and seek medical attention    Think about what you will eat, plan ahead. Choose " clean, green, fresh or frozen" over canned, processed or packaged foods which are more sugary, salty and fatty. 70 to 75% of food eaten should be vegetables and fruit. Three meals at set times with snacks allowed between meals, but they must be fruit or vegetables. Aim to eat over a 12 hour period , example 7 am to 7 pm, and STOP after  your last meal of the day. Drink water,generally about 64 ounces per day, no other drink is as healthy. Fruit juice is best enjoyed in a healthy way, by EATING the fruit.  Social distancing. Frequent hand washing with soap and water Keeping your hands off of your face.Wear a mask These 3 practices will help to keep both you and your community healthy during this time. Please practice them faithfully!

## 2018-11-13 NOTE — Assessment & Plan Note (Signed)
Uncontrolled with reported adverse s/e from current medication. New script sent in with close f/u in office arranged DASH diet and commitment to daily physical activity for a minimum of 30 minutes discussed and encouraged, as a part of hypertension management. The importance of attaining a healthy weight is also discussed.  BP/Weight 11/07/2018 08/09/2018 02/17/2018 11/17/2017 11/11/2017 12/30/2016 4/46/9507  Systolic BP 225 750 518 335 825 189 842  Diastolic BP 89 80 80 70 84 84 79  Wt. (Lbs) 240 254 259 259 256 245 244  BMI 41.2 43.6 44.46 44.46 43.94 42.05 41.88

## 2018-11-13 NOTE — Assessment & Plan Note (Signed)
   Patient re-educated about  the importance of commitment to a  minimum of 150 minutes of exercise per week as able.  The importance of healthy food choices with portion control discussed, as well as eating regularly and within a 12 hour window most days. The need to choose "clean , green" food 50 to 75% of the time is discussed, as well as to make water the primary drink and set a goal of 64 ounces water daily.    Weight /BMI 11/07/2018 08/09/2018 02/17/2018  WEIGHT 240 lb 254 lb 259 lb  HEIGHT 5\' 4"  5\' 4"  5\' 4"   BMI 41.2 kg/m2 43.6 kg/m2 44.46 kg/m2

## 2018-11-13 NOTE — Assessment & Plan Note (Signed)
Increased symptoms with season change, medications to be taken daily

## 2018-11-13 NOTE — Assessment & Plan Note (Signed)
Hyperlipidemia:Low fat diet discussed and encouraged.   Lipid Panel  Lab Results  Component Value Date   CHOL 197 08/09/2018   HDL 39 (L) 08/09/2018   LDLCALC 136 (H) 08/09/2018   TRIG 114 08/09/2018   CHOLHDL 5.1 (H) 08/09/2018   Uncontrolled , needs to reduce fried and fatty food intake

## 2018-11-25 ENCOUNTER — Other Ambulatory Visit (HOSPITAL_COMMUNITY): Payer: Self-pay | Admitting: *Deleted

## 2018-11-25 DIAGNOSIS — C50912 Malignant neoplasm of unspecified site of left female breast: Secondary | ICD-10-CM

## 2018-11-28 ENCOUNTER — Other Ambulatory Visit (HOSPITAL_COMMUNITY): Payer: Medicare HMO

## 2018-11-29 ENCOUNTER — Encounter: Payer: Medicare Other | Admitting: Family Medicine

## 2018-11-30 ENCOUNTER — Ambulatory Visit (INDEPENDENT_AMBULATORY_CARE_PROVIDER_SITE_OTHER): Payer: Medicare Other | Admitting: Family Medicine

## 2018-11-30 ENCOUNTER — Other Ambulatory Visit: Payer: Self-pay

## 2018-11-30 ENCOUNTER — Encounter: Payer: Self-pay | Admitting: Family Medicine

## 2018-11-30 VITALS — BP 138/82 | HR 64 | Resp 15 | Ht 64.0 in | Wt 247.1 lb

## 2018-11-30 DIAGNOSIS — Z1231 Encounter for screening mammogram for malignant neoplasm of breast: Secondary | ICD-10-CM

## 2018-11-30 DIAGNOSIS — Z0000 Encounter for general adult medical examination without abnormal findings: Secondary | ICD-10-CM | POA: Diagnosis not present

## 2018-11-30 NOTE — Progress Notes (Signed)
    Heather [name]     MRN: [id number]      DOB: 20-May-1941  HPI: Patient is in for annual physical exam. No other health concerns are expressed or addressed at the visit.    PE: BP 138/82   Pulse 64   Resp 15   Ht 5\' 4"  (1.626 m)   Wt 247 lb 1.9 oz (112.1 kg)   SpO2 97%   BMI 42.42 kg/m   Pleasant  female, alert and oriented x 3, in no cardio-pulmonary distress. Afebrile. HEENT No facial trauma or asymetry. Sinuses non tender.  Extra occullar muscles intact, pupils equally reactive to light. External ears normal, tympanic membranes clear. Oropharynx moist, no exudate. Neck: supple, no adenopathy,JVD or thyromegaly.No bruits.  Chest: Clear to ascultation bilaterally.No crackles or wheezes. Non tender to palpation  Breast: Left mastectomy. Right breast no mass No nipple discharge or inversion. No axillary or supraclavicular adenopathy  Cardiovascular system; Heart sounds normal,  S1 and  S2 ,no S3.  No murmur, or thrill. Apical beat not displaced Peripheral pulses normal.  Abdomen: Soft, non tender, no organomegaly or masses. No bruits. Bowel sounds normal. No guarding, tenderness or rebound.  GU: Asymptomatic, s/p hysterectomy, no exam indicated , no pelvic exam performed   Musculoskeletal exam: Decreased though adequate  ROM of spine, hips , shoulders and knees. deformity ,swelling and  crepitus noted in knees No muscle wasting or atrophy.   Neurologic: Cranial nerves 2 to 12 intact. Power, tone ,sensation and reflexes normal throughout. No disturbance in gait. No tremor.  Skin: Intact, no ulceration, erythema , scaling or rash noted. Pigmentation normal throughout  Psych; Normal [name] and affect. Judgement and concentration normal   Assessment & Plan:  Annual physical exam Annual exam as documented. Counseling done  re healthy lifestyle involving commitment to 150 minutes exercise per week, heart healthy diet, and attaining healthy weight.The  importance of adequate sleep also discussed. Immunization and cancer screening needs are specifically addressed at this visit.   MORBID OBESITY Improved   Patient re-educated about  the importance of commitment to a  minimum of 150 minutes of exercise per week as able.  The importance of healthy food choices with portion control discussed, as well as eating regularly and within a 12 hour window most days. The need to choose "clean , green" food 50 to 75% of the time is discussed, as well as to make water the primary drink and set a goal of 64 ounces water daily.  Encouraged to start a food diary,  and to consider  joining a support group. Sample diet sheets offered. Goals set by the patient for the next several months.   Weight /BMI 11/30/2018 11/07/2018 08/09/2018  WEIGHT 247 lb 1.9 oz 240 lb 254 lb  HEIGHT 5\' 4"  5\' 4"  5\' 4"   BMI 42.42 kg/m2 41.2 kg/m2 43.6 kg/m2

## 2018-11-30 NOTE — Patient Instructions (Addendum)
F/U in 5.5 months, call if you need me before  Mammgram is being scheduled when Due ( May 22 or after)  Congrats on weight loss and more regular exercise, you have lost 7  Pounds  Since last visit  Think about what you will eat, plan ahead. Choose " clean, green, fresh or frozen" over canned, processed or packaged foods which are more sugary, salty and fatty. 70 to 75% of food eaten should be vegetables and fruit. Three meals at set times with snacks allowed between meals, but they must be fruit or vegetables. Aim to eat over a 12 hour period , example 7 am to 7 pm, and STOP after  your last meal of the day. Drink water,generally about 64 ounces per day, no other drink is as healthy. Fruit juice is best enjoyed in a healthy way, by EATING the fruit.   Weight loss goal of 10 pounds   Social distancing. Frequent hand washing with soap and water Keeping your hands off of your face.Wear a mask and stay 6 ft away from people you do not live with These 3 practices will help to keep both you and your community healthy during this time. Please practice them faithfully!    It is important that you exercise regularly at least 30 minutes 5 times a week. If you develop chest pain, have severe difficulty breathing, or feel very tired, stop exercising immediately and seek medical attention    Thanks for choosing [facility], we consider it a privelige to serve you.

## 2018-12-02 ENCOUNTER — Encounter: Payer: Self-pay | Admitting: Family Medicine

## 2018-12-02 NOTE — Assessment & Plan Note (Signed)
Annual exam as documented. Counseling done  re healthy lifestyle involving commitment to 150 minutes exercise per week, heart healthy diet, and attaining healthy weight.The importance of adequate sleep also discussed.  Immunization and cancer screening needs are specifically addressed at this visit.  

## 2018-12-02 NOTE — Assessment & Plan Note (Signed)
Improved   Patient re-educated about  the importance of commitment to a  minimum of 150 minutes of exercise per week as able.  The importance of healthy food choices with portion control discussed, as well as eating regularly and within a 12 hour window most days. The need to choose "clean , green" food 50 to 75% of the time is discussed, as well as to make water the primary drink and set a goal of 64 ounces water daily.  Encouraged to start a food diary,  and to consider  joining a support group. Sample diet sheets offered. Goals set by the patient for the next several months.   Weight /BMI 11/30/2018 11/07/2018 08/09/2018  WEIGHT 247 lb 1.9 oz 240 lb 254 lb  HEIGHT 5\' 4"  5\' 4"  5\' 4"   BMI 42.42 kg/m2 41.2 kg/m2 43.6 kg/m2

## 2018-12-05 ENCOUNTER — Inpatient Hospital Stay (HOSPITAL_COMMUNITY): Payer: Medicare Other | Attending: Hematology | Admitting: Nurse Practitioner

## 2018-12-05 ENCOUNTER — Inpatient Hospital Stay (HOSPITAL_COMMUNITY): Payer: Medicare Other

## 2018-12-05 ENCOUNTER — Other Ambulatory Visit: Payer: Self-pay

## 2018-12-05 VITALS — BP 148/70 | HR 94 | Temp 98.3°F | Resp 18 | Wt 245.9 lb

## 2018-12-05 DIAGNOSIS — Z13820 Encounter for screening for osteoporosis: Secondary | ICD-10-CM

## 2018-12-05 DIAGNOSIS — I509 Heart failure, unspecified: Secondary | ICD-10-CM | POA: Diagnosis not present

## 2018-12-05 DIAGNOSIS — Z853 Personal history of malignant neoplasm of breast: Secondary | ICD-10-CM | POA: Insufficient documentation

## 2018-12-05 DIAGNOSIS — Z87891 Personal history of nicotine dependence: Secondary | ICD-10-CM | POA: Diagnosis not present

## 2018-12-05 DIAGNOSIS — Z8049 Family history of malignant neoplasm of other genital organs: Secondary | ICD-10-CM | POA: Diagnosis not present

## 2018-12-05 DIAGNOSIS — Z801 Family history of malignant neoplasm of trachea, bronchus and lung: Secondary | ICD-10-CM | POA: Diagnosis not present

## 2018-12-05 DIAGNOSIS — Z79899 Other long term (current) drug therapy: Secondary | ICD-10-CM | POA: Diagnosis not present

## 2018-12-05 DIAGNOSIS — Z171 Estrogen receptor negative status [ER-]: Secondary | ICD-10-CM | POA: Diagnosis not present

## 2018-12-05 DIAGNOSIS — Z9012 Acquired absence of left breast and nipple: Secondary | ICD-10-CM | POA: Diagnosis not present

## 2018-12-05 DIAGNOSIS — Z886 Allergy status to analgesic agent status: Secondary | ICD-10-CM | POA: Diagnosis not present

## 2018-12-05 DIAGNOSIS — Z8249 Family history of ischemic heart disease and other diseases of the circulatory system: Secondary | ICD-10-CM | POA: Diagnosis not present

## 2018-12-05 DIAGNOSIS — Z9221 Personal history of antineoplastic chemotherapy: Secondary | ICD-10-CM | POA: Insufficient documentation

## 2018-12-05 DIAGNOSIS — E669 Obesity, unspecified: Secondary | ICD-10-CM | POA: Diagnosis not present

## 2018-12-05 DIAGNOSIS — Z8042 Family history of malignant neoplasm of prostate: Secondary | ICD-10-CM | POA: Diagnosis not present

## 2018-12-05 DIAGNOSIS — C50912 Malignant neoplasm of unspecified site of left female breast: Secondary | ICD-10-CM

## 2018-12-05 DIAGNOSIS — Z923 Personal history of irradiation: Secondary | ICD-10-CM | POA: Insufficient documentation

## 2018-12-05 DIAGNOSIS — Z820 Family history of epilepsy and other diseases of the nervous system: Secondary | ICD-10-CM | POA: Diagnosis not present

## 2018-12-05 DIAGNOSIS — Z803 Family history of malignant neoplasm of breast: Secondary | ICD-10-CM | POA: Insufficient documentation

## 2018-12-05 DIAGNOSIS — Z841 Family history of disorders of kidney and ureter: Secondary | ICD-10-CM | POA: Diagnosis not present

## 2018-12-05 DIAGNOSIS — Z880 Allergy status to penicillin: Secondary | ICD-10-CM | POA: Insufficient documentation

## 2018-12-05 LAB — COMPREHENSIVE METABOLIC PANEL
ALT: 12 U/L (ref 0–44)
AST: 18 U/L (ref 15–41)
Albumin: 3.8 g/dL (ref 3.5–5.0)
Alkaline Phosphatase: 81 U/L (ref 38–126)
Anion gap: 7 (ref 5–15)
BUN: 15 mg/dL (ref 8–23)
CO2: 27 mmol/L (ref 22–32)
Calcium: 8.8 mg/dL — ABNORMAL LOW (ref 8.9–10.3)
Chloride: 102 mmol/L (ref 98–111)
Creatinine, Ser: 1.01 mg/dL — ABNORMAL HIGH (ref 0.44–1.00)
GFR calc Af Amer: >60 mL/min (ref >60)
GFR calc non Af Amer: 54 mL/min — ABNORMAL LOW (ref >60)
Glucose, Bld: 96 mg/dL (ref 70–99)
Potassium: 3.4 mmol/L — ABNORMAL LOW (ref 3.5–5.1)
Sodium: 136 mmol/L (ref 135–145)
Total Bilirubin: 0.8 mg/dL (ref 0.3–1.2)
Total Protein: 7.9 g/dL (ref 6.5–8.1)

## 2018-12-05 LAB — CBC WITH DIFFERENTIAL/PLATELET
Abs Immature Granulocytes: 0.04 10*3/uL (ref 0.00–0.07)
Basophils Absolute: 0 10*3/uL (ref 0.0–0.1)
Basophils Relative: 0 %
Eosinophils Absolute: 0.2 10*3/uL (ref 0.0–0.5)
Eosinophils Relative: 2 %
HCT: 42.4 % (ref 36.0–46.0)
Hemoglobin: 13.2 g/dL (ref 12.0–15.0)
Immature Granulocytes: 0 %
Lymphocytes Relative: 30 %
Lymphs Abs: 3.1 10*3/uL (ref 0.7–4.0)
MCH: 30.1 pg (ref 26.0–34.0)
MCHC: 31.1 g/dL (ref 30.0–36.0)
MCV: 96.6 fL (ref 80.0–100.0)
Monocytes Absolute: 0.8 10*3/uL (ref 0.1–1.0)
Monocytes Relative: 7 %
Neutro Abs: 6.3 10*3/uL (ref 1.7–7.7)
Neutrophils Relative %: 61 %
Platelets: 369 10*3/uL (ref 150–400)
RBC: 4.39 MIL/uL (ref 3.87–5.11)
RDW: 13.9 % (ref 11.5–15.5)
WBC: 10.4 10*3/uL (ref 4.0–10.5)
nRBC: 0 % (ref 0.0–0.2)

## 2018-12-05 NOTE — Assessment & Plan Note (Addendum)
1.  Stage IIb left breast medullary carcinoma: - Status post left modified radical mastectomy on 05/30/1996, 4 out of 13 lymph nodes positive, ER/PR negative. -Status post CMF x6 cycles followed by radiation therapy. - Today's physical examination did not reveal any palpable nodes at the left mastectomy site.  Right breast had no palpable masses or no adenopathy. -Her last mammogram in 12/07/2017 showed B RADS category 2 benign. -She is scheduled for her next mammogram on 12/20/2018.  We will call her back if there is any abnormalities.  We also ordered a DEXA scan to be completed on 12/20/2018 we will call her with the results. -Labs on 12/05/2018 showed her WBC 10.4, hemoglobin 13.2, platelets 369, potassium 3.4, creatinine 1.01. - Otherwise we will see her back in the office in 1 year with repeat mammogram and labs.

## 2018-12-05 NOTE — Patient Instructions (Signed)
[place] at [facility] Discharge Instructions  Follow up in 1 year with repeat labs and mammogram.    Thank you for choosing [place] at [facility] to provide your oncology and hematology care.  To afford each patient quality time with our provider, please arrive at least 15 minutes before your scheduled appointment time.   If you have a lab appointment with the [facility] please come in thru the  Main Entrance and check in at the main information desk  You need to re-schedule your appointment should you arrive 10 or more minutes late.  We strive to give you quality time with our providers, and arriving late affects you and other patients whose appointments are after yours.  Also, if you no show three or more times for appointments you may be dismissed from the clinic at the providers discretion.     Again, thank you for choosing [facility].  Our hope is that these requests will decrease the amount of time that you wait before being seen by our physicians.       _____________________________________________________________  Should you have questions after your visit to [facility], please contact our office at [phone] between the hours of 8:00 a.m. and 4:30 p.m.  Voicemails left after 4:00 p.m. will not be returned until the following business day.  For prescription refill requests, have your pharmacy contact our office and allow 72 hours.    Cancer Center Support Programs:   > Cancer Support Group  2nd Tuesday of the month 1pm-2pm, Journey Room

## 2018-12-05 NOTE — Progress Notes (Signed)
Heather [name], [place] [address]   CLINIC:  Medical Oncology/Hematology  PCP:  Fayrene Helper, MD [address] [place] [address] [phone]   REASON FOR VISIT: Follow-up for breast cancer  CURRENT THERAPY: Surveillance per NCCN guidelines  BRIEF ONCOLOGIC HISTORY:  Oncology History   stage II B. 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 All therapy finished as of September 1998      History of Invasive medullary carcinoma of left breast   05/30/1996 Surgery    Modified radical mastectomy demonstrating a Stage IIB (T2N1M0) medullary carcinoma of left breast with 4/13 positive lymph nodes.  ER/PR negative.    08/07/1996 - 01/01/1997 Chemotherapy    CMFx 6 cycles    01/24/1997 - 04/16/1997 Radiation Therapy    Approximate start and completion dates.  A few breaks in radiation therapy due to desquamation      CANCER STAGING: Cancer Staging History of Invasive medullary carcinoma of left breast Staging form: Breast, AJCC 6th Edition - Clinical: Stage IIB (T2, N1, M0) - Signed by [facility] Cancer, PA-C on 10/03/2013    INTERVAL HISTORY:  Heather [name] 78 y.o. female returns for routine follow-up for breast cancer.  Patient states she has been doing well since her last visit.  She has had no problems and has no complaints at this time. Denies any nausea, vomiting, or diarrhea. Denies any new pains. Had not noticed any recent bleeding such as epistaxis, hematuria or hematochezia. Denies recent chest pain on exertion, shortness of breath on minimal exertion, pre-syncopal episodes, or palpitations. Denies any numbness or tingling in hands or feet. Denies any recent fevers, infections, or recent hospitalizations. Patient reports appetite at  100% and energy level at 100%.  She is eating well and maintaining her weight at this time.  She reports she gets adequate exercise daily.    REVIEW OF SYSTEMS:  Review of Systems  All other systems reviewed and are negative.    PAST MEDICAL/SURGICAL HISTORY:  Past Medical History:  Diagnosis Date  . Asthma   . Breast cancer ([place]) 1998   left  . Congestive heart failure ([place])   . FH: CAD (coronary artery disease)   . GERD (gastroesophageal reflux disease)   . Heart failure   . Hypertension 1998  . 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 Personal history of malignant neoplasm of breast    Past Surgical History:  Procedure Laterality Date  . ABDOMINAL HYSTERECTOMY  1973   fibroids  . BREAST SURGERY Left 1998   breast ca  . FLEXIBLE SIGMOIDOSCOPY N/A 05/25/2016   Procedure: FLEXIBLE SIGMOIDOSCOPY;  Surgeon: Danie Binder, MD;  Location: [facility]ITE;  Service: Endoscopy;  Laterality: N/A;  2:00 AM  . MASTECTOMY  1997   left breast   . TOTAL ABDOMINAL HYSTERECTOMY W/ BILATERAL SALPINGOOPHORECTOMY  1971   unilateral   . VESICOVAGINAL FISTULA CLOSURE W/ TAH  1971     SOCIAL HISTORY:  Social History   Socioeconomic History  . Marital status: Widowed    Spouse name: Not on file  . Number of children: 6  . Years of education: Not on file  .  Highest education level: Some college, no degree  Occupational History  . Occupation: disabled in [place]  . Financial resource strain: Not hard at all  . Food insecurity:    Worry: Never true    Inability: Never true  . Transportation needs:    Medical: No    Non-medical: No  Tobacco Use  . Smoking status: Former Smoker    Years: 40.00    Types: Cigarettes    Last attempt to quit: 07/19/1998    Years since quitting: 20.3  . Smokeless tobacco: Never Used  Substance and Sexual Activity  . Alcohol use: No  . Drug  use: No  . Sexual activity: Not Currently  Lifestyle  . Physical activity:    Days per week: 0 days    Minutes per session: 0 min  . Stress: Not at all  Relationships  . Social connections:    Talks on phone: More than three times a week    Gets together: More than three times a week    Attends religious service: More than 4 times per year    Active member of club or organization: No    Attends meetings of clubs or organizations: Never    Relationship status: Divorced  . Intimate partner violence:    Fear of current or ex partner: No    Emotionally abused: No    Physically abused: No    Forced sexual activity: No  Other Topics Concern  . Not on file  Social History Narrative  . Not on file    FAMILY HISTORY:  Family History  Problem Relation Age of Onset  . Parkinsonism Mother   . Prostate cancer Father   . Kidney disease Sister        nephrectomy  . Cancer Brother        throat  . Coronary artery disease Unknown        Family History of males   . Breast cancer Sister   . Lung cancer Sister   . Breast cancer Sister   . Cervical cancer Sister     CURRENT MEDICATIONS:  Outpatient Encounter Medications as of 12/05/2018  Medication Sig  . albuterol (VENTOLIN HFA) 108 (90 Base) MCG/ACT inhaler Inhale 2 puffs into the lungs every 6 (six) hours as needed for wheezing or shortness of breath.  . cholecalciferol (VITAMIN D) 400 UNITS TABS tablet Take 400 Units by mouth daily.  . fluticasone furoate-vilanterol (BREO ELLIPTA) 100-25 MCG/INH AEPB Inhale into the lungs.  Marland Kitchen losartan-hydrochlorothiazide (HYZAAR) 100-25 MG tablet Take by mouth.  . montelukast (SINGULAIR) 10 MG tablet Take 1 tablet (10 mg total) by mouth at bedtime.  . potassium chloride SA (K-DUR) 20 MEQ tablet TAKE ONE TABLET BY MOUTH TWICE A DAY  . [DISCONTINUED] hydrochlorothiazide (MICROZIDE) 12.5 MG capsule Take 1 capsule (12.5 mg total) by mouth daily.  . [DISCONTINUED] losartan (COZAAR) 100 MG tablet Take 1  tablet (100 mg total) by mouth daily.   No facility-administered encounter medications on file as of 12/05/2018.     ALLERGIES:  Allergies  Allergen Reactions  . Aspirin Anaphylaxis and Swelling  . Penicillins Swelling    Causes throat to swell Has patient had a PCN reaction causing immediate rash, facial/tongue/throat swelling, SOB or lightheadedness with hypotension: Yes Has patient had a PCN reaction causing severe rash involving mucus membranes or skin necrosis: No Has patient had a PCN reaction that required hospitalization No Has patient had a PCN reaction occurring within the  last 10 years: No If all of the above answers are "NO", then may proceed with Cephalosporin use.      PHYSICAL EXAM:  ECOG Performance status: 1  Vitals:   12/05/18 1403  BP: (!) 148/70  Pulse: 94  Resp: 18  Temp: 98.3 F (36.8 C)  SpO2: 94%   Filed Weights   12/05/18 1403  Weight: 245 lb 14.4 oz (111.5 kg)    Physical Exam Constitutional:      Appearance: She is obese.  Cardiovascular:     Rate and Rhythm: Normal rate and regular rhythm.     Heart sounds: Normal heart sounds.  Pulmonary:     Effort: Pulmonary effort is normal.     Breath sounds: Normal breath sounds.  Abdominal:     General: Bowel sounds are normal.     Palpations: Abdomen is soft.  Musculoskeletal: Normal range of motion.  Skin:    General: Skin is warm and dry.  Neurological:     Mental Status: She is alert and oriented to person, place, and time. Mental status is at baseline.  Psychiatric:        Mood and Affect: Mood normal.        Behavior: Behavior normal.        Thought Content: Thought content normal.        Judgment: Judgment normal.   Breast: LEFT: Left mastectomy site within normal limits no masses felt RIGHT: No palpable masses, no skin changes or nipple discharge, no adenopathy.  LABORATORY DATA:  I have reviewed the labs as listed.  CBC    Component Value Date/Time   WBC 10.4 12/05/2018  1345   RBC 4.39 12/05/2018 1345   HGB 13.2 12/05/2018 1345   HCT 42.4 12/05/2018 1345   PLT 369 12/05/2018 1345   MCV 96.6 12/05/2018 1345   MCH 30.1 12/05/2018 1345   MCHC 31.1 12/05/2018 1345   RDW 13.9 12/05/2018 1345   LYMPHSABS 3.1 12/05/2018 1345   MONOABS 0.8 12/05/2018 1345   EOSABS 0.2 12/05/2018 1345   BASOSABS 0.0 12/05/2018 1345   CMP Latest Ref Rng & Units 12/05/2018 08/09/2018 11/25/2017  Glucose 70 - 99 mg/dL 96 93 95  BUN 8 - 23 mg/dL 15 17 14   Creatinine 0.44 - 1.00 mg/dL 1.01(H) 1.03(H) 0.88  Sodium 135 - 145 mmol/L 136 139 139  Potassium 3.5 - 5.1 mmol/L 3.4(L) 4.3 4.1  Chloride 98 - 111 mmol/L 102 99 102  CO2 22 - 32 mmol/L 27 31 31   Calcium 8.9 - 10.3 mg/dL 8.8(L) 9.4 9.3  Total Protein 6.5 - 8.1 g/dL 7.9 7.5 7.1  Total Bilirubin 0.3 - 1.2 mg/dL 0.8 0.5 0.5  Alkaline Phos 38 - 126 U/L 81 - -  AST 15 - 41 U/L 18 12 12   ALT 0 - 44 U/L 12 6 6     I personally performed a face-to-face visit.  All questions were answered to patient's stated satisfaction. Encouraged patient to call with any new concerns or questions before his next visit to the cancer center and we can certain see him sooner, if needed.     ASSESSMENT & PLAN:   History of Invasive medullary carcinoma of left breast 1.  Stage IIb left breast medullary carcinoma: - Status post left modified radical mastectomy on 05/30/1996, 4 out of 13 lymph nodes positive, ER/PR negative. -Status post CMF x6 cycles followed by radiation therapy. - Today's physical examination did not reveal any palpable nodes at the left mastectomy site.  Right breast had no palpable masses or no adenopathy. -Her last mammogram in 12/07/2017 showed B RADS category 2 benign. -She is scheduled for her next mammogram on 12/20/2018.  We will call her back if there is any abnormalities.  We also ordered a DEXA scan to be completed on 12/20/2018 we will call her with the results. -Labs on 12/05/2018 showed her WBC 10.4, hemoglobin 13.2,  platelets 369, potassium 3.4, creatinine 1.01. - Otherwise we will see her back in the office in 1 year with repeat mammogram and labs.      Orders placed this encounter:  Orders Placed This Encounter  Procedures  . DG Bone Density  . Lactate dehydrogenase  . CBC with Differential/Platelet  . Comprehensive metabolic panel  . Vitamin B12  . VITAMIN D 25 Hydroxy (Vit-D Deficiency, Fractures)     Francene Finders, FNP-C [place] [phone]

## 2018-12-13 ENCOUNTER — Other Ambulatory Visit (HOSPITAL_COMMUNITY): Payer: Self-pay | Admitting: Family Medicine

## 2018-12-13 DIAGNOSIS — Z1231 Encounter for screening mammogram for malignant neoplasm of breast: Secondary | ICD-10-CM

## 2018-12-20 ENCOUNTER — Other Ambulatory Visit (HOSPITAL_COMMUNITY): Payer: Medicare Other

## 2018-12-20 ENCOUNTER — Encounter (HOSPITAL_COMMUNITY): Payer: Medicare Other

## 2018-12-22 ENCOUNTER — Other Ambulatory Visit: Payer: Self-pay

## 2018-12-22 ENCOUNTER — Ambulatory Visit (HOSPITAL_COMMUNITY)
Admission: RE | Admit: 2018-12-22 | Discharge: 2018-12-22 | Disposition: A | Discharge status: Home / Self Care | Payer: Medicare Other | Source: Ambulatory Visit | Attending: Family Medicine | Admitting: Family Medicine

## 2018-12-22 ENCOUNTER — Ambulatory Visit (HOSPITAL_COMMUNITY)
Admission: RE | Admit: 2018-12-22 | Discharge: 2018-12-22 | Disposition: A | Discharge status: Home / Self Care | Payer: Medicare Other | Source: Ambulatory Visit | Attending: Nurse Practitioner | Admitting: Nurse Practitioner

## 2018-12-22 DIAGNOSIS — Z780 Asymptomatic menopausal state: Secondary | ICD-10-CM | POA: Insufficient documentation

## 2018-12-22 DIAGNOSIS — Z1231 Encounter for screening mammogram for malignant neoplasm of breast: Secondary | ICD-10-CM | POA: Diagnosis not present

## 2018-12-22 DIAGNOSIS — Z13820 Encounter for screening for osteoporosis: Secondary | ICD-10-CM | POA: Diagnosis not present

## 2018-12-22 DIAGNOSIS — M85852 Other specified disorders of bone density and structure, left thigh: Secondary | ICD-10-CM | POA: Diagnosis not present

## 2018-12-22 DIAGNOSIS — M85851 Other specified disorders of bone density and structure, right thigh: Secondary | ICD-10-CM | POA: Diagnosis not present

## 2018-12-22 DIAGNOSIS — M810 Age-related osteoporosis without current pathological fracture: Secondary | ICD-10-CM | POA: Diagnosis not present

## 2019-02-20 ENCOUNTER — Ambulatory Visit: Payer: Medicare Other

## 2019-02-21 ENCOUNTER — Ambulatory Visit: Payer: Medicare Other | Admitting: Family Medicine

## 2019-05-15 ENCOUNTER — Ambulatory Visit: Payer: Medicare Other | Admitting: Family Medicine

## 2019-05-19 ENCOUNTER — Other Ambulatory Visit: Payer: Self-pay | Admitting: Family Medicine

## 2019-05-23 ENCOUNTER — Other Ambulatory Visit: Payer: Self-pay

## 2019-05-23 ENCOUNTER — Encounter: Payer: Self-pay | Admitting: Family Medicine

## 2019-05-23 ENCOUNTER — Ambulatory Visit (INDEPENDENT_AMBULATORY_CARE_PROVIDER_SITE_OTHER): Payer: Medicare Other | Admitting: Family Medicine

## 2019-05-23 VITALS — BP 148/88 | HR 96 | Temp 98.9°F | Resp 15 | Ht 64.0 in | Wt 249.1 lb

## 2019-05-23 DIAGNOSIS — Z23 Encounter for immunization: Secondary | ICD-10-CM

## 2019-05-23 DIAGNOSIS — I10 Essential (primary) hypertension: Secondary | ICD-10-CM | POA: Diagnosis not present

## 2019-05-23 NOTE — Patient Instructions (Addendum)
  I appreciate the opportunity to provide you with the care for your health and wellness. Today we discussed:   Follow up:  2-3 months   No labs or referrals today  Please continue current medications at this time.  We will follow up in 2-3 months to check Blood Pressure in office and see how you are doing.  During this time work to avoid SALT, FRIED, FATTY foods. Avoid pre made meals or frozen meals as these contain a lot of SALT which can cause Blood Pressure to be high.  Walk daily 30 minutes.  And drink 4-6 cups of water daily.  Please continue to practice social distancing to keep you, your family, and our community safe.  If you must go out, please wear a mask and practice good handwashing.  It was a pleasure to see you and I look forward to continuing to work together on your health and well-being. Please do not hesitate to call the office if you need care or have questions about your care.  Have a wonderful day and week. With Gratitude, Cherly Beach, DNP, AGNP-BC

## 2019-05-23 NOTE — Progress Notes (Signed)
Subjective:     Patient ID: Heather [name], female   DOB: [birth year], 78 y.o.   MRN: [id number]  Heather [name] presents for Follow-up (follow up) and Hypertension  Here for follow-up of hypertension. Not really exercising much, but tries to stay mobile/active. Granddaughter lives in an apartment and she walks when her granddaughter rides her bike. So she walks around about 5 times. Reports eating cabbage, broccoli, and collard greens. She also reports eating some fried foods, but is trying to wean off. Does eat alfredo sauce items and pre-made foods.  Blood pressure up and down with  controlled at home.  SBP usually ranges 130-150.   DBP usually ranges 70-90.   Cardiac symptoms: none.  Patient denies: chest pain, chest pressure/discomfort, claudication, dyspnea, exertional chest pressure/discomfort, fatigue, irregular heart beat, lower extremity edema, near-syncope, orthopnea, palpitations, paroxysmal nocturnal dyspnea, syncope and tachypnea.  Cardiovascular risk factors: advanced age (older than 72 for men, 39 for women), dyslipidemia, hypertension, obesity (BMI >= 30 kg/m2) and sedentary lifestyle. Use of agents associated with hypertension: none.   Reports taking all medications as directed and denies side effects.  Today patient denies signs and symptoms of COVID 19 infection including fever, chills, cough, shortness of breath, and headache.  Past Medical, Surgical, Social History, Allergies, and Medications have been Reviewed.   Past Medical History:  Diagnosis Date  . Asthma   . Breast cancer ([place]) 1998   left  . Congestive heart failure ([place])   . FH: CAD (coronary artery disease)   . GERD (gastroesophageal reflux disease)   . Heart failure   . Hypertension 1998  . 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 Personal history of malignant neoplasm of breast    Past Surgical History:   Procedure Laterality Date  . ABDOMINAL HYSTERECTOMY  1973   fibroids  . BREAST SURGERY Left 1998   breast ca  . FLEXIBLE SIGMOIDOSCOPY N/A 05/25/2016   Procedure: FLEXIBLE SIGMOIDOSCOPY;  Surgeon: Danie Binder, MD;  Location: [facility];  Service: Endoscopy;  Laterality: N/A;  2:00 AM  . MASTECTOMY  1997   left breast   . TOTAL ABDOMINAL HYSTERECTOMY W/ BILATERAL SALPINGOOPHORECTOMY  1971   unilateral   . VESICOVAGINAL FISTULA CLOSURE W/ TAH  1971   Social History   Socioeconomic History  . Marital status: Widowed    Spouse name: Not on file  . Number of children: 6  . Years of education: Not on file  . Highest education level: Some college, no degree  Occupational History  . Occupation: disabled in [place]  . Financial resource strain: Not hard at all  . Food insecurity    Worry: Never true    Inability: Never true  . Transportation needs    Medical: No    Non-medical: No  Tobacco Use  . Smoking status: Former Smoker    Years: 40.00    Types: Cigarettes    Quit date: 07/19/1998    Years since quitting: 20.8  . Smokeless tobacco: Never Used  Substance and Sexual Activity  . Alcohol use: No  . Drug use: No  . Sexual activity: Not Currently  Lifestyle  . Physical activity    Days per week: 0 days    Minutes per session: 0 min  . Stress: Not at all  Relationships  . Social connections  Talks on phone: More than three times a week    Gets together: More than three times a week    Attends religious service: More than 4 times per year    Active member of club or organization: No    Attends meetings of clubs or organizations: Never    Relationship status: Divorced  . Intimate partner violence    Fear of current or ex partner: No    Emotionally abused: No    Physically abused: No    Forced sexual activity: No  Other Topics Concern  . Not on file  Social History Narrative  . Not on file    Outpatient Encounter Medications as of  05/23/2019  Medication Sig  . albuterol (VENTOLIN HFA) 108 (90 Base) MCG/ACT inhaler Inhale 2 puffs into the lungs every 6 (six) hours as needed for wheezing or shortness of breath.  . cholecalciferol (VITAMIN D) 400 UNITS TABS tablet Take 400 Units by mouth daily.  . fluticasone furoate-vilanterol (BREO ELLIPTA) 100-25 MCG/INH AEPB Inhale into the lungs.  Marland Kitchen losartan-hydrochlorothiazide (HYZAAR) 100-25 MG tablet Take by mouth.  . montelukast (SINGULAIR) 10 MG tablet Take 1 tablet (10 mg total) by mouth at bedtime.  . potassium chloride SA (K-DUR) 20 MEQ tablet TAKE ONE TABLET BY MOUTH TWICE A DAY   No facility-administered encounter medications on file as of 05/23/2019.    Allergies  Allergen Reactions  . Aspirin Anaphylaxis and Swelling  . Penicillins Swelling    Causes throat to swell Has patient had a PCN reaction causing immediate rash, facial/tongue/throat swelling, SOB or lightheadedness with hypotension: Yes Has patient had a PCN reaction causing severe rash involving mucus membranes or skin necrosis: No Has patient had a PCN reaction that required hospitalization No Has patient had a PCN reaction occurring within the last 10 years: No If all of the above answers are "NO", then may proceed with Cephalosporin use.     Review of Systems  Constitutional: Negative for chills and fever.  HENT: Negative.   Eyes: Negative.   Respiratory: Negative.   Cardiovascular: Negative.   Gastrointestinal: Negative.   Endocrine: Negative.   Genitourinary: Negative.   Musculoskeletal: Negative.   Skin: Negative.   Allergic/Immunologic: Negative.   Neurological: Negative.   Hematological: Negative.   Psychiatric/Behavioral: Negative.   All other systems reviewed and are negative.      Objective:     There were no vitals taken for this visit.  Physical Exam Vitals signs and nursing note reviewed.  Constitutional:      Appearance: Normal appearance. She is well-developed and  well-groomed. She is morbidly obese.  HENT:     Head: Normocephalic and atraumatic.     Right Ear: External ear normal.     Left Ear: External ear normal.     Nose: Nose normal.     Mouth/Throat:     Mouth: Mucous membranes are moist.     Pharynx: Oropharynx is clear.  Eyes:     General:        Right eye: No discharge.        Left eye: No discharge.     Conjunctiva/sclera: Conjunctivae normal.  Neck:     Musculoskeletal: Normal range of motion and neck supple.  Cardiovascular:     Rate and Rhythm: Normal rate and regular rhythm.     Pulses: Normal pulses.     Heart sounds: Normal heart sounds.  Pulmonary:     Effort: Pulmonary effort is normal.  Breath sounds: Normal breath sounds.  Musculoskeletal: Normal range of motion.  Skin:    General: Skin is warm.  Neurological:     General: No focal deficit present.     Mental Status: She is alert and oriented to person, place, and time.  Psychiatric:        Attention and Perception: Attention normal.        Mood and Affect: Mood normal.        Speech: Speech normal.        Behavior: Behavior normal. Behavior is cooperative.        Thought Content: Thought content normal.        Cognition and Memory: Cognition normal.        Judgment: Judgment normal.        Assessment and Plan        1. Essential hypertension LAELYNN DESCHAINE is encouraged to maintain a well balanced diet that is low in salt. Not well controlled, has poor diet control.  No refills needed.  Additionally, is also reminded that exercise is beneficial for heart health and control of  Blood pressure. 30-60 minutes daily is recommended-walking was suggested.  Follow up 2-3 months to check and see if medications need to be adjusted. She reports she is taking a combo pill at this time.  2. MORBID OBESITY Deteriorated  MAREESA NARA is re-educated about the importance of exercise daily to help with weight management. A minumum of 30 minutes daily is  recommended. Additionally, importance of healthy food choices  with portion control discussed. Encouraged to start a food diary, count calories and to consider joining a  support group if possible.   Wt Readings from Last 3 Encounters:  05/23/19 249 lb 1.3 oz (113 kg)  12/05/18 245 lb 14.4 oz (111.5 kg)  11/30/18 247 lb 1.9 oz (112.1 kg)     3. Need for immunization against influenza Patient was educated on the recommendation for flu vaccine. After obtaining informed consent, the vaccine was administered no adverse effects noted at time of administration. Patient provided with education on arm soreness and use of tylenol or ibuprofen (if safe) for this. Encourage to use the arm vaccine was given in to help reduce the soreness. Patient educated on the signs of a reaction to the vaccine and advised to contact the office should these occur.   - Flu Vaccine QUAD High Dose(Fluad)   Follow Up: 5 months   Perlie Mayo, DNP, AGNP-BC [place], [address] [place], [place] [address] Office Hours: Mon-Thurs 8 am-5 pm; Fri 8 am-12 pm Office Phone:  [phone]  Office Fax: [phone]

## 2019-05-29 ENCOUNTER — Other Ambulatory Visit: Payer: Self-pay

## 2019-05-29 MED ORDER — LOSARTAN POTASSIUM-HCTZ 100-12.5 MG PO TABS: 1.0000 | ORAL_TABLET | Freq: Every day | ORAL | 1 refills | Status: DC

## 2019-08-22 ENCOUNTER — Ambulatory Visit: Payer: Medicare Other | Admitting: Family Medicine

## 2019-08-23 ENCOUNTER — Ambulatory Visit: Payer: Medicare Other | Admitting: Family Medicine

## 2019-08-29 ENCOUNTER — Ambulatory Visit: Payer: Medicare Other | Admitting: Family Medicine

## 2019-08-30 ENCOUNTER — Ambulatory Visit (INDEPENDENT_AMBULATORY_CARE_PROVIDER_SITE_OTHER): Payer: Medicare Other | Admitting: Family Medicine

## 2019-08-30 ENCOUNTER — Other Ambulatory Visit: Payer: Self-pay

## 2019-08-30 ENCOUNTER — Encounter: Payer: Self-pay | Admitting: Family Medicine

## 2019-08-30 VITALS — BP 162/100 | HR 81 | Temp 98.2°F | Resp 15 | Ht 64.0 in | Wt 250.1 lb

## 2019-08-30 DIAGNOSIS — I10 Essential (primary) hypertension: Secondary | ICD-10-CM

## 2019-08-30 DIAGNOSIS — E785 Hyperlipidemia, unspecified: Secondary | ICD-10-CM

## 2019-08-30 MED ORDER — AMLODIPINE BESYLATE 5 MG PO TABS: 5.0000 mg | ORAL_TABLET | Freq: Every day | ORAL | 3 refills | Status: DC

## 2019-08-30 NOTE — Assessment & Plan Note (Addendum)
Heather [name] is encouraged to maintain a well balanced diet that is low in salt. NOT Controlled, continue current medication regimen.  No refills needed. DECLINED ADDITIONAL MEDICATION  Additionally, she is also reminded that exercise is beneficial for heart health and control of  Blood pressure. 30-60 minutes daily is recommended-walking was suggested.  Close follow up in 3 weeks. Strongly suggest addition of norvasc if not improved

## 2019-08-30 NOTE — Progress Notes (Signed)
Subjective:  Patient ID: Heather [name], female    DOB: 1940/07/26  Age: 79 y.o. MRN: [id number]  CC:  Chief Complaint  Patient presents with  . Hypertension    bp evaluation      HPI  HPI  Heather [name] is a 79 year old female patient of Dr. Griffin Dakin.  He presents today for follow-up on her blood pressure.  Been having some time over the last year trying to get this under control.  She was educated at the last time I spoke with her to avoid salt, fried, fatty foods.  Avoid premade meals or frozen meals as those could contain a lot of hidden salt which she was not fully aware of last time that we spoke.  She is also been encouraged to get some form of exercise and to increase water intake.  She reports taking her blood pressure medicine Hyzaar as directed.  And without issue.  She reports that she is voiding and making urine okay.  She denies having any vision changes, headaches, dizziness, chest pain, palpitations, leg swelling cough or shortness of breath.  Today patient denies signs and symptoms of COVID 19 infection including fever, chills, cough, shortness of breath, and headache. Past Medical, Surgical, Social History, Allergies, and Medications have been Reviewed.   Past Medical History:  Diagnosis Date  . Annual physical exam 05/30/2014  . Asthma   . Breast cancer ([facility]) 1998   left  . Congestive heart failure ([facility])   . FATIGUE 09/20/2008   Qualifier: Diagnosis of  By: Moshe Cipro MD, Joycelyn Schmid    . FH: CAD (coronary artery disease)   . Gallstones 05/30/2014  . GERD (gastroesophageal reflux disease)   . Heart failure   . Hypertension 1998  . 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 Personal history of malignant neoplasm of breast     Current Meds  Medication Sig  . albuterol (VENTOLIN HFA) 108 (90 Base) MCG/ACT inhaler Inhale 2 puffs into the lungs every 6 (six) hours as  needed for wheezing or shortness of breath.  . cholecalciferol (VITAMIN D) 400 UNITS TABS tablet Take 400 Units by mouth daily.  . fluticasone furoate-vilanterol (BREO ELLIPTA) 100-25 MCG/INH AEPB Inhale into the lungs.  Marland Kitchen losartan-hydrochlorothiazide (HYZAAR) 100-12.5 MG tablet Take 1 tablet by mouth daily.  . montelukast (SINGULAIR) 10 MG tablet Take 1 tablet (10 mg total) by mouth at bedtime.  . potassium chloride SA (K-DUR) 20 MEQ tablet TAKE ONE TABLET BY MOUTH TWICE A DAY    ROS:  Review of Systems  HENT: Negative.   Eyes: Negative.   Respiratory: Negative.   Cardiovascular: Negative.   Gastrointestinal: Negative.   Genitourinary: Negative.   Musculoskeletal: Negative.   Skin: Negative.   Neurological: Negative.   Endo/Heme/Allergies: Negative.   Psychiatric/Behavioral: Negative.   All other systems reviewed and are negative.    Objective:   Today's Vitals: BP (!) 162/100   Pulse 81   Temp 98.2 F (36.8 C) (Temporal)   Resp 15   Ht 5\' 4"  (1.626 m)   Wt 250 lb 1.3 oz (113.4 kg)   SpO2 95%   BMI 42.93 kg/m  Vitals with BMI 08/30/2019 05/23/2019 12/05/2018  Height 5\' 4"  5\' 4"  -  Weight 250 lbs 1 oz 249 lbs 1 oz 245 lbs 14 oz  BMI 42.91 A999333 A999333  Systolic 0000000 123456 123456  Diastolic 123XX123 88 70  Pulse 81 96 94     Physical Exam Vitals and nursing note reviewed.  Constitutional:      Appearance: Normal appearance. She is well-developed and well-groomed. She is obese.  HENT:     Head: Normocephalic and atraumatic.     Right Ear: External ear normal.     Left Ear: External ear normal.     Nose: Nose normal.     Mouth/Throat:     Mouth: Mucous membranes are moist.     Pharynx: Oropharynx is clear.  Eyes:     General:        Right eye: No discharge.        Left eye: No discharge.     Conjunctiva/sclera: Conjunctivae normal.  Cardiovascular:     Rate and Rhythm: Normal rate and regular rhythm.     Pulses: Normal pulses.     Heart sounds: Normal heart sounds.    Pulmonary:     Effort: Pulmonary effort is normal.     Breath sounds: Normal breath sounds.  Musculoskeletal:        General: Normal range of motion.     Cervical back: Normal range of motion and neck supple.  Skin:    General: Skin is warm.  Neurological:     General: No focal deficit present.     Mental Status: She is alert and oriented to person, place, and time.  Psychiatric:        Attention and Perception: Attention normal.        Mood and Affect: Mood normal.        Speech: Speech normal.        Behavior: Behavior normal. Behavior is cooperative.        Thought Content: Thought content normal.        Cognition and Memory: Cognition normal.        Judgment: Judgment normal.     Assessment   1. Essential hypertension   2. Dyslipidemia     Tests ordered No orders of the defined types were placed in this encounter.    Plan: Please see assessment and plan per problem list above.   Meds ordered this encounter  Medications  . amLODipine (NORVASC) 5 MG tablet    Sig: Take 1 tablet (5 mg total) by mouth daily.    Dispense:  90 tablet    Refill:  3    Order Specific Question:   Supervising Provider    Answer:   Jacklynn Bue    Patient to follow-up in 09/20/2019   Perlie Mayo, NP

## 2019-08-30 NOTE — Patient Instructions (Addendum)
Happy New Year! May you have a year filled with hope, love, happiness and laughter.  I appreciate the opportunity to provide you with care for your health and wellness. Today we discussed: blood pressure  Follow up: 3 weeks in office for BP check  No labs or referrals today  During this time work to avoid SALT, FRIED, FATTY foods. Avoid pre made meals or frozen meals as these contain a lot of SALT which can cause Blood Pressure to be high.  Walk daily 30 minutes.  And drink 4-6 cups of water daily.  Please continue to practice social distancing to keep you, your family, and our community safe.  If you must go out, please wear a mask and practice good handwashing.  It was a pleasure to see you and I look forward to continuing to work together on your health and well-being. Please do not hesitate to call the office if you need care or have questions about your care.  Have a wonderful day and week. With Gratitude, Cherly Beach, DNP, AGNP-BC

## 2019-09-02 NOTE — Assessment & Plan Note (Signed)
She is encouraged to maintain a low-fat, heart healthy diet.

## 2019-09-20 ENCOUNTER — Other Ambulatory Visit: Payer: Self-pay

## 2019-09-20 ENCOUNTER — Ambulatory Visit (INDEPENDENT_AMBULATORY_CARE_PROVIDER_SITE_OTHER): Payer: Medicare Other | Admitting: Family Medicine

## 2019-09-20 ENCOUNTER — Encounter: Payer: Self-pay | Admitting: Family Medicine

## 2019-09-20 VITALS — BP 160/98 | HR 77 | Temp 97.5°F | Resp 15 | Ht 65.0 in | Wt 249.0 lb

## 2019-09-20 DIAGNOSIS — E785 Hyperlipidemia, unspecified: Secondary | ICD-10-CM

## 2019-09-20 DIAGNOSIS — I10 Essential (primary) hypertension: Secondary | ICD-10-CM | POA: Diagnosis not present

## 2019-09-20 NOTE — Progress Notes (Signed)
Subjective:  Patient ID: Heather [name], female    DOB: [birth year]  Age: 79 y.o. MRN: [id number]  CC:  Chief Complaint  Patient presents with  . Hypertension    follow up bp check      HPI  HPI  Ms. Krupicka is a 79 year old female patient of Dr. Griffin Dakin.  He presents today for follow-up on her blood pressure.  Been having some time over the last year trying to get this under control.  She was educated at the last time I spoke with her to avoid salt, fried, fatty foods.  Avoid premade meals or frozen meals as those could contain a lot of hidden salt which she was not fully aware of last time that we spoke.  She has also been encouraged to get some form of exercise and to increase water intake.  She reports taking her blood pressure medicine Hyzaar as directed.  And without issue.  She reports that she is voiding and making urine okay.  She denies having any vision changes, headaches, dizziness, chest pain, palpitations, leg swelling cough or shortness of breath. Last visit she refused additional agent.  Today patient denies signs and symptoms of COVID 19 infection including fever, chills, cough, shortness of breath, and headache. Past Medical, Surgical, Social History, Allergies, and Medications have been Reviewed.   Past Medical History:  Diagnosis Date  . Annual physical exam 05/30/2014  . Asthma   . Breast cancer ([facility]) 1998   left  . Congestive heart failure ([facility])   . FATIGUE 09/20/2008   Qualifier: Diagnosis of  By: Moshe Cipro MD, Joycelyn Schmid    . FH: CAD (coronary artery disease)   . Gallstones 05/30/2014  . GERD (gastroesophageal reflux disease)   . Heart failure   . Hypertension 1998  . 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 Personal history of malignant neoplasm of breast     Current Meds  Medication Sig  . albuterol (VENTOLIN HFA) 108 (90 Base) MCG/ACT inhaler Inhale 2  puffs into the lungs every 6 (six) hours as needed for wheezing or shortness of breath.  . cholecalciferol (VITAMIN D) 400 UNITS TABS tablet Take 400 Units by mouth daily.  . fluticasone furoate-vilanterol (BREO ELLIPTA) 100-25 MCG/INH AEPB Inhale into the lungs.  Marland Kitchen losartan-hydrochlorothiazide (HYZAAR) 100-12.5 MG tablet Take 1 tablet by mouth daily.  . montelukast (SINGULAIR) 10 MG tablet Take 1 tablet (10 mg total) by mouth at bedtime.  . potassium chloride SA (K-DUR) 20 MEQ tablet TAKE ONE TABLET BY MOUTH TWICE A DAY    ROS:  Review of Systems  Constitutional: Negative.   HENT: Negative.   Eyes: Negative.   Respiratory: Negative.   Cardiovascular: Negative.   Gastrointestinal: Negative.   Genitourinary: Negative.   Musculoskeletal: Negative.   Skin: Negative.   Neurological: Negative.   Endo/Heme/Allergies: Negative.   Psychiatric/Behavioral: Negative.   All other systems reviewed and are negative.    Objective:   Today's Vitals: BP (!) 160/98 Comment: left forearm  Pulse 77   Temp (!) 97.5 F (36.4 C) (Temporal)   Resp 15   Ht 5\' 5"  (1.651 m)   Wt 249 lb (112.9 kg)   SpO2 96%   BMI 41.44 kg/m  Vitals with BMI 09/20/2019 09/20/2019 09/20/2019  Height - - 5\' 5"   Weight - - 249 lbs  BMI - - A999333  Systolic  0000000 [id number] Q000111Q  Diastolic 98 123XX123 96  Pulse - - 77     Physical Exam Vitals and nursing note reviewed.  Constitutional:      Appearance: Normal appearance. She is well-developed and well-groomed. She is obese.  HENT:     Head: Normocephalic and atraumatic.     Right Ear: External ear normal.     Left Ear: External ear normal.     Mouth/Throat:     Comments: Mask in place  Eyes:     General:        Right eye: No discharge.        Left eye: No discharge.     Conjunctiva/sclera: Conjunctivae normal.  Cardiovascular:     Rate and Rhythm: Normal rate and regular rhythm.     Pulses: Normal pulses.     Heart sounds: Normal heart sounds.  Pulmonary:     Effort:  Pulmonary effort is normal.     Breath sounds: Normal breath sounds.  Musculoskeletal:        General: Normal range of motion.     Cervical back: Normal range of motion and neck supple.  Skin:    General: Skin is warm.  Neurological:     General: No focal deficit present.     Mental Status: She is alert and oriented to person, place, and time.  Psychiatric:        Attention and Perception: Attention normal.        Mood and Affect: Mood normal.        Speech: Speech normal.        Behavior: Behavior normal. Behavior is cooperative.        Thought Content: Thought content normal.        Cognition and Memory: Cognition normal.        Judgment: Judgment normal.     Assessment   1. Essential hypertension   2. Dyslipidemia     Tests ordered Orders Placed This Encounter  Procedures  . COMPLETE METABOLIC PANEL WITH GFR  . Lipid panel  . CBC     Plan: Please see assessment and plan per problem list above.   No orders of the defined types were placed in this encounter.   Patient to follow-up in Mid May for annual   Perlie Mayo, NP

## 2019-09-20 NOTE — Assessment & Plan Note (Signed)
NOT controlled, DECLINED another agent. Again encouraged to follow DASH diet, exercise 30-60 mins 5 days of the week at minimum   I have tried to encourage norvasc use. She reports feeling well. I will get updated labs, hoping kidney function is stable. Annual in May EKG then with Dr Moshe Cipro

## 2019-09-20 NOTE — Patient Instructions (Addendum)
I appreciate the opportunity to provide you with care for your health and wellness. Today we discussed: blood pressure   Follow up: Mid May for annual with Dr Moshe Cipro   Labs within the next fasting   No referrals today  During this time continue to work on avoid SALT, FRIED, FATTY foods. Avoid pre made meals or frozen meals as these contain a lot of SALT.  Walk daily 30 minutes.  And drink 4-6 cups of water daily.  Please continue to practice social distancing to keep you, your family, and our community safe.  If you must go out, please wear a mask and practice good handwashing.  It was a pleasure to see you and I look forward to continuing to work together on your health and well-being. Please do not hesitate to call the office if you need care or have questions about your care.  Have a wonderful day and week. With Gratitude, Cherly Beach, DNP, AGNP-BC

## 2019-09-20 NOTE — Assessment & Plan Note (Signed)
Needs updated labs placed today

## 2019-09-26 DIAGNOSIS — E785 Hyperlipidemia, unspecified: Secondary | ICD-10-CM | POA: Diagnosis not present

## 2019-09-26 DIAGNOSIS — I10 Essential (primary) hypertension: Secondary | ICD-10-CM | POA: Diagnosis not present

## 2019-09-26 LAB — COMPLETE METABOLIC PANEL WITH GFR
AG Ratio: 1.1 (calc) (ref 1.0–2.5)
ALT: 7 U/L (ref 6–29)
AST: 13 U/L (ref 10–35)
Albumin: 3.9 g/dL (ref 3.6–5.1)
Alkaline phosphatase (APISO): 83 U/L (ref 37–153)
BUN: 14 mg/dL (ref 7–25)
CO2: 31 mmol/L (ref 20–32)
Calcium: 9.1 mg/dL (ref 8.6–10.4)
Chloride: 102 mmol/L (ref 98–110)
Creat: 0.93 mg/dL (ref 0.60–0.93)
GFR, Est African American: 68 mL/min/{1.73_m2} (ref >=60)
GFR, Est Non African American: 59 mL/min/{1.73_m2} — ABNORMAL LOW (ref >=60)
Globulin: 3.5 g/dL (calc) (ref 1.9–3.7)
Glucose, Bld: 99 mg/dL (ref 65–99)
Potassium: 4.4 mmol/L (ref 3.5–5.3)
Sodium: 137 mmol/L (ref 135–146)
Total Bilirubin: 0.6 mg/dL (ref 0.2–1.2)
Total Protein: 7.4 g/dL (ref 6.1–8.1)

## 2019-09-26 LAB — LIPID PANEL
Cholesterol: 182 mg/dL (ref <200)
HDL: 45 mg/dL — ABNORMAL LOW (ref >=50)
LDL Cholesterol (Calc): 120 mg/dL (calc) — ABNORMAL HIGH
Non-HDL Cholesterol (Calc): 137 mg/dL (calc) — ABNORMAL HIGH (ref <130)
Total CHOL/HDL Ratio: 4 (calc) (ref <5.0)
Triglycerides: 73 mg/dL (ref <150)

## 2019-09-26 LAB — CBC
HCT: 38.2 % (ref 35.0–45.0)
Hemoglobin: 12.6 g/dL (ref 11.7–15.5)
MCH: 30.1 pg (ref 27.0–33.0)
MCHC: 33 g/dL (ref 32.0–36.0)
MCV: 91.2 fL (ref 80.0–100.0)
MPV: 10.2 fL (ref 7.5–12.5)
Platelets: 368 10*3/uL (ref 140–400)
RBC: 4.19 10*6/uL (ref 3.80–5.10)
RDW: 12.2 % (ref 11.0–15.0)
WBC: 9.7 10*3/uL (ref 3.8–10.8)

## 2019-10-26 DIAGNOSIS — H25813 Combined forms of age-related cataract, bilateral: Secondary | ICD-10-CM | POA: Diagnosis not present

## 2019-12-05 ENCOUNTER — Encounter: Payer: Medicare Other | Admitting: Family Medicine

## 2019-12-19 ENCOUNTER — Ambulatory Visit: Payer: Medicare Other | Admitting: Internal Medicine

## 2019-12-19 ENCOUNTER — Encounter: Payer: Medicare Other | Admitting: Family Medicine

## 2019-12-20 ENCOUNTER — Ambulatory Visit (INDEPENDENT_AMBULATORY_CARE_PROVIDER_SITE_OTHER): Payer: Medicare Other | Admitting: Family Medicine

## 2019-12-20 ENCOUNTER — Other Ambulatory Visit: Payer: Self-pay

## 2019-12-20 ENCOUNTER — Encounter: Payer: Self-pay | Admitting: Family Medicine

## 2019-12-20 VITALS — BP 130/84 | HR 81 | Temp 97.1°F | Resp 16 | Ht 64.0 in | Wt 245.0 lb

## 2019-12-20 DIAGNOSIS — Z1231 Encounter for screening mammogram for malignant neoplasm of breast: Secondary | ICD-10-CM

## 2019-12-20 DIAGNOSIS — Z0000 Encounter for general adult medical examination without abnormal findings: Secondary | ICD-10-CM

## 2019-12-20 DIAGNOSIS — Z853 Personal history of malignant neoplasm of breast: Secondary | ICD-10-CM

## 2019-12-20 NOTE — Assessment & Plan Note (Signed)

## 2019-12-20 NOTE — Patient Instructions (Addendum)
Wellness exam in 6 months, call if you need me before  You are referred for your mammogram and we will call with appointment information  You are referred for annual Oncology follow up  Congrats on excellent labs, improved health habits and weight loss  No changes in medication   It is important that you exercise regularly at least 30 minutes 5 times a week. If you develop chest pain, have severe difficulty breathing, or feel very tired, stop exercising immediately and seek medical attention  Thanks for choosing [facility], we consider it a privelige to serve you.   Thanks for choosing [facility], we consider it a privelige to serve you.

## 2019-12-21 ENCOUNTER — Other Ambulatory Visit (HOSPITAL_COMMUNITY): Payer: Medicare Other

## 2019-12-21 ENCOUNTER — Encounter: Payer: Self-pay | Admitting: Family Medicine

## 2019-12-21 NOTE — Progress Notes (Signed)
    Heather [name]     MRN: [id number]      DOB: 1941/06/12  HPI: Patient is in for annual physical exam. No other health concernsif available are reviewed. Immunization is reviewed , and  updated  PE: BP 130/84   Pulse 81   Temp (!) 97.1 F (36.2 C) (Temporal)   Resp 16   Ht 5\' 4"  (1.626 m)   Wt 245 lb (111.1 kg)   SpO2 97%   BMI 42.05 kg/m   Pleasant  female, alert and oriented x 3, in no cardio-pulmonary distress. Afebrile. HEENT No facial trauma or asymetry. Sinuses non tender.  Extra occullar muscles intact.. External ears normal, . Neck: supple, no adenopathy,JVD or thyromegaly.No bruits.  Chest: Clear to ascultation bilaterally.No crackles or wheezes. Non tender to palpation  Breast: Left mastectomy, Right breast no masses or lumps. No tenderness. No nipple discharge or inversion. No axillary or supraclavicular adenopathy  Cardiovascular system; Heart sounds normal,  S1 and  S2 ,no S3.  No murmur, or thrill. Apical beat not displaced Peripheral pulses normal.  Abdomen: Soft, non tender, no organomegaly or masses. No bruits. Bowel sounds normal. No guarding, tenderness or rebound.  :  Musculoskeletal exam: Adequate  ROM of spine, hips , shoulders and knees.  deformity ,swelling and  crepitus noted.in knees No muscle wasting or atrophy.   Neurologic: Cranial nerves 2 to 12 intact. Power, tone ,sensation and reflexes normal throughout. No disturbance in gait. No tremor.  Skin: Intact, no ulceration, erythema , scaling or rash noted. Pigmentation normal throughout  Psych; Normal mood and affect. Judgement and concentration normal   Assessment & Plan:  Annual physical exam Annual exam as documented. Counseling done  re healthy lifestyle involving commitment to 150 minutes exercise per week, heart healthy diet, and attaining healthy weight.The importance of adequate sleep also discussed. Regular seat belt use and home safety, is also  discussed. Changes in health habits are decided on by the patient with goals and time frames  set for achieving them. Immunization and cancer screening needs are specifically addressed at this visit.

## 2019-12-25 ENCOUNTER — Telehealth (INDEPENDENT_AMBULATORY_CARE_PROVIDER_SITE_OTHER): Payer: Medicare Other

## 2019-12-25 ENCOUNTER — Other Ambulatory Visit: Payer: Self-pay

## 2019-12-25 VITALS — BP 130/84 | Ht 64.0 in | Wt 245.0 lb

## 2019-12-25 DIAGNOSIS — Z0000 Encounter for general adult medical examination without abnormal findings: Secondary | ICD-10-CM

## 2019-12-25 NOTE — Patient Instructions (Signed)
Heather [name] , Thank you for taking time to come for your Medicare Wellness Visit. I appreciate your ongoing commitment to your health goals. Please review the following plan we discussed and let me know if I can assist you in the future.   Screening recommendations/referrals: Colonoscopy: up to date Mammogram: up to date Bone Density: up to date  Recommended yearly ophthalmology/optometry visit for glaucoma screening and checkup Recommended yearly dental visit for hygiene and checkup  Vaccinations: Influenza vaccine: up to date  Pneumococcal vaccine: up to date  Tdap vaccine: due- will check with the pharmacy  Shingles vaccine: qualifies- will check with the pharmacy    Next appointment: wellness in 1 year   Preventive Care 65 Years and Older, Female Preventive care refers to lifestyle choices and visits with your health care provider that can promote health and wellness. What does preventive care include?  A yearly physical exam. This is also called an annual well check.  Dental exams once or twice a year.  Routine eye exams. Ask your health care provider how often you should have your eyes checked.  Personal lifestyle choices, including:  Daily care of your teeth and gums.  Regular physical activity.  Eating a healthy diet.  Avoiding tobacco and drug use.  Limiting alcohol use.  Practicing safe sex.  Taking low-dose aspirin every day.  Taking vitamin and mineral supplements as recommended by your health care provider. What happens during an annual well check? The services and screenings done by your health care provider during your annual well check will depend on your age, overall health, lifestyle risk factors, and family history of disease. Counseling  Your health care provider may ask you questions about your:  Alcohol use.  Tobacco use.  Drug use.  Emotional well-being.  Home and relationship well-being.  Sexual activity.  Eating  habits.  History of falls.  Memory and ability to understand (cognition).  Work and work Statistician.  Reproductive health. Screening  You may have the following tests or measurements:  Height, weight, and BMI.  Blood pressure.  Lipid and cholesterol levels. These may be checked every 5 years, or more frequently if you are over 48 years old.  Skin check.  Lung cancer screening. You may have this screening every year starting at age 89 if you have a 30-pack-year history of smoking and currently smoke or have quit within the past 15 years.  Fecal occult blood test (FOBT) of the stool. You may have this test every year starting at age 63.  Flexible sigmoidoscopy or colonoscopy. You may have a sigmoidoscopy every 5 years or a colonoscopy every 10 years starting at age 21.  Hepatitis C blood test.  Hepatitis B blood test.  Sexually transmitted disease (STD) testing.  Diabetes screening. This is done by checking your blood sugar (glucose) after you have not eaten for a while (fasting). You may have this done every 1-3 years.  Bone density scan. This is done to screen for osteoporosis. You may have this done starting at age 41.  Mammogram. This may be done every 1-2 years. Talk to your health care provider about how often you should have regular mammograms. Talk with your health care provider about your test results, treatment options, and if necessary, the need for more tests. Vaccines  Your health care provider may recommend certain vaccines, such as:  Influenza vaccine. This is recommended every year.  Tetanus, diphtheria, and acellular pertussis (Tdap, Td) vaccine. You may need a Td booster every  10 years.  Zoster vaccine. You may need this after age 11.  Pneumococcal 13-valent conjugate (PCV13) vaccine. One dose is recommended after age 106.  Pneumococcal polysaccharide (PPSV23) vaccine. One dose is recommended after age 69. Talk to your health care provider about which  screenings and vaccines you need and how often you need them. This information is not intended to replace advice given to you by your health care provider. Make sure you discuss any questions you have with your health care provider. Document Released: 08/02/2015 Document Revised: 03/25/2016 Document Reviewed: 05/07/2015 Elsevier Interactive Patient Education  2017 [place] Prevention in the Home Falls can cause injuries. They can happen to people of all ages. There are many things you can do to make your home safe and to help prevent falls. What can I do on the outside of my home?  Regularly fix the edges of walkways and driveways and fix any cracks.  Remove anything that might make you trip as you walk through a door, such as a raised step or threshold.  Trim any bushes or trees on the path to your home.  Use bright outdoor lighting.  Clear any walking paths of anything that might make someone trip, such as rocks or tools.  Regularly check to see if handrails are loose or broken. Make sure that both sides of any steps have handrails.  Any raised decks and porches should have guardrails on the edges.  Have any leaves, snow, or ice cleared regularly.  Use sand or salt on walking paths during winter.  Clean up any spills in your garage right away. This includes oil or grease spills. What can I do in the bathroom?  Use night lights.  Install grab bars by the toilet and in the tub and shower. Do not use towel bars as grab bars.  Use non-skid mats or decals in the tub or shower.  If you need to sit down in the shower, use a plastic, non-slip stool.  Keep the floor dry. Clean up any water that spills on the floor as soon as it happens.  Remove soap buildup in the tub or shower regularly.  Attach bath mats securely with double-sided non-slip rug tape.  Do not have throw rugs and other things on the floor that can make you trip. What can I do in the bedroom?  Use  night lights.  Make sure that you have a light by your bed that is easy to reach.  Do not use any sheets or blankets that are too big for your bed. They should not hang down onto the floor.  Have a firm chair that has side arms. You can use this for support while you get dressed.  Do not have throw rugs and other things on the floor that can make you trip. What can I do in the kitchen?  Clean up any spills right away.  Avoid walking on wet floors.  Keep items that you use a lot in easy-to-reach places.  If you need to reach something above you, use a strong step stool that has a grab bar.  Keep electrical cords out of the way.  Do not use floor polish or wax that makes floors slippery. If you must use wax, use non-skid floor wax.  Do not have throw rugs and other things on the floor that can make you trip. What can I do with my stairs?  Do not leave any items on the stairs.  Make sure  that there are handrails on both sides of the stairs and use them. Fix handrails that are broken or loose. Make sure that handrails are as long as the stairways.  Check any carpeting to make sure that it is firmly attached to the stairs. Fix any carpet that is loose or worn.  Avoid having throw rugs at the top or bottom of the stairs. If you do have throw rugs, attach them to the floor with carpet tape.  Make sure that you have a light switch at the top of the stairs and the bottom of the stairs. If you do not have them, ask someone to add them for you. What else can I do to help prevent falls?  Wear shoes that:  Do not have high heels.  Have rubber bottoms.  Are comfortable and fit you well.  Are closed at the toe. Do not wear sandals.  If you use a stepladder:  Make sure that it is fully opened. Do not climb a closed stepladder.  Make sure that both sides of the stepladder are locked into place.  Ask someone to hold it for you, if possible.  Clearly mark and make sure that you  can see:  Any grab bars or handrails.  First and last steps.  Where the edge of each step is.  Use tools that help you move around (mobility aids) if they are needed. These include:  Canes.  Walkers.  Scooters.  Crutches.  Turn on the lights when you go into a dark area. Replace any light bulbs as soon as they burn out.  Set up your furniture so you have a clear path. Avoid moving your furniture around.  If any of your floors are uneven, fix them.  If there are any pets around you, be aware of where they are.  Review your medicines with your doctor. Some medicines can make you feel dizzy. This can increase your chance of falling. Ask your doctor what other things that you can do to help prevent falls. This information is not intended to replace advice given to you by your health care provider. Make sure you discuss any questions you have with your health care provider. Document Released: 05/02/2009 Document Revised: 12/12/2015 Document Reviewed: 08/10/2014 Elsevier Interactive Patient Education  2017 [employer].

## 2019-12-25 NOTE — Progress Notes (Signed)
Subjective:   Heather [name] is a 79 y.o. female who presents for Medicare Annual (Subsequent) preventive examination.  Review of Systems:   Cardiac Risk Factors include: advanced age (>70men, >24 women);dyslipidemia;hypertension;obesity (BMI >30kg/m2);smoking/ tobacco exposure     Objective:     Vitals: BP 130/84   Ht 5\' 4"  (1.626 m)   Wt 245 lb (111.1 kg)   BMI 42.05 kg/m   Body mass index is 42.05 kg/m.  Advanced Directives 12/05/2018 02/17/2018 11/17/2017 11/13/2016 10/05/2016 01/19/2016 11/12/2015  Does Patient Have a Medical Advance Directive? Yes Yes Yes Yes Yes No Yes  Type of Advance Directive Living will;Healthcare Power of [place];Living will Living will Living will - Living will  Does patient want to make changes to medical advance directive? No - Patient declined Yes (ED - Information included in AVS) No - Patient declined - No - Patient declined - -  Copy of [place] in Chart? No - copy requested - No - copy requested - - - No - copy requested  Would patient like information on creating a medical advance directive? - - - - Yes (MAU/Ambulatory/Procedural Areas - Information given) - -    Tobacco Social History   Tobacco Use  Smoking Status Former Smoker  . Years: 40.00  . Types: Cigarettes  . Quit date: 07/19/1998  . Years since quitting: 21.4  Smokeless Tobacco Never Used     Counseling given: Not Answered   Clinical Intake:  Pre-visit preparation completed: Yes  Pain : No/denies pain     Nutritional Status: BMI > 30  Obese Diabetes: No  How often do you need to have someone help you when you read instructions, pamphlets, or other written materials from your doctor or pharmacy?: 1 - Never What is the last grade level you completed in school?: college  Interpreter Needed?: No     Past Medical History:  Diagnosis Date  . Annual physical exam 05/30/2014  . Asthma   . Breast cancer ([place]) 1998   left  . Congestive heart failure ([place])   . FATIGUE 09/20/2008   Qualifier: Diagnosis of  By: Moshe Cipro MD, Joycelyn Schmid    . FH: CAD (coronary artery disease)   . Gallstones 05/30/2014  . GERD (gastroesophageal reflux disease)   . Heart failure   . Hypertension 1998  . 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 Personal history of malignant neoplasm of breast    Past Surgical History:  Procedure Laterality Date  . ABDOMINAL HYSTERECTOMY  1973   fibroids  . BREAST SURGERY Left 1998   breast ca  . FLEXIBLE SIGMOIDOSCOPY N/A 05/25/2016   Procedure: FLEXIBLE SIGMOIDOSCOPY;  Surgeon: Danie Binder, MD;  Location[facility];  Service: Endoscopy;  Laterality: N/A;  2:00 AM  . MASTECTOMY  1997   left breast   . TOTAL ABDOMINAL HYSTERECTOMY W/ BILATERAL SALPINGOOPHORECTOMY  1971   unilateral   . 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 Parkinsonism Mother   . Prostate cancer Father   . Kidney disease Sister        nephrectomy  . Cancer Brother        throat  . Coronary artery disease Other        Family History of males   . Breast  cancer Sister   . Lung cancer Sister   . Breast cancer Sister   . Cervical cancer Sister    Social History   Socioeconomic History  . Marital status: Widowed    Spouse name: Not on file  . Number of children: 6  . Years of education: Not on file  . Highest education level: Some college, no degree  Occupational History  . Occupation: disabled in [place] Use  . Smoking status: Former Smoker    Years: 40.00    Types: Cigarettes    Quit date: 07/19/1998    Years since quitting: 21.4  . Smokeless tobacco: Never Used  Substance and Sexual Activity  . Alcohol use: No  . Drug use: No  . Sexual activity: Not Currently  Other Topics Concern  . Not on file  Social History Narrative  . Not on file    Social Determinants of Health   Financial Resource Strain:   . Difficulty of Paying Living Expenses:   Food Insecurity:   . Worried About Charity fundraiser in the Last Year:   . Arboriculturist in the Last Year:   Transportation Needs:   . Film/video editor (Medical):   Marland Kitchen Lack of Transportation (Non-Medical):   Physical Activity:   . Days of Exercise per Week:   . Minutes of Exercise per Session:   Stress:   . Feeling of Stress :   Social Connections:   . Frequency of Communication with Friends and Family:   . Frequency of Social Gatherings with Friends and Family:   . Attends Religious Services:   . Active Member of Clubs or Organizations:   . Attends Archivist Meetings:   Marland Kitchen Marital Status:     Outpatient Encounter Medications as of 12/25/2019  Medication Sig  . albuterol (VENTOLIN HFA) 108 (90 Base) MCG/ACT inhaler Inhale 2 puffs into the lungs every 6 (six) hours as needed for wheezing or shortness of breath.  . cholecalciferol (VITAMIN D) 400 UNITS TABS tablet Take 400 Units by mouth daily.  . fluticasone furoate-vilanterol (BREO ELLIPTA) 100-25 MCG/INH AEPB Inhale into the lungs.  Marland Kitchen losartan-hydrochlorothiazide (HYZAAR) 100-12.5 MG tablet Take 1 tablet by mouth daily.  . montelukast (SINGULAIR) 10 MG tablet Take 1 tablet (10 mg total) by mouth at bedtime.  . potassium chloride SA (K-DUR) 20 MEQ tablet TAKE ONE TABLET BY MOUTH TWICE A DAY   No facility-administered encounter medications on file as of 12/25/2019.    Activities of Daily Living In your present state of health, do you have any difficulty performing the following activities: 12/25/2019 12/20/2019  Hearing? N N  Vision? N N  Difficulty concentrating or making decisions? N N  Walking or climbing stairs? Y N  Dressing or bathing? N N  Doing errands, shopping? N N  Preparing Food and eating ? N -  Using the Toilet? N -  In the past six months, have you accidently leaked urine? N -  Do you have  problems with loss of bowel control? N -  Managing your Medications? N -  Managing your Finances? N -  Housekeeping or managing your Housekeeping? N -  Some recent data might be hidden    Patient Care Team: Fayrene Helper, MD as PCP - General [place], Manon Hilding, PA-C as Physician Assistant (Oncology)    Assessment:   This is a routine wellness examination for [facility].  Exercise Activities and Dietary recommendations Current Exercise Habits: The  patient does not participate in regular exercise at present;Home exercise routine, Type of exercise: stretching, Time (Minutes): 20, Frequency (Times/Week): 4, Weekly Exercise (Minutes/Week): 80, Intensity: Mild, Exercise limited by: respiratory conditions(s)  Goals    . Increase physical activity     Start walking 5 days a week for 30 minutes, 5 days a week     . Increase water intake     Recommend increasing water intake to 8, 8 ounce glasses a day (64 ounces).    Marland Kitchen LIFESTYLE - DECREASE FALLS RISK       Fall Risk Fall Risk  12/25/2019 12/20/2019 09/20/2019 08/30/2019 05/23/2019  Falls in the past year? 0 0 0 0 0  Number falls in past yr: 0 0 0 0 0  Injury with Fall? 0 0 0 0 0   Is the patient's home free of loose throw rugs in walkways, pet beds, electrical cords, etc?   yes      Grab bars in the bathroom? no      Handrails on the stairs?   yes      Adequate lighting?   yes  Timed Get Up and Go performed: unable due to virtual visit   Depression Screen PHQ 2/9 Scores 12/25/2019 09/20/2019 08/30/2019 05/23/2019  PHQ - 2 Score 0 0 0 0  PHQ- 9 Score - - - -     Cognitive Function     6CIT Screen 12/25/2019 02/17/2018 02/17/2018 10/05/2016  What Year? - 0 points 0 points 0 points  What month? - 0 points 0 points 0 points  What time? 0 points 0 points 0 points 0 points  Count back from 20 0 points 0 points - 0 points  Months in reverse 0 points 0 points - 0 points  Repeat phrase 0 points 0 points - 0 points  Total Score - 0 - 0     Immunization History  Administered Date(s) Administered  . Fluad Quad(high Dose 65+) 05/23/2019  . Influenza Whole 05/06/2006, 05/25/2007, 05/06/2009, 03/27/2010  . Influenza, High Dose Seasonal PF 08/09/2018  . Influenza,inj,Quad PF,6+ Mos 04/12/2013, 05/30/2014, 05/09/2015, 05/28/2016  . Moderna SARS-COVID-2 Vaccination 09/27/2019, 10/25/2019  . Pneumococcal Conjugate-13 11/11/2017  . Pneumococcal Polysaccharide-23 02/14/2007, 01/30/2016  . Td 02/14/2007    Qualifies for Shingles Vaccine? qualifies  Screening Tests Health Maintenance  Topic Date Due  . TETANUS/TDAP  02/13/2017  . INFLUENZA VACCINE  02/18/2020  . DEXA SCAN  Completed  . COVID-19 Vaccine  Completed  . PNA vac Low Risk Adult  Completed    Cancer Screenings: Lung: Low Dose CT Chest recommended if Age 63-80 years, 30 pack-year currently smoking OR have quit w/in 15years. Patient does not qualify. Breast:  Up to date on Mammogram? Yes   Up to date of Bone Density/Dexa? Yes Colorectal: up to date   Additional Screenings:   Hepatitis C Screening: needed once     Plan:     I have personally reviewed and noted the following in the patient's chart:   . Medical and social history . Use of alcohol, tobacco or illicit drugs  . Current medications and supplements . Functional ability and status . Nutritional status . Physical activity . Advanced directives . List of other physicians . Hospitalizations, surgeries, and ER visits in previous 12 months . Vitals . Screenings to include cognitive, depression, and falls . Referrals and appointments  In addition, I have reviewed and discussed with patient certain preventive protocols, quality metrics, and best practice recommendations. A written personalized  care plan for preventive services as well as general preventive health recommendations were provided to patient.     Kate Sable, LPN, LPN  3/0/1720

## 2019-12-26 ENCOUNTER — Inpatient Hospital Stay (HOSPITAL_COMMUNITY): Payer: Medicare Other | Attending: Hematology

## 2019-12-27 ENCOUNTER — Other Ambulatory Visit (HOSPITAL_COMMUNITY): Payer: Self-pay | Admitting: Family Medicine

## 2019-12-27 ENCOUNTER — Telehealth (HOSPITAL_COMMUNITY): Payer: Medicare Other | Admitting: Nurse Practitioner

## 2019-12-27 DIAGNOSIS — Z1231 Encounter for screening mammogram for malignant neoplasm of breast: Secondary | ICD-10-CM

## 2020-01-09 ENCOUNTER — Other Ambulatory Visit (HOSPITAL_COMMUNITY): Payer: Self-pay | Admitting: *Deleted

## 2020-01-09 DIAGNOSIS — Z13820 Encounter for screening for osteoporosis: Secondary | ICD-10-CM

## 2020-01-09 DIAGNOSIS — C50912 Malignant neoplasm of unspecified site of left female breast: Secondary | ICD-10-CM

## 2020-01-10 ENCOUNTER — Inpatient Hospital Stay (HOSPITAL_COMMUNITY): Payer: Medicare Other | Attending: Hematology

## 2020-01-10 ENCOUNTER — Ambulatory Visit (HOSPITAL_COMMUNITY): Payer: Medicare Other

## 2020-01-18 ENCOUNTER — Telehealth (HOSPITAL_COMMUNITY): Payer: Medicare Other | Admitting: Nurse Practitioner

## 2020-05-10 ENCOUNTER — Other Ambulatory Visit: Payer: Self-pay | Admitting: Family Medicine

## 2020-10-02 ENCOUNTER — Telehealth: Payer: Medicare Other | Admitting: Internal Medicine

## 2020-10-03 ENCOUNTER — Telehealth: Payer: Medicare Other | Admitting: Internal Medicine

## 2020-10-04 ENCOUNTER — Ambulatory Visit: Payer: Medicare Other | Admitting: Nurse Practitioner

## 2020-10-04 ENCOUNTER — Other Ambulatory Visit: Payer: Self-pay

## 2020-10-04 ENCOUNTER — Ambulatory Visit: Payer: Medicare Other | Admitting: Internal Medicine

## 2020-10-23 ENCOUNTER — Ambulatory Visit (HOSPITAL_COMMUNITY): Payer: Medicare Other

## 2020-10-23 ENCOUNTER — Ambulatory Visit (HOSPITAL_COMMUNITY)
Admission: RE | Admit: 2020-10-23 | Discharge: 2020-10-23 | Disposition: A | Discharge status: Home / Self Care | Payer: Medicare Other | Source: Ambulatory Visit | Attending: Family Medicine | Admitting: Family Medicine

## 2020-10-23 DIAGNOSIS — Z1231 Encounter for screening mammogram for malignant neoplasm of breast: Secondary | ICD-10-CM | POA: Diagnosis not present

## 2020-10-27 ENCOUNTER — Telehealth: Payer: Self-pay | Admitting: Family Medicine

## 2020-10-27 NOTE — Telephone Encounter (Signed)
Pls schedule ap[pt for annual exam in office with me in June when due

## 2020-10-28 NOTE — Telephone Encounter (Signed)
Appt made for 12-25-20, lvm for the pt to call me if this time and date did not work

## 2020-12-25 ENCOUNTER — Encounter: Payer: Medicare Other | Admitting: Family Medicine

## 2020-12-26 ENCOUNTER — Encounter: Payer: Medicare Other | Admitting: Nurse Practitioner

## 2021-01-07 ENCOUNTER — Encounter: Payer: Self-pay | Admitting: Family Medicine

## 2021-01-07 ENCOUNTER — Ambulatory Visit (INDEPENDENT_AMBULATORY_CARE_PROVIDER_SITE_OTHER): Payer: Medicare Other | Admitting: Family Medicine

## 2021-01-07 ENCOUNTER — Other Ambulatory Visit: Payer: Self-pay

## 2021-01-07 VITALS — BP 150/100 | HR 80 | Temp 98.8°F | Resp 18 | Ht 64.0 in | Wt 247.0 lb

## 2021-01-07 DIAGNOSIS — Z0000 Encounter for general adult medical examination without abnormal findings: Secondary | ICD-10-CM

## 2021-01-07 DIAGNOSIS — I10 Essential (primary) hypertension: Secondary | ICD-10-CM

## 2021-01-07 DIAGNOSIS — R5383 Other fatigue: Secondary | ICD-10-CM | POA: Diagnosis not present

## 2021-01-07 DIAGNOSIS — Z1159 Encounter for screening for other viral diseases: Secondary | ICD-10-CM

## 2021-01-07 DIAGNOSIS — R7303 Prediabetes: Secondary | ICD-10-CM | POA: Diagnosis not present

## 2021-01-07 DIAGNOSIS — E785 Hyperlipidemia, unspecified: Secondary | ICD-10-CM | POA: Diagnosis not present

## 2021-01-07 DIAGNOSIS — Z8679 Personal history of other diseases of the circulatory system: Secondary | ICD-10-CM

## 2021-01-07 MED ORDER — AMLODIPINE BESYLATE 5 MG PO TABS: 5.0000 mg | ORAL_TABLET | Freq: Every day | ORAL | 3 refills | Status: DC

## 2021-01-07 NOTE — Patient Instructions (Addendum)
F/U end August, re eval BP , and flu vaccine  New additional medication for BP is amlodipine 5 mg one daily, continue the hyzaar   Labs today, CBC, lipid, cmp and eGFr, TSH, Vit d and HBA1C and hepatitis C screen   It is important that you exercise regularly at least 30 minutes 5 times a week. If you develop chest pain, have severe difficulty breathing, or feel very tired, stop exercising immediately and seek medical attention    Think about what you will eat, plan ahead. Choose " clean, green, fresh or frozen" over canned, processed or packaged foods which are more sugary, salty and fatty. 70 to 75% of food eaten should be vegetables and fruit. Three meals at set times with snacks allowed between meals, but they must be fruit or vegetables. Aim to eat over a 12 hour period , example 7 am to 7 pm, and STOP after  your last meal of the day. Drink water,generally about 64 ounces per day, no other drink is as healthy. Fruit juice is best enjoyed in a healthy way, by EATING the fruit.  Thanks for choosing [facility], we consider it a privelige to serve you.

## 2021-01-07 NOTE — Progress Notes (Signed)
Heather [name]     MRN: [id number]      DOB: 03-30-1941  HPI: Patient is in for annual physical exam. Uncontrolled hypertension is addressed Recent labs,  are reviewed.after the visit, since they are drawn on the day of the visit Immunization is reviewed , and  is up to date   PE: BP (!) 150/100   Pulse 80   Temp 98.8 F (37.1 C)   Resp 18   Ht 5\' 4"  (1.626 m)   Wt 247 lb (112 kg)   SpO2 93%   BMI 42.40 kg/m   Pleasant  female, alert and oriented x 3, in no cardio-pulmonary distress. Afebrile. HEENT No facial trauma or asymetry. Sinuses non tender.  Extra occullar muscles intact.. External ears normal, . Neck: supple, no adenopathy,JVD or thyromegaly.No bruits.  Chest: Clear to ascultation bilaterally.No crackles or wheezes. Non tender to palpation  Cardiovascular system; Heart sounds normal,  S1 and  S2 ,no S3.  No murmur, or thrill. Apical beat not displaced Peripheral pulses normal.  Abdomen: Soft, non tender, No guarding, tenderness or rebound.     Musculoskeletal exam: Decreased though adequate ROM of spine, hips , shoulders and knees.  deformity ,swelling and  crepitus noted. No muscle wasting or atrophy.   Neurologic: Cranial nerves 2 to 12 intact. Power, tone ,sensation and reflexes normal throughout.  disturbance in gait. No tremor.  Skin: Intact, no ulceration, erythema , scaling or rash noted. Pigmentation normal throughout  Psych; Normal mood and affect. Judgement and concentration normal   Assessment & Plan:  Annual physical exam Annual exam as documented. Counseling done  re healthy lifestyle involving commitment to 150 minutes exercise per week, heart healthy diet, and attaining healthy weight.The importance of adequate sleep also discussed. Regular seat belt use and home safety, is also discussed. Changes in health habits are decided on by the patient with goals and time frames  set for achieving them. Immunization and cancer  screening needs are specifically addressed at this visit.   Hypertension Uncontrolled , add amlodipine 5 mg daily DASH diet and commitment to daily physical activity for a minimum of 30 minutes discussed and encouraged, as a part of hypertension management. The importance of attaining a healthy weight is also discussed.  BP/Weight 01/07/2021 12/25/2019 12/20/2019 09/20/2019 08/30/2019 05/23/2019 12/31/4313  Systolic BP 400 867 619 509 326 712 458  Diastolic BP 099 84 84 98 833 88 70  Wt. (Lbs) 247 245 245 249 250.08 249.08 245.9  BMI 42.4 42.05 42.05 41.44 42.93 42.75 42.21       MORBID OBESITY  Patient re-educated about  the importance of commitment to a  minimum of 150 minutes of exercise per week as able.  The importance of healthy food choices with portion control discussed, as well as eating regularly and within a 12 hour window most days. The need to choose "clean , green" food 50 to 75% of the time is discussed, as well as to make water the primary drink and set a goal of 64 ounces water daily.    Weight /BMI 01/07/2021 12/25/2019 12/20/2019  WEIGHT 247 lb 245 lb 245 lb  HEIGHT 5\' 4"  5\' 4"  5\' 4"   BMI 42.4 kg/m2 42.05 kg/m2 42.05 kg/m2      Prediabetes Patient educated about the importance of limiting  Carbohydrate intake , the need to commit to daily physical activity for a minimum of 30 minutes , and to commit weight loss. The fact that changes in  all these areas will reduce or eliminate all together the development of diabetes is stressed.  Excellent, normalized  Diabetic Labs Latest Ref Rng & Units 01/07/2021 09/26/2019 12/05/2018 08/09/2018 11/25/2017  HbA1c 4.8 - 5.6 % 5.6 - - - -  Chol 100 - 199 mg/dL 193 182 - 197 185  HDL >39 mg/dL 50 45(L) - 39(L) 42(L)  Calc LDL 0 - 99 mg/dL 130(H) 120(H) - 136(H) 123(H)  Triglycerides 0 - 149 mg/dL 69 73 - 114 92  Creatinine 0.57 - 1.00 mg/dL 0.96 0.93 1.01(H) 1.03(H) 0.88   BP/Weight 01/07/2021 12/25/2019 12/20/2019 09/20/2019 08/30/2019 05/23/2019  08/07/1476  Systolic BP 295 621 308 657 846 962 952  Diastolic BP 841 84 84 98 324 88 70  Wt. (Lbs) 247 245 245 249 250.08 249.08 245.9  BMI 42.4 42.05 42.05 41.44 42.93 42.75 42.21   No flowsheet data found.    Dyslipidemia Hyperlipidemia:Low fat diet discussed and encouraged.   Lipid Panel  Lab Results  Component Value Date   CHOL 193 01/07/2021   HDL 50 01/07/2021   LDLCALC 130 (H) 01/07/2021   TRIG 69 01/07/2021   CHOLHDL 3.9 01/07/2021   Needs to lower fat intake

## 2021-01-08 LAB — TSH: TSH: 3.71 u[IU]/mL (ref 0.450–4.500)

## 2021-01-08 LAB — CBC WITH DIFFERENTIAL/PLATELET
Basophils Absolute: 0 10*3/uL (ref 0.0–0.2)
Basos: 0 %
EOS (ABSOLUTE): 0.1 10*3/uL (ref 0.0–0.4)
Eos: 1 %
Hematocrit: 41 % (ref 34.0–46.6)
Hemoglobin: 13.1 g/dL (ref 11.1–15.9)
Immature Grans (Abs): 0 10*3/uL (ref 0.0–0.1)
Immature Granulocytes: 0 %
Lymphocytes Absolute: 2.5 10*3/uL (ref 0.7–3.1)
Lymphs: 29 %
MCH: 29.3 pg (ref 26.6–33.0)
MCHC: 32 g/dL (ref 31.5–35.7)
MCV: 92 fL (ref 79–97)
Monocytes Absolute: 0.6 10*3/uL (ref 0.1–0.9)
Monocytes: 7 %
Neutrophils Absolute: 5.4 10*3/uL (ref 1.4–7.0)
Neutrophils: 63 %
Platelets: 378 10*3/uL (ref 150–450)
RBC: 4.47 x10E6/uL (ref 3.77–5.28)
RDW: 12.3 % (ref 11.7–15.4)
WBC: 8.6 10*3/uL (ref 3.4–10.8)

## 2021-01-08 LAB — HEMOGLOBIN A1C
Est. average glucose Bld gHb Est-mCnc: 114 mg/dL
Hgb A1c MFr Bld: 5.6 % (ref 4.8–5.6)

## 2021-01-08 LAB — CMP14+EGFR
ALT: 9 IU/L (ref 0–32)
AST: 11 IU/L (ref 0–40)
Albumin/Globulin Ratio: 1.2 (ref 1.2–2.2)
Albumin: 4.1 g/dL (ref 3.7–4.7)
Alkaline Phosphatase: 92 IU/L (ref 44–121)
BUN/Creatinine Ratio: 16 (ref 12–28)
BUN: 15 mg/dL (ref 8–27)
Bilirubin Total: 0.4 mg/dL (ref 0.0–1.2)
CO2: 24 mmol/L (ref 20–29)
Calcium: 9.4 mg/dL (ref 8.7–10.3)
Chloride: 100 mmol/L (ref 96–106)
Creatinine, Ser: 0.96 mg/dL (ref 0.57–1.00)
Globulin, Total: 3.5 g/dL (ref 1.5–4.5)
Glucose: 89 mg/dL (ref 65–99)
Potassium: 4.7 mmol/L (ref 3.5–5.2)
Sodium: 140 mmol/L (ref 134–144)
Total Protein: 7.6 g/dL (ref 6.0–8.5)
eGFR: 60 mL/min/{1.73_m2} (ref >59)

## 2021-01-08 LAB — LIPID PANEL
Chol/HDL Ratio: 3.9 ratio (ref 0.0–4.4)
Cholesterol, Total: 193 mg/dL (ref 100–199)
HDL: 50 mg/dL (ref >39)
LDL Chol Calc (NIH): 130 mg/dL — ABNORMAL HIGH (ref 0–99)
Triglycerides: 69 mg/dL (ref 0–149)
VLDL Cholesterol Cal: 13 mg/dL (ref 5–40)

## 2021-01-08 LAB — HEPATITIS C ANTIBODY: Hep C Virus Ab: <0.1 s/co ratio (ref 0.0–0.9)

## 2021-01-11 ENCOUNTER — Encounter: Payer: Self-pay | Admitting: Family Medicine

## 2021-01-11 NOTE — Assessment & Plan Note (Signed)
  Patient re-educated about  the importance of commitment to a  minimum of 150 minutes of exercise per week as able.  The importance of healthy food choices with portion control discussed, as well as eating regularly and within a 12 hour window most days. The need to choose "clean , green" food 50 to 75% of the time is discussed, as well as to make water the primary drink and set a goal of 64 ounces water daily.    Weight /BMI 01/07/2021 12/25/2019 12/20/2019  WEIGHT 247 lb 245 lb 245 lb  HEIGHT 5\' 4"  5\' 4"  5\' 4"   BMI 42.4 kg/m2 42.05 kg/m2 42.05 kg/m2

## 2021-01-11 NOTE — Assessment & Plan Note (Signed)
Hyperlipidemia:Low fat diet discussed and encouraged.   Lipid Panel  Lab Results  Component Value Date   CHOL 193 01/07/2021   HDL 50 01/07/2021   LDLCALC 130 (H) 01/07/2021   TRIG 69 01/07/2021   CHOLHDL 3.9 01/07/2021   Needs to lower fat intake

## 2021-01-11 NOTE — Assessment & Plan Note (Signed)
Uncontrolled , add amlodipine 5 mg daily DASH diet and commitment to daily physical activity for a minimum of 30 minutes discussed and encouraged, as a part of hypertension management. The importance of attaining a healthy weight is also discussed.  BP/Weight 01/07/2021 12/25/2019 12/20/2019 09/20/2019 08/30/2019 05/23/2019 0/76/2263  Systolic BP 335 456 256 389 373 428 768  Diastolic BP 115 84 84 98 726 88 70  Wt. (Lbs) 247 245 245 249 250.08 249.08 245.9  BMI 42.4 42.05 42.05 41.44 42.93 42.75 42.21

## 2021-01-11 NOTE — Assessment & Plan Note (Signed)

## 2021-01-11 NOTE — Assessment & Plan Note (Signed)
Patient educated about the importance of limiting  Carbohydrate intake , the need to commit to daily physical activity for a minimum of 30 minutes , and to commit weight loss. The fact that changes in all these areas will reduce or eliminate all together the development of diabetes is stressed.  Excellent, normalized  Diabetic Labs Latest Ref Rng & Units 01/07/2021 09/26/2019 12/05/2018 08/09/2018 11/25/2017  HbA1c 4.8 - 5.6 % 5.6 - - - -  Chol 100 - 199 mg/dL 193 182 - 197 185  HDL >39 mg/dL 50 45(L) - 39(L) 42(L)  Calc LDL 0 - 99 mg/dL 130(H) 120(H) - 136(H) 123(H)  Triglycerides 0 - 149 mg/dL 69 73 - 114 92  Creatinine 0.57 - 1.00 mg/dL 0.96 0.93 1.01(H) 1.03(H) 0.88   BP/Weight 01/07/2021 12/25/2019 12/20/2019 09/20/2019 08/30/2019 05/23/2019 08/05/4351  Systolic BP 912 258 346 219 471 252 712  Diastolic BP 929 84 84 98 090 88 70  Wt. (Lbs) 247 245 245 249 250.08 249.08 245.9  BMI 42.4 42.05 42.05 41.44 42.93 42.75 42.21   No flowsheet data found.

## 2021-03-04 ENCOUNTER — Other Ambulatory Visit: Payer: Self-pay

## 2021-03-04 ENCOUNTER — Ambulatory Visit (INDEPENDENT_AMBULATORY_CARE_PROVIDER_SITE_OTHER): Payer: Medicare Other

## 2021-03-04 DIAGNOSIS — Z0000 Encounter for general adult medical examination without abnormal findings: Secondary | ICD-10-CM | POA: Diagnosis not present

## 2021-03-04 NOTE — Progress Notes (Signed)
Subjective:   Heather [name] is a 80 y.o. female who presents for Medicare Annual (Subsequent) preventive examination. I connected with  Heather [name] on 03/04/21 by a audio enabled telemedicine application and verified that I am speaking with the correct person using two identifiers.   I discussed the limitations of evaluation and management by telemedicine. The patient expressed understanding and agreed to proceed.   Review of Systems    Defer to PCP       Objective:    Today's Vitals   03/04/21 1502  PainSc: 0-No pain   There is no height or weight on file to calculate BMI.  Advanced Directives 03/04/2021 12/05/2018 02/17/2018 11/17/2017 11/13/2016 10/05/2016 01/19/2016  Does Patient Have a Medical Advance Directive? Yes Yes Yes Yes Yes Yes No  Type of Advance Directive Living will Living will;Healthcare Power of [place];Living will Living will Living will -  Does patient want to make changes to medical advance directive? - No - Patient declined Yes (ED - Information included in AVS) No - Patient declined - No - Patient declined -  Copy of [place] in Chart? - No - copy requested - No - copy requested - - -  Would patient like information on creating a medical advance directive? - - - - - Yes (MAU/Ambulatory/Procedural Areas - Information given) -    Current Medications (verified) Outpatient Encounter Medications as of 03/04/2021  Medication Sig   albuterol (VENTOLIN HFA) 108 (90 Base) MCG/ACT inhaler Inhale 2 puffs into the lungs every 6 (six) hours as needed for wheezing or shortness of breath.   cholecalciferol (VITAMIN D) 400 UNITS TABS tablet Take 400 Units by mouth daily.   fluticasone furoate-vilanterol (BREO ELLIPTA) 100-25 MCG/INH AEPB Inhale into the lungs.   losartan-hydrochlorothiazide (HYZAAR) 100-12.5 MG tablet TAKE ONE (1) TABLET BY MOUTH EVERY DAY   montelukast (SINGULAIR) 10 MG tablet Take 1 tablet (10 mg total) by  mouth at bedtime.   potassium chloride SA (K-DUR) 20 MEQ tablet TAKE ONE TABLET BY MOUTH TWICE A DAY   amLODipine (NORVASC) 5 MG tablet Take 1 tablet (5 mg total) by mouth daily. (Patient not taking: Reported on 03/04/2021)   No facility-administered encounter medications on file as of 03/04/2021.    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   Qualifier: Diagnosis of  By: [place] LPN, Kim  Left mastectomy  In 2000     Personal history of malignant neoplasm of breast    Past Surgical History:  Procedure Laterality Date   ABDOMINAL HYSTERECTOMY  1973   fibroids   BREAST SURGERY Left 1998   breast ca   FLEXIBLE SIGMOIDOSCOPY N/A 05/25/2016   Procedure: FLEXIBLE SIGMOIDOSCOPY;  Surgeon: Danie Binder, MD;  Location: [facility];  Service: Endoscopy;  Laterality: N/A;  2:00 AM   MASTECTOMY  1997   left breast    TOTAL ABDOMINAL HYSTERECTOMY W/ BILATERAL SALPINGOOPHORECTOMY  1971   unilateral  VESICOVAGINAL FISTULA CLOSURE W/ TAH  1971   Family History  Problem Relation Age of Onset   Parkinsonism Mother    Prostate cancer Father    Kidney disease Sister        nephrectomy   Cancer Brother        throat   Coronary artery disease Other        Family History of males    Breast cancer Sister    Lung cancer Sister    Breast cancer Sister    Cervical cancer Sister    Social History   Socioeconomic History   Marital status: Widowed    Spouse name: Not on file   Number of children: 6   Years of education: Not on file   Highest education  level: Some college, no degree  Occupational History   Occupation: disabled in [place] Use   Smoking status: Former    Years: 40.00    Types: Cigarettes    Quit date: 07/19/1998    Years since quitting: 22.6   Smokeless tobacco: Never  Substance and Sexual Activity   Alcohol use: No   Drug use: No   Sexual activity: Not Currently  Other Topics Concern   Not on file  Social History Narrative   Not on file   Social Determinants of Health   Financial Resource Strain: Low Risk    Difficulty of Paying Living Expenses: Not very hard  Food Insecurity: No Food Insecurity   Worried About Charity fundraiser in the Last Year: Never true   Ran Out of Food in the Last Year: Never true  Transportation Needs: No Transportation Needs   Lack of Transportation (Medical): No   Lack of Transportation (Non-Medical): No  Physical Activity: Inactive   Days of Exercise per Week: 0 days   Minutes of Exercise per Session: 0 min  Stress: No Stress Concern Present   Feeling of Stress : Not at all  Social Connections: Moderately Isolated   Frequency of Communication with Friends and Family: More than three times a week   Frequency of Social Gatherings with Friends and Family: More than three times a week   Attends Religious Services: More than 4 times per year   Active Member of [employer] or Organizations: No   Attends Archivist Meetings: Never   Marital Status: Widowed    Tobacco Counseling Counseling given: Not Answered   Clinical Intake:     Pain : No/denies pain Pain Score: 0-No pain     Nutritional Risks: None Diabetes: No  How often do you need to have someone help you when you read instructions, pamphlets, or other written materials from your doctor or pharmacy?: 1 - Never  Diabetic?NO  Interpreter Needed?: No  Information entered by :: Antione Obar J,CMA   Activities of Daily Living In your present state of health, do you have any difficulty performing the  following activities: 03/04/2021  Hearing? N  Vision? N  Difficulty concentrating or making decisions? N  Walking or climbing stairs? N  Dressing or bathing? N  Doing errands, shopping? N  Preparing Food and eating ? N  Using the Toilet? N  In the past six months, have you accidently leaked urine? Y  Do you have problems with loss of bowel control? N  Managing your Medications? N  Managing your Finances? N  Housekeeping or managing your Housekeeping? N  Some recent data might be hidden    Patient Care Team: Moshe Cipro,  Norwood Levo, MD as PCP - General Sheldon Silvan, Manon Hilding, PA-C as Physician Assistant (Oncology)  Indicate any recent Medical Services you may have received from other than [facility] providers in the past year (date may be approximate).     Assessment:   This is a routine wellness examination for [facility].  Hearing/Vision screen No results found.  Dietary issues and exercise activities discussed: Current Exercise Habits: Home exercise routine, Type of exercise: walking, Time (Minutes): 30, Frequency (Times/Week): 5, Weekly Exercise (Minutes/Week): 150, Intensity: Mild   Goals Addressed   None    Depression Screen PHQ 2/9 Scores 03/04/2021 01/07/2021 12/25/2019 09/20/2019 08/30/2019 05/23/2019 11/30/2018  PHQ - 2 Score 0 0 0 0 0 0 0  PHQ- 9 Score - - - - - - -    Fall Risk Fall Risk  03/04/2021 01/07/2021 12/25/2019 12/20/2019 09/20/2019  Falls in the past year? 0 0 0 0 0  Number falls in past yr: 0 0 0 0 0  Injury with Fall? 0 0 0 0 0  Risk for fall due to : No Fall Risks No Fall Risks - - -  Follow up Falls evaluation completed Falls evaluation completed - - -    FALL RISK PREVENTION PERTAINING TO THE HOME:  Any stairs in or around the home? Yes  If so, are there any without handrails? No  Home free of loose throw rugs in walkways, pet beds, electrical cords, etc? Yes  Adequate lighting in your home to reduce risk of falls? Yes   ASSISTIVE DEVICES UTILIZED TO PREVENT  FALLS:  Life alert? No  Use of a cane, walker or w/c? Yes  Grab bars in the bathroom? No  Shower chair or bench in shower? No  Elevated toilet seat or a handicapped toilet? Yes   TIMED UP AND GO:  Was the test performed?  N/A .  Length of time to ambulate 10 feet: N/A sec.     Cognitive Function:     6CIT Screen 03/04/2021 12/25/2019 02/17/2018 02/17/2018 10/05/2016  What Year? 0 points - 0 points 0 points 0 points  What month? 0 points - 0 points 0 points 0 points  What time? 0 points 0 points 0 points 0 points 0 points  Count back from 20 0 points 0 points 0 points - 0 points  Months in reverse 0 points 0 points 0 points - 0 points  Repeat phrase 0 points 0 points 0 points - 0 points  Total Score 0 - 0 - 0    Immunizations Immunization History  Administered Date(s) Administered   Fluad Quad(high Dose 65+) 05/23/2019   Influenza Whole 05/06/2006, 05/25/2007, 05/06/2009, 03/27/2010   Influenza, High Dose Seasonal PF 08/09/2018   Influenza,inj,Quad PF,6+ Mos 04/12/2013, 05/30/2014, 05/09/2015, 05/28/2016   Moderna Sars-Covid-2 Vaccination 09/27/2019, 10/25/2019   Pneumococcal Conjugate-13 11/11/2017   Pneumococcal Polysaccharide-23 02/14/2007, 01/30/2016   Td 02/14/2007    TDAP status: Due, Education has been provided regarding the importance of this vaccine. Advised may receive this vaccine at local pharmacy or Health Dept. Aware to provide a copy of the vaccination record if obtained from local pharmacy or Health Dept. Verbalized acceptance and understanding.  Flu Vaccine status: Due, Education has been provided regarding the importance of this vaccine. Advised may receive this vaccine at local pharmacy or Health Dept. Aware to provide a copy of the vaccination record if obtained from local pharmacy or Health Dept. Verbalized acceptance and understanding.  Pneumococcal vaccine status: Up to date  Covid-19 vaccine  status: Information provided on how to obtain vaccines.    Qualifies for Shingles Vaccine? Yes   Zostavax completed  N/A   Shingrix Completed?: No.    Education has been provided regarding the importance of this vaccine. Patient has been advised to call insurance company to determine out of pocket expense if they have not yet received this vaccine. Advised may also receive vaccine at local pharmacy or Health Dept. Verbalized acceptance and understanding.  Screening Tests Health Maintenance  Topic Date Due   TETANUS/TDAP  02/13/2017   COVID-19 Vaccine (3 - Moderna risk series) 11/22/2019   INFLUENZA VACCINE  02/17/2021   Zoster Vaccines- Shingrix (1 of 2) 04/09/2021 (Originally 04/15/1960)   DEXA SCAN  Completed   Hepatitis C Screening  Completed   PNA vac Low Risk Adult  Completed   HPV VACCINES  Aged Out    Health Maintenance  Health Maintenance Due  Topic Date Due   TETANUS/TDAP  02/13/2017   COVID-19 Vaccine (3 - Moderna risk series) 11/22/2019   INFLUENZA VACCINE  02/17/2021    Colorectal cancer screening: No longer required.   Mammogram status: Completed 11/02/2020. Repeat every year  Bone Density status: Completed 12/22/2018. Results reflect: Bone density results: NORMAL. Repeat every 0 years.  Lung Cancer Screening: (Low Dose CT Chest recommended if Age 52-80 years, 30 pack-year currently smoking OR have quit w/in 15years.) does not qualify.   Lung Cancer Screening Referral: no  Additional Screening:  Hepatitis C Screening: does qualify; Completed 01/07/2021  Vision Screening: Recommended annual ophthalmology exams for early detection of glaucoma and other disorders of the eye. Is the patient up to date with their annual eye exam?  Yes  Who is the provider or what is the name of the office in which the patient attends annual eye exams? [facility]) If pt is not established with a provider, would they like to be referred to a provider to establish care?  N/A .   Dental Screening: Recommended annual dental exams for  proper oral hygiene  Community Resource Referral / Chronic Care Management: CRR required this visit?  No   CCM required this visit?  No      Plan:     I have personally reviewed and noted the following in the patient's chart:   Medical and social history Use of alcohol, tobacco or illicit drugs  Current medications and supplements including opioid prescriptions.  Functional ability and status Nutritional status Physical activity Advanced directives List of other physicians Hospitalizations, surgeries, and ER visits in previous 12 months Vitals Screenings to include cognitive, depression, and falls Referrals and appointments  In addition, I have reviewed and discussed with patient certain preventive protocols, quality metrics, and best practice recommendations. A written personalized care plan for preventive services as well as general preventive health recommendations were provided to patient.     Edgar Frisk, [place]   03/04/2021   Nurse Notes:  Ms. Brain , Thank you for taking time to come for your Medicare Wellness Visit. I appreciate your ongoing commitment to your health goals. Please review the following plan we discussed and let me know if I can assist you in the future.   These are the goals we discussed:  Goals      Increase physical activity     Start walking 5 days a week for 30 minutes, 5 days a week      Increase water intake     Recommend increasing water intake to 8, 8 ounce  glasses a day (64 ounces).     LIFESTYLE - DECREASE FALLS RISK        This is a list of the screening recommended for you and due dates:  Health Maintenance  Topic Date Due   Tetanus Vaccine  02/13/2017   COVID-19 Vaccine (3 - Moderna risk series) 11/22/2019   Flu Shot  02/17/2021   Zoster (Shingles) Vaccine (1 of 2) 04/09/2021*   DEXA scan (bone density measurement)  Completed   Hepatitis C Screening: USPSTF Recommendation to screen - Ages 18-79 yo.  Completed    Pneumonia vaccines  Completed   HPV Vaccine  Aged Out  *Topic was postponed. The date shown is not the original due date.

## 2021-03-04 NOTE — Patient Instructions (Signed)
Health Maintenance, Female Adopting a healthy lifestyle and getting preventive care are important in promoting health and wellness. Ask your health care provider about: The right schedule for you to have regular tests and exams. Things you can do on your own to prevent diseases and keep yourself healthy. What should I know about diet, weight, and exercise? Eat a healthy diet  Eat a diet that includes plenty of vegetables, fruits, low-fat dairy products, and lean protein. Do not eat a lot of foods that are high in solid fats, added sugars, or sodium.  Maintain a healthy weight Body mass index (BMI) is used to identify weight problems. It estimates body fat based on height and weight. Your health care provider can help determineyour BMI and help you achieve or maintain a healthy weight. Get regular exercise Get regular exercise. This is one of the most important things you can do for your health. Most adults should: Exercise for at least 150 minutes each week. The exercise should increase your heart rate and make you sweat (moderate-intensity exercise). Do strengthening exercises at least twice a week. This is in addition to the moderate-intensity exercise. Spend less time sitting. Even light physical activity can be beneficial. Watch cholesterol and blood lipids Have your blood tested for lipids and cholesterol at 80 years of age, then havethis test every 5 years. Have your cholesterol levels checked more often if: Your lipid or cholesterol levels are high. You are older than 80 years of age. You are at high risk for heart disease. What should I know about cancer screening? Depending on your health history and family history, you may need to have cancer screening at various ages. This may include screening for: Breast cancer. Cervical cancer. Colorectal cancer. Skin cancer. Lung cancer. What should I know about heart disease, diabetes, and high blood pressure? Blood pressure and heart  disease High blood pressure causes heart disease and increases the risk of stroke. This is more likely to develop in people who have high blood pressure readings, are of African descent, or are overweight. Have your blood pressure checked: Every 3-5 years if you are 18-39 years of age. Every year if you are 40 years old or older. Diabetes Have regular diabetes screenings. This checks your fasting blood sugar level. Have the screening done: Once every three years after age 40 if you are at a normal weight and have a low risk for diabetes. More often and at a younger age if you are overweight or have a high risk for diabetes. What should I know about preventing infection? Hepatitis B If you have a higher risk for hepatitis B, you should be screened for this virus. Talk with your health care provider to find out if you are at risk forhepatitis B infection. Hepatitis C Testing is recommended for: Everyone born from 1945 through 1965. Anyone with known risk factors for hepatitis C. Sexually transmitted infections (STIs) Get screened for STIs, including gonorrhea and chlamydia, if: You are sexually active and are younger than 80 years of age. You are older than 80 years of age and your health care provider tells you that you are at risk for this type of infection. Your sexual activity has changed since you were last screened, and you are at increased risk for chlamydia or gonorrhea. Ask your health care provider if you are at risk. Ask your health care provider about whether you are at high risk for HIV. Your health care provider may recommend a prescription medicine to help   prevent HIV infection. If you choose to take medicine to prevent HIV, you should first get tested for HIV. You should then be tested every 3 months for as long as you are taking the medicine. Pregnancy If you are about to stop having your period (premenopausal) and you may become pregnant, seek counseling before you get  pregnant. Take 400 to 800 micrograms (mcg) of folic acid every day if you become pregnant. Ask for birth control (contraception) if you want to prevent pregnancy. Osteoporosis and menopause Osteoporosis is a disease in which the bones lose minerals and strength with aging. This can result in bone fractures. If you are 65 years old or older, or if you are at risk for osteoporosis and fractures, ask your health care provider if you should: Be screened for bone loss. Take a calcium or vitamin D supplement to lower your risk of fractures. Be given hormone replacement therapy (HRT) to treat symptoms of menopause. Follow these instructions at home: Lifestyle Do not use any products that contain nicotine or tobacco, such as cigarettes, e-cigarettes, and chewing tobacco. If you need help quitting, ask your health care provider. Do not use street drugs. Do not share needles. Ask your health care provider for help if you need support or information about quitting drugs. Alcohol use Do not drink alcohol if: Your health care provider tells you not to drink. You are pregnant, may be pregnant, or are planning to become pregnant. If you drink alcohol: Limit how much you use to 0-1 drink a day. Limit intake if you are breastfeeding. Be aware of how much alcohol is in your drink. In the [place] one drink equals one 12 oz bottle of beer (355 mL), one 5 oz glass of wine (148 mL), or one 1 oz glass of hard liquor (44 mL). General instructions Schedule regular health, dental, and eye exams. Stay current with your vaccines. Tell your health care provider if: You often feel depressed. You have ever been abused or do not feel safe at home. Summary Adopting a healthy lifestyle and getting preventive care are important in promoting health and wellness. Follow your health care provider's instructions about healthy diet, exercising, and getting tested or screened for diseases. Follow your health care provider's  instructions on monitoring your cholesterol and blood pressure. This information is not intended to replace advice given to you by your health care provider. Make sure you discuss any questions you have with your healthcare provider. Document Revised: 06/29/2018 Document Reviewed: 06/29/2018 Elsevier Patient Education  2022 [employer]  

## 2021-03-11 ENCOUNTER — Ambulatory Visit: Payer: Medicare Other | Admitting: Family Medicine

## 2021-04-18 ENCOUNTER — Ambulatory Visit: Payer: Medicare Other | Admitting: Family Medicine

## 2021-06-04 ENCOUNTER — Ambulatory Visit: Payer: Medicare Other | Admitting: Family Medicine

## 2021-09-04 ENCOUNTER — Ambulatory Visit: Payer: Medicare Other | Admitting: Family Medicine

## 2021-09-12 ENCOUNTER — Telehealth: Payer: Self-pay

## 2021-09-12 NOTE — Telephone Encounter (Signed)
Pt left vm that she was having chest pain x 1 week in the center of her chest. I advised ER to be evaluated and rule out serious possibilities. Pt understands

## 2021-10-14 DIAGNOSIS — H25813 Combined forms of age-related cataract, bilateral: Secondary | ICD-10-CM | POA: Diagnosis not present

## 2021-10-31 ENCOUNTER — Other Ambulatory Visit (HOSPITAL_COMMUNITY): Payer: Self-pay | Admitting: Family Medicine

## 2021-10-31 DIAGNOSIS — Z1231 Encounter for screening mammogram for malignant neoplasm of breast: Secondary | ICD-10-CM

## 2021-11-11 ENCOUNTER — Ambulatory Visit: Payer: Medicare Other | Admitting: Family Medicine

## 2021-11-12 ENCOUNTER — Ambulatory Visit (INDEPENDENT_AMBULATORY_CARE_PROVIDER_SITE_OTHER): Payer: Medicare Other | Admitting: Family Medicine

## 2021-11-12 ENCOUNTER — Encounter: Payer: Self-pay | Admitting: Family Medicine

## 2021-11-12 ENCOUNTER — Ambulatory Visit (HOSPITAL_COMMUNITY)
Admission: RE | Admit: 2021-11-12 | Discharge: 2021-11-12 | Disposition: A | Discharge status: Home / Self Care | Payer: Medicare Other | Source: Ambulatory Visit | Attending: Family Medicine | Admitting: Family Medicine

## 2021-11-12 VITALS — BP 180/100 | HR 81 | Resp 16 | Ht 64.0 in | Wt 252.4 lb

## 2021-11-12 DIAGNOSIS — R7303 Prediabetes: Secondary | ICD-10-CM | POA: Diagnosis not present

## 2021-11-12 DIAGNOSIS — Z853 Personal history of malignant neoplasm of breast: Secondary | ICD-10-CM | POA: Diagnosis not present

## 2021-11-12 DIAGNOSIS — E785 Hyperlipidemia, unspecified: Secondary | ICD-10-CM

## 2021-11-12 DIAGNOSIS — I10 Essential (primary) hypertension: Secondary | ICD-10-CM | POA: Diagnosis not present

## 2021-11-12 DIAGNOSIS — Z1231 Encounter for screening mammogram for malignant neoplasm of breast: Secondary | ICD-10-CM | POA: Insufficient documentation

## 2021-11-12 DIAGNOSIS — E559 Vitamin D deficiency, unspecified: Secondary | ICD-10-CM | POA: Diagnosis not present

## 2021-11-12 DIAGNOSIS — J4540 Moderate persistent asthma, uncomplicated: Secondary | ICD-10-CM | POA: Diagnosis not present

## 2021-11-12 MED ORDER — LOSARTAN POTASSIUM-HCTZ 100-12.5 MG PO TABS: 1.0000 | ORAL_TABLET | Freq: Every day | ORAL | 3 refills | Status: DC

## 2021-11-12 MED ORDER — AMLODIPINE BESYLATE 10 MG PO TABS: 10.0000 mg | ORAL_TABLET | Freq: Every day | ORAL | 3 refills | Status: DC

## 2021-11-12 NOTE — Patient Instructions (Addendum)
Annual exam  June 22 or  shortly after re evaluate blood pressure ? ?Fasting CBC, lipid, cmp and EGFr, HBA1C, TSH and vit D this week ? ?Blood pressure is too high, need to take amlodipine 10 mg every day, and hyzaar 100/12.5 mg  every day at the same time  ? ?Please get  shingrix vaccines  at your pharmacy ? ?I will refer you to oncology for follow up ? ?Thanks for choosing [facility], we consider it a privelige to serve you. ? ? ? ? ? ?

## 2021-11-17 ENCOUNTER — Encounter: Payer: Self-pay | Admitting: Family Medicine

## 2021-11-17 NOTE — Assessment & Plan Note (Signed)
Stable , only uses rescue inhaler, max twice per month ?

## 2021-11-17 NOTE — Assessment & Plan Note (Signed)
?  Patient re-educated about  the importance of commitment to a  minimum of 150 minutes of exercise per week as able. ? ?The importance of healthy food choices with portion control discussed, as well as eating regularly and within a 12 hour window most days. ?The need to choose "clean , green" food 50 to 75% of the time is discussed, as well as to make water the primary drink and set a goal of 64 ounces water daily. ? ?  ? ?  11/12/2021  ?  3:07 PM 01/07/2021  ? 10:11 AM 12/25/2019  ?  8:20 AM  ?Weight /BMI  ?Weight 252 lb 6.4 oz 247 lb 245 lb  ?Height '5\' 4"'$  (1.626 m) '5\' 4"'$  (1.626 m) '5\' 4"'$  (1.626 m)  ?BMI 43.32 kg/m2 42.4 kg/m2 42.05 kg/m2  ? ?deteriorrtaed ? ?

## 2021-11-17 NOTE — Progress Notes (Signed)
? ?Heather [name]     MRN: [id number]      DOB: Apr 14, 1941 ? ? ?HPI ?Heather [name] is here for follow up and re-evaluation of chronic medical conditions, medication management and review of any available recent lab and radiology data.  ?Preventive health is updated, specifically  Cancer screening and Immunization.   ?Questions or concerns regarding consultations or procedures which the PT has had in the interim are  addressed. ?The PT denies any adverse reactions to current medications since the last visit. Inadvertently not taking amlodipine daily as prescribed ?There are no new concerns.  ?There are no specific complaints  ? ?ROS ?Denies recent fever or chills. ?Denies sinus pressure, nasal congestion, ear pain or sore throat. ?Denies chest congestion, productive cough or wheezing. ?Denies chest pains, palpitations and leg swelling ?Denies abdominal pain, nausea, vomiting,diarrhea or constipation.   ?Denies dysuria, frequency, hesitancy or incontinence. ?Denies joint pain, swelling and limitation in mobility. ?Denies headaches, seizures, numbness, or tingling. ?Denies depression, anxiety or insomnia. ?Denies skin break down or rash. ? ? ?PE ? ?BP (!) 180/100   Pulse 81   Resp 16   Ht '5\' 4"'$  (1.626 m)   Wt 252 lb 6.4 oz (114.5 kg)   SpO2 94%   BMI 43.32 kg/m?  ? ?Patient alert and oriented . ? ?HEENT: No facial asymmetry, EOMI,     Neck supple . ? ?Chest: Clear to auscultation bilaterally. ? ?CVS: S1, S2 no murmurs, no S3.Regular rate. ? ?ABD: Soft non tender.  ? ?Ext: No edema ? ?MS: decreased ROM spine, shoulders, hips and knees. ? ?Skin: Intact, no ulcerations or rash noted. ? ?Psych: Good eye contact, normal affect. Memory intact not anxious or depressed appearing. ? ?CNS: CN 2-12 intact, power,  normal throughout.no focal deficits noted. ? ? ?Assessment & Plan ? ?Hypertension ?Uncontrolled, non compliant with meds, re educated  ?DASH diet and commitment to daily physical activity for a minimum of 30  minutes discussed and encouraged, as a part of hypertension management. ?The importance of attaining a healthy weight is also discussed. ? ? ?  11/12/2021  ?  3:40 PM 11/12/2021  ?  3:07 PM 01/07/2021  ? 11:03 AM 01/07/2021  ? 10:11 AM 12/25/2019  ?  8:20 AM 12/20/2019  ? 11:51 AM 12/20/2019  ? 11:15 AM  ?BP/Weight  ?Systolic BP 761 607 371 062 130 130 142  ?Diastolic BP 694 68 854 93 84 84 84  ?Wt. (Lbs)  252.4  247 245  245  ?BMI  43.32 kg/m2  42.4 kg/m2 42.05 kg/m2  42.05 kg/m2  ? ? ? ? ? ?MORBID OBESITY ? ?Patient re-educated about  the importance of commitment to a  minimum of 150 minutes of exercise per week as able. ? ?The importance of healthy food choices with portion control discussed, as well as eating regularly and within a 12 hour window most days. ?The need to choose "clean , green" food 50 to 75% of the time is discussed, as well as to make water the primary drink and set a goal of 64 ounces water daily. ? ?  ? ?  11/12/2021  ?  3:07 PM 01/07/2021  ? 10:11 AM 12/25/2019  ?  8:20 AM  ?Weight /BMI  ?Weight 252 lb 6.4 oz 247 lb 245 lb  ?Height '5\' 4"'$  (1.626 m) '5\' 4"'$  (1.626 m) '5\' 4"'$  (1.626 m)  ?BMI 43.32 kg/m2 42.4 kg/m2 42.05 kg/m2  ? ?deteriorrtaed ? ? ?Dyslipidemia ?Hyperlipidemia:Low fat diet discussed and  encouraged. ? ? ?Lipid Panel  ?Lab Results  ?Component Value Date  ? CHOL 193 01/07/2021  ? HDL 50 01/07/2021  ? LDLCALC 130 (H) 01/07/2021  ? TRIG 69 01/07/2021  ? CHOLHDL 3.9 01/07/2021  ? ?Updated lab needed at/ before next visit. ? ? ? ? ?Asthma ?Stable , only uses rescue inhaler, max twice per month ? ?History of breast cancer ?Normal mammogram in 2023, referred for annual  follow up with Oncology ? ?

## 2021-11-17 NOTE — Assessment & Plan Note (Signed)
Normal mammogram in 2023, referred for annual  follow up with Oncology ?

## 2021-11-17 NOTE — Assessment & Plan Note (Signed)
Hyperlipidemia:Low fat diet discussed and encouraged. ? ? ?Lipid Panel  ?Lab Results  ?Component Value Date  ? CHOL 193 01/07/2021  ? HDL 50 01/07/2021  ? LDLCALC 130 (H) 01/07/2021  ? TRIG 69 01/07/2021  ? CHOLHDL 3.9 01/07/2021  ? ?Updated lab needed at/ before next visit. ? ? ? ?

## 2021-11-17 NOTE — Assessment & Plan Note (Signed)
Uncontrolled, non compliant with meds, re educated  ?DASH diet and commitment to daily physical activity for a minimum of 30 minutes discussed and encouraged, as a part of hypertension management. ?The importance of attaining a healthy weight is also discussed. ? ? ?  11/12/2021  ?  3:40 PM 11/12/2021  ?  3:07 PM 01/07/2021  ? 11:03 AM 01/07/2021  ? 10:11 AM 12/25/2019  ?  8:20 AM 12/20/2019  ? 11:51 AM 12/20/2019  ? 11:15 AM  ?BP/Weight  ?Systolic BP 939 688 648 472 130 130 142  ?Diastolic BP 072 68 182 93 84 84 84  ?Wt. (Lbs)  252.4  247 245  245  ?BMI  43.32 kg/m2  42.4 kg/m2 42.05 kg/m2  42.05 kg/m2  ? ? ? ? ?

## 2021-11-19 DIAGNOSIS — R7303 Prediabetes: Secondary | ICD-10-CM | POA: Diagnosis not present

## 2021-11-19 DIAGNOSIS — E559 Vitamin D deficiency, unspecified: Secondary | ICD-10-CM | POA: Diagnosis not present

## 2021-11-19 DIAGNOSIS — E785 Hyperlipidemia, unspecified: Secondary | ICD-10-CM | POA: Diagnosis not present

## 2021-11-19 DIAGNOSIS — I10 Essential (primary) hypertension: Secondary | ICD-10-CM | POA: Diagnosis not present

## 2021-11-20 ENCOUNTER — Other Ambulatory Visit: Payer: Self-pay | Admitting: Family Medicine

## 2021-11-20 LAB — CBC
Hematocrit: 40 % (ref 34.0–46.6)
Hemoglobin: 13.1 g/dL (ref 11.1–15.9)
MCH: 29.9 pg (ref 26.6–33.0)
MCHC: 32.8 g/dL (ref 31.5–35.7)
MCV: 91 fL (ref 79–97)
Platelets: 363 10*3/uL (ref 150–450)
RBC: 4.38 x10E6/uL (ref 3.77–5.28)
RDW: 12.6 % (ref 11.7–15.4)
WBC: 9 10*3/uL (ref 3.4–10.8)

## 2021-11-20 LAB — HEMOGLOBIN A1C
Est. average glucose Bld gHb Est-mCnc: 114 mg/dL
Hgb A1c MFr Bld: 5.6 % (ref 4.8–5.6)

## 2021-11-20 LAB — VITAMIN D 25 HYDROXY (VIT D DEFICIENCY, FRACTURES): Vit D, 25-Hydroxy: 52.9 ng/mL (ref 30.0–100.0)

## 2021-11-20 LAB — CMP14+EGFR
ALT: 6 IU/L (ref 0–32)
AST: 12 IU/L (ref 0–40)
Albumin/Globulin Ratio: 1.3 (ref 1.2–2.2)
Albumin: 4.3 g/dL (ref 3.7–4.7)
Alkaline Phosphatase: 93 IU/L (ref 44–121)
BUN/Creatinine Ratio: 19 (ref 12–28)
BUN: 20 mg/dL (ref 8–27)
Bilirubin Total: 0.4 mg/dL (ref 0.0–1.2)
CO2: 25 mmol/L (ref 20–29)
Calcium: 9.7 mg/dL (ref 8.7–10.3)
Chloride: 100 mmol/L (ref 96–106)
Creatinine, Ser: 1.05 mg/dL — ABNORMAL HIGH (ref 0.57–1.00)
Globulin, Total: 3.4 g/dL (ref 1.5–4.5)
Glucose: 97 mg/dL (ref 70–99)
Potassium: 4.5 mmol/L (ref 3.5–5.2)
Sodium: 140 mmol/L (ref 134–144)
Total Protein: 7.7 g/dL (ref 6.0–8.5)
eGFR: 54 mL/min/{1.73_m2} — ABNORMAL LOW (ref >59)

## 2021-11-20 LAB — TSH: TSH: 3.36 u[IU]/mL (ref 0.450–4.500)

## 2021-11-20 LAB — LIPID PANEL
Chol/HDL Ratio: 4.5 ratio — ABNORMAL HIGH (ref 0.0–4.4)
Cholesterol, Total: 210 mg/dL — ABNORMAL HIGH (ref 100–199)
HDL: 47 mg/dL (ref >39)
LDL Chol Calc (NIH): 149 mg/dL — ABNORMAL HIGH (ref 0–99)
Triglycerides: 79 mg/dL (ref 0–149)
VLDL Cholesterol Cal: 14 mg/dL (ref 5–40)

## 2021-11-20 MED ORDER — ROSUVASTATIN CALCIUM 5 MG PO TABS: 5.0000 mg | ORAL_TABLET | Freq: Every day | ORAL | 5 refills | Status: DC

## 2021-12-11 ENCOUNTER — Inpatient Hospital Stay (HOSPITAL_COMMUNITY): Payer: Medicare Other | Attending: Hematology | Admitting: Hematology

## 2021-12-11 ENCOUNTER — Encounter (HOSPITAL_COMMUNITY): Payer: Self-pay | Admitting: Hematology

## 2021-12-11 VITALS — BP 133/69 | HR 94 | Temp 97.6°F | Resp 18 | Ht 64.0 in | Wt 248.3 lb

## 2021-12-11 DIAGNOSIS — Z923 Personal history of irradiation: Secondary | ICD-10-CM | POA: Insufficient documentation

## 2021-12-11 DIAGNOSIS — Z9221 Personal history of antineoplastic chemotherapy: Secondary | ICD-10-CM | POA: Insufficient documentation

## 2021-12-11 DIAGNOSIS — Z87891 Personal history of nicotine dependence: Secondary | ICD-10-CM | POA: Insufficient documentation

## 2021-12-11 DIAGNOSIS — Z9012 Acquired absence of left breast and nipple: Secondary | ICD-10-CM | POA: Diagnosis not present

## 2021-12-11 DIAGNOSIS — Z8041 Family history of malignant neoplasm of ovary: Secondary | ICD-10-CM | POA: Insufficient documentation

## 2021-12-11 DIAGNOSIS — Z801 Family history of malignant neoplasm of trachea, bronchus and lung: Secondary | ICD-10-CM | POA: Insufficient documentation

## 2021-12-11 DIAGNOSIS — C50912 Malignant neoplasm of unspecified site of left female breast: Secondary | ICD-10-CM

## 2021-12-11 DIAGNOSIS — Z853 Personal history of malignant neoplasm of breast: Secondary | ICD-10-CM

## 2021-12-11 DIAGNOSIS — Z803 Family history of malignant neoplasm of breast: Secondary | ICD-10-CM | POA: Diagnosis not present

## 2021-12-11 NOTE — Progress Notes (Signed)
[facility] [address], [place] [address]   Patient Care Team: Fayrene Helper, MD as PCP - General Kefalas, Manon Hilding, PA-C (Inactive) as Physician Assistant (Oncology) Derek Jack, MD as Medical Oncologist (Medical Oncology)  CHIEF COMPLAINTS/PURPOSE OF CONSULTATION:  History of left breast cancer  HISTORY OF PRESENTING ILLNESS:  Heather [name] 81 y.o. female is here because of recent diagnosis of history of left breast cancer.  Today she reports feeling good. She denies new pains. She denies fevers, night sweats, and weight loss. She denies history of cardiac issues. She takes calcium and vitamin D.   She lives with her son, but he works in [place] and is home about every 2 months. Her daughter visits her frequently. 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 She quit smoking over 10 years ago, and she smoked about 1 pack a week. Her dad had prostate cancer, her maternal grandmother had cancer and "bled to death" although she is not sure what type of cancer she had, one sister had breast and lung cancer, another sister had breast and ovarian cancer, another sister had brain cancer, and her brother had throat cancer.  In terms of breast cancer risk profile:  She menarched at early age of 89 and had a hysterectomy at age 72. She had 6 pregnancies, her first child was born at age 64  She never received birth control pills.  She was never exposed to fertility medications or hormone replacement therapy.  She has family history of Breast/GYN/GI cancer  I reviewed her records extensively and collaborated the history with the patient.  SUMMARY OF ONCOLOGIC HISTORY: Oncology History Overview Note  stage II B. 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 All therapy  finished as of September 1998     History of Invasive medullary carcinoma of left breast  05/30/1996 Surgery   Modified radical mastectomy demonstrating a Stage IIB (T2N1M0) medullary carcinoma of left breast with 4/13 positive lymph nodes.  ER/PR negative.    08/07/1996 - 01/01/1997 Chemotherapy   CMFx 6 cycles    01/24/1997 - 04/16/1997 Radiation Therapy   Approximate start and completion dates.  A few breaks in radiation therapy due to desquamation      MEDICAL HISTORY:  Past Medical History:  Diagnosis Date   Annual physical exam 05/30/2014   Asthma    Breast cancer ([facility]) 1998   left   Congestive heart failure ([facility])    FATIGUE 09/20/2008   Qualifier: Diagnosis of  By: Moshe Cipro MD, Margaret     FH: CAD (coronary artery disease)    Gallstones 05/30/2014   GERD (gastroesophageal reflux disease)    Heart failure    Hypertension 1998   Invasive medullary carcinoma of left breast 07/29/2007   Qualifier: Diagnosis of  By: [place] LPN, Kim  Left mastectomy  In 2000     Personal history of malignant neoplasm of breast     SURGICAL HISTORY: Past Surgical History:  Procedure Laterality Date   ABDOMINAL HYSTERECTOMY  1973   fibroids   BREAST SURGERY Left 1998   breast ca   FLEXIBLE SIGMOIDOSCOPY N/A 05/25/2016   Procedure: FLEXIBLE SIGMOIDOSCOPY;  Surgeon: Danie Binder, MD;  Location: [facility];  Service: Endoscopy;  Laterality: N/A;  2:00 AM   MASTECTOMY  1997  left breast    TOTAL ABDOMINAL HYSTERECTOMY W/ BILATERAL SALPINGOOPHORECTOMY  1971   unilateral    VESICOVAGINAL FISTULA CLOSURE W/ TAH  1971    SOCIAL HISTORY: Social History   Socioeconomic History   Marital status: Widowed    Spouse name: Not on file   Number of children: 6   Years of education: Not on file   Highest education level: Some college, no degree  Occupational History   Occupation: disabled in [place] Use   Smoking status: Former    Years: 40.00    Types: Cigarettes    Quit  date: 07/19/1998    Years since quitting: 23.4   Smokeless tobacco: Never  Substance and Sexual Activity   Alcohol use: No   Drug use: No   Sexual activity: Not Currently  Other Topics Concern   Not on file  Social History Narrative   Not on file   Social Determinants of Health   Financial Resource Strain: Low Risk    Difficulty of Paying Living Expenses: Not very hard  Food Insecurity: No Food Insecurity   Worried About Charity fundraiser in the Last Year: Never true   Ran Out of Food in the Last Year: Never true  Transportation Needs: No Transportation Needs   Lack of Transportation (Medical): No   Lack of Transportation (Non-Medical): No  Physical Activity: Inactive   Days of Exercise per Week: 0 days   Minutes of Exercise per Session: 0 min  Stress: No Stress Concern Present   Feeling of Stress : Not at all  Social Connections: Moderately Isolated   Frequency of Communication with Friends and Family: More than three times a week   Frequency of Social Gatherings with Friends and Family: More than three times a week   Attends Religious Services: More than 4 times per year   Active Member of [employer] or Organizations: No   Attends Archivist Meetings: Never   Marital Status: Widowed  Human resources officer Violence: Not At Risk   Fear of Current or Ex-Partner: No   Emotionally Abused: No   Physically Abused: No   Sexually Abused: No    FAMILY HISTORY: Family History  Problem Relation Age of Onset   Parkinsonism Mother    Prostate cancer Father    Kidney disease Sister        nephrectomy   Cancer Brother        throat   Coronary artery disease Other        Family History of males    Breast cancer Sister    Lung cancer Sister    Breast cancer Sister    Cervical cancer Sister     ALLERGIES:  is allergic to aspirin and penicillins.  MEDICATIONS:  Current Outpatient Medications  Medication Sig Dispense Refill   amLODipine (NORVASC) 10 MG tablet Take 1  tablet (10 mg total) by mouth daily. 30 tablet 3   cholecalciferol (VITAMIN D) 400 UNITS TABS tablet Take 400 Units by mouth daily.     losartan-hydrochlorothiazide (HYZAAR) 100-12.5 MG tablet Take 1 tablet by mouth daily. 90 tablet 3   potassium chloride SA (K-DUR) 20 MEQ tablet TAKE ONE TABLET BY MOUTH TWICE A DAY 180 tablet 2   PROAIR HFA 108 (90 Base) MCG/ACT inhaler INHALE TWO PUFFS INTO THE LUNGS EVERY SIX HOURS AS NEEDED FOR WHEEZING OR SHORTNESS OF BREATH 8.5 g 1   rosuvastatin (CRESTOR) 5 MG tablet Take 1 tablet (5 mg total) by  mouth daily. 30 tablet 5   fluticasone furoate-vilanterol (BREO ELLIPTA) 100-25 MCG/INH AEPB Inhale into the lungs. (Patient not taking: Reported on 11/12/2021)     No current facility-administered medications for this visit.    REVIEW OF SYSTEMS:   Review of Systems  Constitutional:  Negative for appetite change, fatigue, fever and unexpected weight change.  Endocrine: Negative for hot flashes.  Musculoskeletal:  Negative for arthralgias.  All other systems reviewed and are negative.  PHYSICAL EXAMINATION: ECOG PERFORMANCE STATUS: 1 - Symptomatic but completely ambulatory  Vitals:   12/11/21 1335  BP: 133/69  Pulse: 94  Resp: 18  Temp: 97.6 F (36.4 C)  SpO2: 97%   Filed Weights   12/11/21 1335  Weight: 248 lb 4.8 oz (112.6 kg)   Physical Exam Vitals reviewed.  Constitutional:      Appearance: Normal appearance. She is obese.  Cardiovascular:     Rate and Rhythm: Normal rate and regular rhythm.     Pulses: Normal pulses.     Heart sounds: Normal heart sounds.  Pulmonary:     Effort: Pulmonary effort is normal.     Breath sounds: Normal breath sounds.  Chest:  Breasts:    Right: Normal. No swelling, bleeding, inverted nipple, mass, nipple discharge, skin change or tenderness.     Left: Absent. No swelling, bleeding, mass, skin change (mastectomy site WNL) or tenderness.  Lymphadenopathy:     Upper Body:     Right upper body: No  supraclavicular or axillary adenopathy.     Left upper body: No supraclavicular or axillary adenopathy.  Neurological:     General: No focal deficit present.     Mental Status: She is alert and oriented to person, place, and time.  Psychiatric:        Mood and Affect: Mood normal.        Behavior: Behavior normal.    Breast Exam Chaperone: Thana Ates    LABORATORY DATA:  I have reviewed the data as listed Recent Results (from the past 2160 hour(s))  CBC     Status: None   Collection Time: 11/19/21 10:16 AM  Result Value Ref Range   WBC 9.0 3.4 - 10.8 x10E3/uL   RBC 4.38 3.77 - 5.28 x10E6/uL   Hemoglobin 13.1 11.1 - 15.9 g/dL   Hematocrit 40.0 34.0 - 46.6 %   MCV 91 79 - 97 fL   MCH 29.9 26.6 - 33.0 pg   MCHC 32.8 31.5 - 35.7 g/dL   RDW 12.6 11.7 - 15.4 %   Platelets 363 150 - 450 x10E3/uL  Lipid panel     Status: Abnormal   Collection Time: 11/19/21 10:16 AM  Result Value Ref Range   Cholesterol, Total 210 (H) 100 - 199 mg/dL   Triglycerides 79 0 - 149 mg/dL   HDL 47 >39 mg/dL   VLDL Cholesterol Cal 14 5 - 40 mg/dL   LDL Chol Calc (NIH) 149 (H) 0 - 99 mg/dL   Chol/HDL Ratio 4.5 (H) 0.0 - 4.4 ratio    Comment:                                   T. Chol/HDL Ratio  Men  Women                               1/2 Avg.Risk  3.4    3.3                                   Avg.Risk  5.0    4.4                                2X Avg.Risk  9.6    7.1                                3X Avg.Risk 23.4   11.0   CMP14+EGFR     Status: Abnormal   Collection Time: 11/19/21 10:16 AM  Result Value Ref Range   Glucose 97 70 - 99 mg/dL   BUN 20 8 - 27 mg/dL   Creatinine, Ser 1.05 (H) 0.57 - 1.00 mg/dL   eGFR 54 (L) >59 mL/min/1.73   BUN/Creatinine Ratio 19 12 - 28   Sodium 140 134 - 144 mmol/L   Potassium 4.5 3.5 - 5.2 mmol/L   Chloride 100 96 - 106 mmol/L   CO2 25 20 - 29 mmol/L   Calcium 9.7 8.7 - 10.3 mg/dL   Total Protein 7.7 6.0 - 8.5 g/dL    Albumin 4.3 3.7 - 4.7 g/dL   Globulin, Total 3.4 1.5 - 4.5 g/dL   Albumin/Globulin Ratio 1.3 1.2 - 2.2   Bilirubin Total 0.4 0.0 - 1.2 mg/dL   Alkaline Phosphatase 93 44 - 121 IU/L   AST 12 0 - 40 IU/L   ALT 6 0 - 32 IU/L  Hemoglobin A1c     Status: None   Collection Time: 11/19/21 10:16 AM  Result Value Ref Range   Hgb A1c MFr Bld 5.6 4.8 - 5.6 %    Comment:          Prediabetes: 5.7 - 6.4          Diabetes: >6.4          Glycemic control for adults with diabetes: <7.0    Est. average glucose Bld gHb Est-mCnc 114 mg/dL  TSH     Status: None   Collection Time: 11/19/21 10:16 AM  Result Value Ref Range   TSH 3.360 0.450 - 4.500 uIU/mL  VITAMIN D 25 Hydroxy (Vit-D Deficiency, Fractures)     Status: None   Collection Time: 11/19/21 10:16 AM  Result Value Ref Range   Vit D, 25-Hydroxy 52.9 30.0 - 100.0 ng/mL    Comment: Vitamin D deficiency has been defined by the Institute of Medicine and an Endocrine Society practice guideline as a level of serum 25-OH vitamin D less than 20 ng/mL (1,2). The Endocrine Society went on to further define vitamin D insufficiency as a level between 21 and 29 ng/mL (2). 1. IOM (Institute of Medicine). 2010. Dietary reference    intakes for calcium and D. [place]: The    [employer]. 2. Holick MF, Binkley [place], Bischoff-Ferrari HA, et al.    Evaluation, treatment, and prevention of vitamin D    deficiency: an Endocrine Society clinical practice    guideline. JCEM. 2011 Jul; 96(7):1911-30.     RADIOGRAPHIC  STUDIES: I have personally reviewed the radiological reports and agreed with the findings in the report. MM 3D SCREEN BREAST UNI RIGHT  Result Date: 11/13/2021 CLINICAL DATA:  Screening. EXAM: DIGITAL SCREENING UNILATERAL RIGHT MAMMOGRAM WITH CAD AND TOMOSYNTHESIS TECHNIQUE: Right screening digital craniocaudal and mediolateral oblique mammograms were obtained. Right screening digital breast tomosynthesis was performed. The  images were evaluated with computer-aided detection. COMPARISON:  Previous exam(s). ACR Breast Density Category b: There are scattered areas of fibroglandular density. FINDINGS: There are no findings suspicious for malignancy. IMPRESSION: No mammographic evidence of malignancy. A result letter of this screening mammogram will be mailed directly to the patient. RECOMMENDATION: Screening mammogram in one year. (Code:SM-B-01Y) BI-RADS CATEGORY  1: Negative. Electronically Signed   By: Franki Cabot M.D.   On: 11/13/2021 08:23     ASSESSMENT:  History of stage IIb (T2 N1 M0) left breast cancer: - Left modified radical mastectomy (05/30/1996)-medullary carcinoma, 4/13 lymph nodes positive, ER/PR/HER2 negative - Treated with CMF chemotherapy x6 cycles, followed by radiation therapy, treatment completed in September 1998    Social/family history: - She lives at home mostly by herself.  Her son lives with her occasionally.  Daughter checks on her every day.  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  Quit smoking more than 10 years ago.  She smoked 1 pack/week for many years. - Father had prostate cancer and maternal grandmother had cancer.  Brother had throat cancer.  1 sister had breast cancer and another sister lung and third sister ovarian cancer.   PLAN:  History of left breast cancer: - Physical examination today shows left mastectomy site is within normal limits.  Right breast has no palpable masses. - No clinical signs or symptoms of recurrence. - Labs from 11/19/2021 showed normal LFTs and CBC. - Mammogram of the right breast on 11/12/2021: BI-RADS Category 1. - Based on family and personal history, recommended genetic testing.  She would like to think about it. - I will arrange for mammogram in April next year.  RTC 1 year for follow-up with repeat labs.  2.  Bone health: - DEXA scan on 12/22/2018 with T score -0.3, normal. - Continue calcium and vitamin D  supplements.    Derek Jack, MD 12/11/21 2:21 PM  [facility] [phone]   I, Thana Ates, am acting as a scribe for Dr. Derek Jack.  I, Derek Jack MD, have reviewed the above documentation for accuracy and completeness, and I agree with the above.

## 2021-12-11 NOTE — Patient Instructions (Addendum)
[facility] at [facility] Discharge Instructions  You were seen and examined today by Dr. Delton Coombes. Dr. Delton Coombes is a medical oncologist, meaning that he specializes in the treatment of cancer diagnoses. Dr. Delton Coombes discussed your past medical history, family history of cancers, and the events that led to you being here today.  You were referred to Dr. Delton Coombes by Dr. Moshe Cipro due to your history of breast cancer. Your recent mammogram was negative for any signs of cancer. This is great news. Please continue your yearly mammograms.  Due to your family history of cancers, please see a Dietitian.  Have a mammogram in April of next year and follow-up with Dr. Delton Coombes afterwards.  Follow-up as scheduled.   Thank you for choosing [facility] [place] at [facility] to provide your oncology and hematology care.  To afford each patient quality time with our provider, please arrive at least 15 minutes before your scheduled appointment time.   If you have a lab appointment with the [facility] please come in thru the Main Entrance and check in at the main information desk.  You need to re-schedule your appointment should you arrive 10 or more minutes late.  We strive to give you quality time with our providers, and arriving late affects you and other patients whose appointments are after yours.  Also, if you no show three or more times for appointments you may be dismissed from the clinic at the providers discretion.     Again, thank you for choosing [facility].  Our hope is that these requests will decrease the amount of time that you wait before being seen by our physicians.       _____________________________________________________________  Should you have questions after your visit to [facility], please contact our office at [phone] and follow the prompts.  Our office hours are 8:00 a.m. and 4:30 p.m. Monday  - Friday.  Please note that voicemails left after 4:00 p.m. may not be returned until the following business day.  We are closed weekends and major holidays.  You do have access to a nurse 24-7, just call the main number to the clinic [phone] and do not press any options, hold on the line and a nurse will answer the phone.    For prescription refill requests, have your pharmacy contact our office and allow 72 hours.    Due to Covid, you will need to wear a mask upon entering the hospital. If you do not have a mask, a mask will be given to you at the Main Entrance upon arrival. For doctor visits, patients may have 1 support person age 58 or older with them. For treatment visits, patients can not have anyone with them due to social distancing guidelines and our immunocompromised population.

## 2021-12-29 ENCOUNTER — Other Ambulatory Visit: Payer: Self-pay | Admitting: Family Medicine

## 2022-01-16 ENCOUNTER — Encounter: Payer: Self-pay | Admitting: Family Medicine

## 2022-01-16 ENCOUNTER — Ambulatory Visit (INDEPENDENT_AMBULATORY_CARE_PROVIDER_SITE_OTHER): Payer: Medicare Other | Admitting: Family Medicine

## 2022-01-16 VITALS — BP 138/82 | HR 80 | Resp 16 | Ht 63.5 in | Wt 243.1 lb

## 2022-01-16 DIAGNOSIS — E785 Hyperlipidemia, unspecified: Secondary | ICD-10-CM

## 2022-01-16 DIAGNOSIS — I10 Essential (primary) hypertension: Secondary | ICD-10-CM

## 2022-01-16 DIAGNOSIS — Z0000 Encounter for general adult medical examination without abnormal findings: Secondary | ICD-10-CM

## 2022-01-16 MED ORDER — AMLODIPINE BESYLATE 10 MG PO TABS: 10.0000 mg | ORAL_TABLET | Freq: Every day | ORAL | 3 refills | Status: DC

## 2022-01-16 NOTE — Patient Instructions (Addendum)
F/U in November, call if you need me before  Please get fasting lipid, cmp and EGFr five days before next visit  Continue all medications you take   It is important that you exercise regularly at least 30 minutes 5 times a week. If you develop chest pain, have severe difficulty breathing, or feel very tired, stop exercising immediately and seek medical attention   Think about what you will eat, plan ahead. Choose " clean, green, fresh or frozen" over canned, processed or packaged foods which are more sugary, salty and fatty. 70 to 75% of food eaten should be vegetables and fruit. Three meals at set times with snacks allowed between meals, but they must be fruit or vegetables. Aim to eat over a 12 hour period , example 7 am to 7 pm, and STOP after  your last meal of the day. Drink water,generally about 64 ounces per day, no other drink is as healthy. Fruit juice is best enjoyed in a healthy way, by EATING the fruit. Thanks for choosing [facility], we consider it a privelige to serve you.

## 2022-01-20 ENCOUNTER — Encounter: Payer: Self-pay | Admitting: Family Medicine

## 2022-01-20 DIAGNOSIS — Z0001 Encounter for general adult medical examination with abnormal findings: Secondary | ICD-10-CM | POA: Insufficient documentation

## 2022-01-20 DIAGNOSIS — Z0000 Encounter for general adult medical examination without abnormal findings: Secondary | ICD-10-CM | POA: Insufficient documentation

## 2022-01-20 NOTE — Assessment & Plan Note (Signed)

## 2022-01-20 NOTE — Progress Notes (Signed)
    Heather [name]     MRN: [id number]      DOB: 1940/10/27  HPI: Patient is in for annual physical exam. No other health concerns are expressed or addressed at the visit. Recent labs,  are reviewed. Immunization is reviewed , and  updated if needed.   PE: BP 138/82 (BP Location: Right Arm, Cuff Size: Large)   Pulse 80   Resp 16   Ht 5' 3.5" (1.613 m)   Wt 243 lb 1.9 oz (110.3 kg)   SpO2 96%   BMI 42.39 kg/m   Pleasant  female, alert and oriented x 3, in no cardio-pulmonary distress. Afebrile. HEENT No facial trauma or asymetry. Sinuses non tender.  Extra occullar muscles intact.. External ears normal, . Neck: supple, no adenopathy,JVD or thyromegaly.No bruits.  Chest: Clear to ascultation bilaterally.No crackles or wheezes. Non tender to palpation  Cardiovascular system; Heart sounds normal,  S1 and  S2 ,no S3.  No murmur, or thrill. Apical beat not displaced Peripheral pulses normal.  Abdomen: Soft, non tender,   Musculoskeletal exam: Full ROM of spine, hips , shoulders and knees.  deformity ,swelling and  crepitus noted.in lknees No muscle wasting or atrophy.   Neurologic: Cranial nerves 2 to 12 intact. Power, tone ,sensation and reflexes normal throughout. No disturbance in gait. No tremor.  Skin: Intact, no ulceration, erythema , scaling or rash noted. Pigmentation normal throughout  Psych; Normal mood and affect. Judgement and concentration normal   Assessment & Plan:  Encounter for annual health examination Annual exam as documented. Counseling done  re healthy lifestyle involving commitment to 150 minutes exercise per week, heart healthy diet, and attaining healthy weight.The importance of adequate sleep also discussed. Regular seat belt use and home safety, is also discussed. Changes in health habits are decided on by the patient with goals and time frames  set for achieving them. Immunization and cancer screening needs are specifically  addressed at this visit.

## 2022-01-29 ENCOUNTER — Telehealth (HOSPITAL_COMMUNITY): Payer: Medicare Other | Admitting: Licensed Clinical Social Worker

## 2022-03-09 ENCOUNTER — Ambulatory Visit (INDEPENDENT_AMBULATORY_CARE_PROVIDER_SITE_OTHER): Payer: Medicare Other

## 2022-03-09 DIAGNOSIS — Z0000 Encounter for general adult medical examination without abnormal findings: Secondary | ICD-10-CM | POA: Diagnosis not present

## 2022-03-09 NOTE — Progress Notes (Signed)
I connected with  Heather [name] on 03/09/22 by a audio enabled telemedicine application and verified that I am speaking with the correct person using two identifiers.  Patient Location: Home  Provider Location: Office/Clinic  I discussed the limitations of evaluation and management by telemedicine. The patient expressed understanding and agreed to proceed.  Subjective:   Heather [name] is a 81 y.o. female who presents for Medicare Annual (Subsequent) preventive examination.  Review of Systems     Cardiac Risk Factors include: advanced age (>34mn, >>62women);dyslipidemia;hypertension     Objective:    There were no vitals filed for this visit. There is no height or weight on file to calculate BMI.     12/11/2021    1:44 PM 03/04/2021    3:15 PM 12/05/2018    2:01 PM 02/17/2018   10:26 AM 11/17/2017    1:36 PM 11/13/2016   10:20 AM 10/05/2016   10:45 AM  Advanced Directives  Does Patient Have a Medical Advance Directive? No Yes Yes Yes Yes Yes Yes  Type of Advance Directive  Living will Living will;[place]care [place]  Does patient want to make changes to medical advance directive?   No - Patient declined Yes (ED - Information included in AVS) No - Patient declined  No - Patient declined  [place] of [place]?   No - copy requested  No - copy requested    Would patient like information on creating a medical advance directive? No - Patient declined      Yes (MAU/Ambulatory/Procedural Areas - Information given)    Current Medications (verified) Outpatient Encounter Medications as of 03/09/2022  Medication Sig   amLODipine (NORVASC) 10 MG tablet Take 1 tablet (10 mg total) by mouth daily.   cholecalciferol (VITAMIN D) 400 UNITS TABS tablet Take 400 Units by mouth daily.   losartan-hydrochlorothiazide (HYZAAR) 100-12.5 MG tablet Take 1 tablet by mouth daily.   potassium chloride SA  (KLOR-CON M) 20 MEQ tablet TAKE ONE TABLET BY MOUTH TWICE A DAY   PROAIR HFA 108 (90 Base) MCG/ACT inhaler INHALE TWO PUFFS INTO THE LUNGS EVERY SIX HOURS AS NEEDED FOR WHEEZING OR SHORTNESS OF BREATH   rosuvastatin (CRESTOR) 5 MG tablet Take 1 tablet (5 mg total) by mouth daily.   No facility-administered encounter medications on file as of 03/09/2022.    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   Qualifier: Diagnosis of  By: FP[place]LPN, Kim  Left mastectomy  In 2000     Personal history of malignant neoplasm of breast    Past Surgical History:  Procedure Laterality Date   ABDOMINAL HYSTERECTOMY  1973   fibroids   BREAST SURGERY Left 1998   breast ca   FLEXIBLE SIGMOIDOSCOPY N/A 05/25/2016   Procedure: FLEXIBLE SIGMOIDOSCOPY;  Surgeon: SDanie Binder MD;  [facility];  Service: Endoscopy;  Laterality: N/A;  2:00 AM  MASTECTOMY  1997   left breast    TOTAL ABDOMINAL HYSTERECTOMY W/ BILATERAL SALPINGOOPHORECTOMY  1971   unilateral    VESICOVAGINAL FISTULA CLOSURE W/ TAH  1971   Family History  Problem Relation Age of Onset   Parkinsonism Mother    Prostate cancer Father    Kidney disease Sister        nephrectomy   Cancer Brother        throat   Coronary artery disease Other        Family History of males    Breast cancer Sister    Lung cancer Sister    Breast cancer Sister    Cervical cancer Sister    Social History   Socioeconomic History   Marital status: Widowed    Spouse name: Not on file   Number of children: 6    Years of education: Not on file   Highest education level: Some college, no degree  Occupational History   Occupation: disabled in [place] Use   Smoking status: Former    Years: 40.00    Types: Cigarettes    Quit date: 07/19/1998    Years since quitting: 23.6   Smokeless tobacco: Never  Substance and Sexual Activity   Alcohol use: No   Drug use: No   Sexual activity: Not Currently  Other Topics Concern   Not on file  Social History Narrative   Not on file   Social Determinants of Health   Financial Resource Strain: Low Risk  (03/09/2022)   Overall Financial Resource Strain (CARDIA)    Difficulty of Paying Living Expenses: Not hard at all  Food Insecurity: No Food Insecurity (03/09/2022)   Hunger Vital Sign    Worried About Running Out of Food in the Last Year: Never true    [place] in the Last Year: Never true  Transportation Needs: No Transportation Needs (03/04/2021)   PRAPARE - Hydrologist (Medical): No    Lack of Transportation (Non-Medical): No  Physical Activity: Insufficiently Active (03/09/2022)   Exercise Vital Sign    Days of Exercise per Week: 3 days    Minutes of Exercise per Session: 20 min  Stress: No Stress Concern Present (03/04/2021)   [place]    Feeling of Stress : Not at all  Social Connections: Moderately Isolated (03/09/2022)   Social Connection and Isolation Panel [NHANES]    Frequency of Communication with Friends and Family: More than three times a week    Frequency of Social Gatherings with Friends and Family: More than three times a week    Attends Religious Services: More than 4 times per year    Active Member of [employer] or Organizations: No    Attends Archivist Meetings: Never    Marital Status: Widowed    Tobacco Counseling Counseling given: Not Answered   Clinical Intake:                 Diabetic?          Activities of Daily Living    03/09/2022   10:22 AM  In your present state of health, do you have any difficulty performing the following activities:  Hearing? 0  Vision? 0  Difficulty concentrating or making decisions? 0  Walking or climbing stairs? 0  Dressing or bathing? 0  Doing errands, shopping? 0  Preparing Food and eating ? N  Using the  Toilet? N  In the past six months, have you accidently leaked urine? N  Do you have problems with loss of bowel control? N  Managing your Medications? N  Managing your Finances? N  Housekeeping or managing your Housekeeping? N    Patient Care Team: Fayrene Helper, MD as PCP - General Kef[place]as, Manon Hilding, PA-C (Inactive) as Physician Assistant (Oncology) Derek Jack, MD as Medical Oncologist (Medical Oncology)  Indicate any recent Medical Services you may have received from other than [facility] providers in the past year (date may be approximate).     Assessment:   This is a routine wellness examination for [facility].  Hearing/Vision screen No results found.  Dietary issues and exercise activities discussed:     Goals Addressed   None    Depression Screen    01/16/2022    9:49 AM 03/04/2021    3:09 PM 01/07/2021   10:13 AM 12/25/2019    8:40 AM 09/20/2019    3:54 PM 08/30/2019    3:57 PM 05/23/2019    3:44 PM  PHQ 2/9 Scores  PHQ - 2 Score 0 0 0 0 0 0 0    Fall Risk    03/09/2022   10:22 AM 01/16/2022    9:47 AM 11/12/2021    3:07 PM 03/04/2021    3:15 PM 01/07/2021   10:12 AM  Fall Risk   Falls in the past year? 0 1 0 0 0  Number falls in past yr: 0 0 0 0 0  Injury with Fall? 0 0 0 0 0  Risk for fall due to :  Impaired balance/gait  No Fall Risks No Fall Risks  Follow up    Falls evaluation completed Falls evaluation completed    Many:  Any stairs in or around the home? No  If so, are there any without handrails? No  Home free of loose throw rugs in walkways, pet  beds, electrical cords, etc? Yes  Adequate lighting in your home to reduce risk of falls? Yes   ASSISTIVE DEVICES UTILIZED TO PREVENT FALLS:  Life alert? No  Use of a cane, walker or w/c? No  Grab bars in the bathroom? No  Shower chair or bench in shower? No  Elevated toilet seat or a handicapped toilet? No    Cognitive Function:        03/04/2021    3:17 PM 12/25/2019    8:34 AM 02/17/2018   10:36 AM 02/17/2018   10:29 AM 10/05/2016   10:49 AM  6CIT Screen  What Year? 0 points  0 points 0 points 0 points  What month? 0 points  0 points 0 points 0 points  What time? 0 points 0 points 0 points 0 points 0 points  Count back from 20 0 points 0 points 0 points  0 points  Months in reverse 0 points 0 points 0 points  0 points  Repeat phrase 0 points 0 points 0 points  0 points  Total Score 0 points  0 points  0 points    Immunizations Immunization History  Administered Date(s) Administered   Fluad Quad(high Dose 65+) 05/23/2019   Influenza Whole 05/06/2006, 05/25/2007, 05/06/2009, 03/27/2010   Influenza, High Dose Seasonal PF 08/09/2018   Influenza,inj,Quad PF,6+ Mos 04/12/2013, 05/30/2014, 05/09/2015, 05/28/2016   Moderna Sars-Covid-2 Vaccination 09/27/2019, 10/25/2019   Pneumococcal Conjugate-13 11/11/2017   Pneumococcal Polysaccharide-23 02/14/2007, 01/30/2016   Td 02/14/2007    TDAP status: Due, Education has  been provided regarding the importance of this vaccine. Advised may receive this vaccine at local pharmacy or Health Dept. Aware to provide a copy of the vaccination record if obtained from local pharmacy or Health Dept. Verbalized acceptance and understanding.  Flu Vaccine status: Due, Education has been provided regarding the importance of this vaccine. Advised may receive this vaccine at local pharmacy or Health Dept. Aware to provide a copy of the vaccination record if obtained from local pharmacy or Health Dept. Verbalized acceptance and understanding.  Pneumococcal  vaccine status: Up to date  Covid-19 vaccine status: Completed vaccines  Qualifies for Shingles Vaccine? No   Zostavax completed No   Shingrix Completed?: No.    Education has been provided regarding the importance of this vaccine. Patient has been advised to call insurance company to determine out of pocket expense if they have not yet received this vaccine. Advised may also receive vaccine at local pharmacy or Health Dept. Verbalized acceptance and understanding.  Screening Tests Health Maintenance  Topic Date Due   Zoster Vaccines- Shingrix (1 of 2) Never done   TETANUS/TDAP  02/13/2017   COVID-19 Vaccine (3 - Moderna risk series) 11/22/2019   INFLUENZA VACCINE  02/17/2022   Pneumonia Vaccine 64+ Years old  Completed   DEXA SCAN  Completed   HPV VACCINES  Aged Out    Health Maintenance  Health Maintenance Due  Topic Date Due   Zoster Vaccines- Shingrix (1 of 2) Never done   TETANUS/TDAP  02/13/2017   COVID-19 Vaccine (3 - Moderna risk series) 11/22/2019   INFLUENZA VACCINE  02/17/2022    Colorectal cancer screening: Type of screening: Colonoscopy. Completed 2018. Repeat every 10 years  Mammogram status: Completed  . Repeat every year  Bone Density status: Completed  . Results reflect: Bone density results: OSTEOPENIA. Repeat every   years.  Lung Cancer Screening: (Low Dose CT Chest recommended if Age 4-80 years, 30 pack-year currently smoking OR have quit w/in 15years.) does not qualify.   Lung Cancer Screening Referral: na  Additional Screening:  Hepatitis C Screening: does not qualify; Completed   Vision Screening: Recommended annual ophthalmology exams for early detection of glaucoma and other disorders of the eye. Is the patient up to date with their annual eye exam?  Yes  Who is the provider or what is the name of the office in which the patient attends annual eye exams?  If pt is not established with a provider, would they like to be referred to a provider to  establish care? No .   Dental Screening: Recommended annual dental exams for proper oral hygiene  Community Resource Referral / Chronic Care Management: CRR required this visit?  No   CCM required this visit?  No      Plan:     I have personally reviewed and noted the following in the patient's chart:   Medical and social history Use of alcohol, tobacco or illicit drugs  Current medications and supplements including opioid prescriptions. Patient is not currently taking opioid prescriptions. Functional ability and status Nutritional status Physical activity Advanced directives List of other physicians Hospitalizations, surgeries, and ER visits in previous 12 months Vitals Screenings to include cognitive, depression, and falls Referrals and appointments  In addition, I have reviewed and discussed with patient certain preventive protocols, quality metrics, and best practice recommendations. A written personalized care plan for preventive services as well as general preventive health recommendations were provided to patient.     Eual Fines, LPN  03/09/2022   Nurse Notes: Schedule your next wellness visit for 1 year at checkout

## 2022-03-09 NOTE — Patient Instructions (Signed)
  Ms. Kemnitz , Thank you for taking time to come for your Medicare Wellness Visit. I appreciate your ongoing commitment to your health goals. Please review the following plan we discussed and let me know if I can assist you in the future.   These are the goals we discussed:  Goals      Increase physical activity     Start walking 5 days a week for 30 minutes, 5 days a week      Increase water intake     Recommend increasing water intake to 8, 8 ounce glasses a day (64 ounces).     LIFESTYLE - DECREASE FALLS RISK        This is a list of the screening recommended for you and due dates:  Health Maintenance  Topic Date Due   Zoster (Shingles) Vaccine (1 of 2) Never done   Tetanus Vaccine  02/13/2017   COVID-19 Vaccine (3 - Moderna risk series) 11/22/2019   Flu Shot  02/17/2022   Pneumonia Vaccine  Completed   DEXA scan (bone density measurement)  Completed   HPV Vaccine  Aged Out

## 2022-05-29 DIAGNOSIS — E785 Hyperlipidemia, unspecified: Secondary | ICD-10-CM | POA: Diagnosis not present

## 2022-05-29 DIAGNOSIS — I10 Essential (primary) hypertension: Secondary | ICD-10-CM | POA: Diagnosis not present

## 2022-05-30 LAB — CMP14+EGFR
ALT: 7 IU/L (ref 0–32)
AST: 12 IU/L (ref 0–40)
Albumin/Globulin Ratio: 1.4 (ref 1.2–2.2)
Albumin: 4 g/dL (ref 3.7–4.7)
Alkaline Phosphatase: 91 IU/L (ref 44–121)
BUN/Creatinine Ratio: 14 (ref 12–28)
BUN: 14 mg/dL (ref 8–27)
Bilirubin Total: 0.4 mg/dL (ref 0.0–1.2)
CO2: 26 mmol/L (ref 20–29)
Calcium: 9.2 mg/dL (ref 8.7–10.3)
Chloride: 102 mmol/L (ref 96–106)
Creatinine, Ser: 0.99 mg/dL (ref 0.57–1.00)
Globulin, Total: 2.9 g/dL (ref 1.5–4.5)
Glucose: 89 mg/dL (ref 70–99)
Potassium: 4.7 mmol/L (ref 3.5–5.2)
Sodium: 141 mmol/L (ref 134–144)
Total Protein: 6.9 g/dL (ref 6.0–8.5)
eGFR: 57 mL/min/{1.73_m2} — ABNORMAL LOW (ref >59)

## 2022-05-30 LAB — LIPID PANEL
Chol/HDL Ratio: 2.6 ratio (ref 0.0–4.4)
Cholesterol, Total: 130 mg/dL (ref 100–199)
HDL: 50 mg/dL (ref >39)
LDL Chol Calc (NIH): 66 mg/dL (ref 0–99)
Triglycerides: 65 mg/dL (ref 0–149)
VLDL Cholesterol Cal: 14 mg/dL (ref 5–40)

## 2022-06-03 ENCOUNTER — Ambulatory Visit: Payer: Medicare Other | Admitting: Family Medicine

## 2022-08-07 ENCOUNTER — Ambulatory Visit (INDEPENDENT_AMBULATORY_CARE_PROVIDER_SITE_OTHER): Payer: Medicare Other | Admitting: Family Medicine

## 2022-08-07 ENCOUNTER — Encounter: Payer: Self-pay | Admitting: Family Medicine

## 2022-08-07 VITALS — BP 130/84 | HR 76 | Ht 64.0 in | Wt 242.0 lb

## 2022-08-07 DIAGNOSIS — R7303 Prediabetes: Secondary | ICD-10-CM | POA: Diagnosis not present

## 2022-08-07 DIAGNOSIS — E559 Vitamin D deficiency, unspecified: Secondary | ICD-10-CM

## 2022-08-07 DIAGNOSIS — I10 Essential (primary) hypertension: Secondary | ICD-10-CM

## 2022-08-07 DIAGNOSIS — J4540 Moderate persistent asthma, uncomplicated: Secondary | ICD-10-CM

## 2022-08-07 NOTE — Patient Instructions (Addendum)
Annual exam mid July , call if you need me sooner  Fasting CBC, lipid, cmp and EGFr, TSH and vit D 3 to 5 days before next appointment  Keep moving so knees do not get stiff and hurt too much   Continue to enjoy greens, and stay away from sweets , sugar , and fried and fatty foods  Thanks for choosing [facility], we consider it a privelige to serve you.   \I recommend TdAP and shingrix vaccines, both are at the pharmacy

## 2022-08-10 ENCOUNTER — Encounter: Payer: Self-pay | Admitting: Family Medicine

## 2022-08-10 DIAGNOSIS — E559 Vitamin D deficiency, unspecified: Secondary | ICD-10-CM | POA: Insufficient documentation

## 2022-08-10 NOTE — Assessment & Plan Note (Signed)
Controlled, no change in medication DASH diet and commitment to daily physical activity for a minimum of 30 minutes discussed and encouraged, as a part of hypertension management. The importance of attaining a healthy weight is also discussed.     08/07/2022   10:00 AM 08/07/2022    9:31 AM 01/16/2022    9:57 AM 01/16/2022    9:44 AM 12/11/2021    1:35 PM 11/12/2021    3:40 PM 11/12/2021    3:07 PM  BP/Weight  Systolic BP 301 601 093 235 573 220 254  Diastolic BP 84 86 82 80 69 100 68  Wt. (Lbs)  242  243.12 248.3  252.4  BMI  41.54 kg/m2  42.39 kg/m2 42.62 kg/m2  43.32 kg/m2

## 2022-08-10 NOTE — Assessment & Plan Note (Signed)
  Patient re-educated about  the importance of commitment to a  minimum of 150 minutes of exercise per week as able.  The importance of healthy food choices with portion control discussed, as well as eating regularly and within a 12 hour window most days. The need to choose "clean , green" food 50 to 75% of the time is discussed, as well as to make water the primary drink and set a goal of 64 ounces water daily.       08/07/2022    9:31 AM 01/16/2022    9:44 AM 12/11/2021    1:35 PM  Weight /BMI  Weight 242 lb 243 lb 1.9 oz 248 lb 4.8 oz  Height '5\' 4"'$  (1.626 m) 5' 3.5" (1.613 m) '5\' 4"'$  (1.626 m)  BMI 41.54 kg/m2 42.39 kg/m2 42.62 kg/m2

## 2022-08-10 NOTE — Assessment & Plan Note (Signed)
Updated lab needed at/ before next visit.   

## 2022-08-10 NOTE — Assessment & Plan Note (Signed)
Controlled, no change in medication  

## 2022-08-10 NOTE — Progress Notes (Signed)
Heather [name]     MRN: [id number]      DOB: 19-Jun-1941   HPI Heather [name] is here for follow up and re-evaluation of chronic medical conditions, medication management and review of any available recent lab and radiology data.  Preventive health is updated, specifically  Cancer screening and Immunization.  Holding off on flu vaccine today states she will get it next week  The PT denies any adverse reactions to current medications since the last visit.  There are no new concerns.  There are no specific complaints   ROS Denies recent fever or chills. Denies sinus pressure, nasal congestion, ear pain or sore throat. Denies chest congestion, productive cough or wheezing. Denies chest pains, palpitations and leg swelling Denies abdominal pain, nausea, vomiting,diarrhea or constipation.   Denies dysuria, frequency, hesitancy or incontinence. C/o  joint pain, , stiffness  and mild limitation in mobility. Denies headaches, seizures, numbness, or tingling. Denies depression, anxiety or insomnia. Denies skin break down or rash.   PE  BP 130/84   Pulse 76   Ht '5\' 4"'$  (1.626 m)   Wt 242 lb (109.8 kg)   SpO2 98%   BMI 41.54 kg/m   Patient alert and oriented and in no cardiopulmonary distress.  HEENT: No facial asymmetry, EOMI,     Neck supple .  Chest: Clear to auscultation bilaterally.  CVS: S1, S2 no murmurs, no S3.Regular rate.  ABD: Soft non tender.   Ext: No edema  MS: decreased  ROM spine, shoulders, hips and knees.  Skin: Intact, no ulcerations or rash noted.  Psych: Good eye contact, normal affect. Memory intact not anxious or depressed appearing.  CNS: CN 2-12 intact, power,  normal throughout.no focal deficits noted.   Assessment & Plan  Hypertension Controlled, no change in medication DASH diet and commitment to daily physical activity for a minimum of 30 minutes discussed and encouraged, as a part of hypertension management. The importance of attaining a  healthy weight is also discussed.     08/07/2022   10:00 AM 08/07/2022    9:31 AM 01/16/2022    9:57 AM 01/16/2022    9:44 AM 12/11/2021    1:35 PM 11/12/2021    3:40 PM 11/12/2021    3:07 PM  BP/Weight  Systolic BP 932 355 732 202 542 706 237  Diastolic BP 84 86 82 80 69 100 68  Wt. (Lbs)  242  243.12 248.3  252.4  BMI  41.54 kg/m2  42.39 kg/m2 42.62 kg/m2  43.32 kg/m2       MORBID OBESITY  Patient re-educated about  the importance of commitment to a  minimum of 150 minutes of exercise per week as able.  The importance of healthy food choices with portion control discussed, as well as eating regularly and within a 12 hour window most days. The need to choose "clean , green" food 50 to 75% of the time is discussed, as well as to make water the primary drink and set a goal of 64 ounces water daily.       08/07/2022    9:31 AM 01/16/2022    9:44 AM 12/11/2021    1:35 PM  Weight /BMI  Weight 242 lb 243 lb 1.9 oz 248 lb 4.8 oz  Height '5\' 4"'$  (1.626 m) 5' 3.5" (1.613 m) '5\' 4"'$  (1.626 m)  BMI 41.54 kg/m2 42.39 kg/m2 42.62 kg/m2      Prediabetes Patient educated about the importance of limiting  Carbohydrate intake ,  the need to commit to daily physical activity for a minimum of 30 minutes , and to commit weight loss. The fact that changes in all these areas will reduce or eliminate all together the development of diabetes is stressed.      Latest Ref Rng & Units 05/29/2022    9:26 AM 11/19/2021   10:16 AM 01/07/2021   11:30 AM 09/26/2019    9:47 AM 12/05/2018    1:45 PM  Diabetic Labs  HbA1c 4.8 - 5.6 %  5.6  5.6     Chol 100 - 199 mg/dL 130  210  193  182    HDL >39 mg/dL 50  47  50  45    Calc LDL 0 - 99 mg/dL 66  149  130  120    Triglycerides 0 - 149 mg/dL 65  79  69  73    Creatinine 0.57 - 1.00 mg/dL 0.99  1.05  0.96  0.93  1.01       08/07/2022   10:00 AM 08/07/2022    9:31 AM 01/16/2022    9:57 AM 01/16/2022    9:44 AM 12/11/2021    1:35 PM 11/12/2021    3:40 PM  11/12/2021    3:07 PM  BP/Weight  Systolic BP 811 031 594 585 929 244 628  Diastolic BP 84 86 82 80 69 100 68  Wt. (Lbs)  242  243.12 248.3  252.4  BMI  41.54 kg/m2  42.39 kg/m2 42.62 kg/m2  43.32 kg/m2       No data to display          Updated lab needed at/ before next visit.   Asthma Controlled, no change in medication   Vitamin D deficiency Updated lab needed at/ before next visit.

## 2022-08-10 NOTE — Assessment & Plan Note (Signed)
Patient educated about the importance of limiting  Carbohydrate intake , the need to commit to daily physical activity for a minimum of 30 minutes , and to commit weight loss. The fact that changes in all these areas will reduce or eliminate all together the development of diabetes is stressed.      Latest Ref Rng & Units 05/29/2022    9:26 AM 11/19/2021   10:16 AM 01/07/2021   11:30 AM 09/26/2019    9:47 AM 12/05/2018    1:45 PM  Diabetic Labs  HbA1c 4.8 - 5.6 %  5.6  5.6     Chol 100 - 199 mg/dL 130  210  193  182    HDL >39 mg/dL 50  47  50  45    Calc LDL 0 - 99 mg/dL 66  149  130  120    Triglycerides 0 - 149 mg/dL 65  79  69  73    Creatinine 0.57 - 1.00 mg/dL 0.99  1.05  0.96  0.93  1.01       08/07/2022   10:00 AM 08/07/2022    9:31 AM 01/16/2022    9:57 AM 01/16/2022    9:44 AM 12/11/2021    1:35 PM 11/12/2021    3:40 PM 11/12/2021    3:07 PM  BP/Weight  Systolic BP 520 802 233 612 244 975 300  Diastolic BP 84 86 82 80 69 100 68  Wt. (Lbs)  242  243.12 248.3  252.4  BMI  41.54 kg/m2  42.39 kg/m2 42.62 kg/m2  43.32 kg/m2       No data to display          Updated lab needed at/ before next visit.

## 2022-08-28 ENCOUNTER — Other Ambulatory Visit: Payer: Self-pay

## 2022-08-28 DIAGNOSIS — E785 Hyperlipidemia, unspecified: Secondary | ICD-10-CM

## 2022-09-08 ENCOUNTER — Telehealth: Payer: Self-pay | Admitting: Family Medicine

## 2022-09-08 ENCOUNTER — Other Ambulatory Visit: Payer: Self-pay

## 2022-09-08 NOTE — Telephone Encounter (Signed)
Patient called in stating that she is taking all current meds.   Wants a call back in regard.

## 2022-09-08 NOTE — Telephone Encounter (Signed)
Spoke with patient. She does not know what she needs refilled. Will let us know when she can.

## 2022-09-11 ENCOUNTER — Other Ambulatory Visit: Payer: Self-pay

## 2022-09-11 ENCOUNTER — Telehealth: Payer: Self-pay | Admitting: Family Medicine

## 2022-09-11 MED ORDER — ROSUVASTATIN CALCIUM 5 MG PO TABS: 5.0000 mg | ORAL_TABLET | Freq: Every day | ORAL | 5 refills | Status: DC

## 2022-09-11 NOTE — Telephone Encounter (Signed)
Refills sent to pharmacy. 

## 2022-09-11 NOTE — Telephone Encounter (Signed)
Patient needs refill on   rosuvastatin (CRESTOR) 5 MG tablet   [facility] - [place], [place] [address] [place], [place] [address] Phone: [phone]  Fax: [phone]

## 2022-10-26 ENCOUNTER — Other Ambulatory Visit: Payer: Self-pay | Admitting: Family Medicine

## 2022-11-16 ENCOUNTER — Ambulatory Visit (HOSPITAL_COMMUNITY)
Admission: RE | Admit: 2022-11-16 | Discharge: 2022-11-16 | Disposition: A | Discharge status: Home / Self Care | Payer: Medicare Other | Source: Ambulatory Visit | Attending: Hematology | Admitting: Hematology

## 2022-11-16 ENCOUNTER — Encounter (HOSPITAL_COMMUNITY): Payer: Self-pay

## 2022-11-16 ENCOUNTER — Inpatient Hospital Stay: Payer: Medicare Other | Attending: Hematology

## 2022-11-16 DIAGNOSIS — Z853 Personal history of malignant neoplasm of breast: Secondary | ICD-10-CM

## 2022-11-16 DIAGNOSIS — C50912 Malignant neoplasm of unspecified site of left female breast: Secondary | ICD-10-CM | POA: Diagnosis not present

## 2022-11-16 DIAGNOSIS — Z1231 Encounter for screening mammogram for malignant neoplasm of breast: Secondary | ICD-10-CM | POA: Diagnosis not present

## 2022-11-20 NOTE — Progress Notes (Deleted)
[place] [name], [place] [id number]   CLINIC:  Medical Oncology/Hematology  PCP:  Heather [name], Heather [name] [address] / [place] [id number] [phone]   REASON FOR VISIT: Long-term survivorship follow-up for history of left-sided breast cancer (stage IIb, ER/PR negative)  PRIOR THERAPY: - Modified radical mastectomy (05/30/1996) - CMF chemotherapy x 6 cycles - Radiation therapy (completed in September 1998)  CURRENT THERAPY: Surveillance  BRIEF ONCOLOGIC HISTORY:   Oncology History Overview Note  stage II B. 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 All therapy finished as of September 1998    History of Invasive medullary carcinoma of left breast  05/30/1996 Surgery   Modified radical mastectomy demonstrating a Stage IIB (T2N1M0) medullary carcinoma of left breast with 4/13 positive lymph nodes.  ER/PR negative.   08/07/1996 - 01/01/1997 Chemotherapy   CMFx 6 cycles   01/24/1997 - 04/16/1997 Radiation Therapy   Approximate start and completion dates.  A few breaks in radiation therapy due to desquamation     CANCER STAGING: Cancer Staging  History of Invasive medullary carcinoma of left breast Staging form: Breast, AJCC 6th Edition - Clinical: Stage IIB (T2, N1, M0) - Signed by Heather Newer, PA-C on 10/03/2013   INTERVAL HISTORY:   Heather [name], a 82 y.o. female, returns for long-term survivorship with history of left-sided breast cancer diagnosed in 1997. Heather [name] was last seen on 12/11/2021 by Dr. Ellin [name].   At today's visit, she  reports feeling ***.  She denies any recent hospitalizations, surgeries, or changes in her  baseline health status.  ***She denies any symptoms of recurrence such as new lumps, bone pain,  chest pain, dyspnea, or abdominal pain.  She has no new headaches, seizures, or focal neurologic deficits.  No B symptoms such as fever, chills, night sweats, unintentional weight loss.  She reports ***% energy and ***% appetite.  She is maintaining stable weight at this time.   ASSESSMENT & PLAN:  1.  History of stage IIb left breast medullary carcinoma (1997) - Status post left modified radical mastectomy on 05/30/1996, 4 out of 13 lymph nodes positive, ER/PR negative. - Status post CMF x6 cycles followed by radiation therapy (completed in September 1998)  - Physical examination today shows left mastectomy site is within normal limits. Right breast has no palpable masses.*** - Mammogram, unilateral right breast (11/16/2022): BI-RADS Category 1, negative - No clinical signs or symptoms of recurrence.*** - Labs *** - PLAN: Mammogram, labs, and office visit in 1 year *** versus discharge to PCP ***  2.  Cancer risk - Maternal grandmother had unspecified cancer and "bled to death" - 1 sister with breast and lung cancer - 1 sister with breast and ovarian cancer - 1 sister with brain cancer - 1 brother with throat cancer - Patient diagnosed with breast cancer at age 77.  She had menarche at 7 with hysterectomy at age 8.  She had 6 pregnancies, first child born at age of 75.  She never received birth control pills.  She was never exposed to fertility medications or hormone replacement therapy. - PLAN: Based on family and personal history of cancer, genetic testing was recommended by Dr. Ellin [name].  Patient ***.  3.   Bone health: - DEXA scan on 12/22/2018 with  T score -0.3, normal. - She takes calcium and vitamin D supplements.   4.  Social/family history: - She lives at home mostly by herself.  Her son lives with her occasionally.  Daughter checks on her every day.  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  Quit smoking more than 10 years ago.  She smoked 1 pack/week for  many years. - Father had prostate cancer and maternal grandmother had cancer.  Brother had throat cancer.  1 sister had breast cancer and another sister lung and third sister ovarian cancer.  PLAN SUMMARY: >> *** >> *** >> ***   REVIEW OF SYSTEMS: ***  Review of Systems - Oncology  PHYSICAL EXAM:   Performance status (ECOG): {CHL ONC WU:[id number]} *** There were no vitals filed for this visit. Wt Readings from Last 3 Encounters:  08/07/22 242 lb (109.8 kg)  01/16/22 243 lb 1.9 oz (110.3 kg)  12/11/21 248 lb 4.8 oz (112.6 kg)   Physical Exam   PAST MEDICAL/SURGICAL HISTORY:  Past Medical History:  Diagnosis Date   Annual physical exam 05/30/2014   Asthma    Breast cancer (HCC) 1998   left   Congestive heart failure (HCC)    FATIGUE 09/20/2008   Qualifier: Diagnosis of  By: Heather [name] Heather [name], Margaret     FH: CAD (coronary artery disease)    Gallstones 05/30/2014   GERD (gastroesophageal reflux disease)    Heart failure    Hypertension 1998   Invasive medullary carcinoma of left breast 07/29/2007   Qualifier: Diagnosis of  By: [place] LPN, Heather [name]  Left mastectomy  In 2000     Personal history of malignant neoplasm of breast    Past Surgical History:  Procedure Laterality Date   ABDOMINAL HYSTERECTOMY  1973   fibroids   BREAST SURGERY Left 1998   breast ca   FLEXIBLE SIGMOIDOSCOPY N/A 05/25/2016   Procedure: FLEXIBLE SIGMOIDOSCOPY;  Surgeon: [place] Bali, Heather [name];  Location: [facility];  Service: Endoscopy;  Laterality: N/A;  2:00 AM   MASTECTOMY  1997   left breast    TOTAL ABDOMINAL HYSTERECTOMY W/ BILATERAL SALPINGOOPHORECTOMY  1971   unilateral    VESICOVAGINAL FISTULA CLOSURE W/ TAH  1971    SOCIAL HISTORY:  Social History   Socioeconomic History   Marital status: Widowed    Spouse name: Not on file   Number of children: 6   Years of education: Not on file   Highest education level: Some college, no degree  Occupational History   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   Tobacco Use   Smoking status: Former    Years: 40    Types: Cigarettes    Quit date: 07/19/1998    Years since quitting: 24.3   Smokeless tobacco: Never  Substance and Sexual Activity   Alcohol use: No   Drug use: No   Sexual activity: Not Currently  Other Topics Concern   Not on file  Social History Narrative   Not on file   Social Determinants of Health   Financial Resource Strain: Low Risk  (03/09/2022)   Overall Financial Resource Strain (CARDIA)    Difficulty of Paying Living Expenses: Not hard at all  Food Insecurity: No Food Insecurity (03/09/2022)   Hunger Vital Sign    Worried About Running Out of Food in the Last Year: Never true    Ran Out of Food in the Last Year: Never true  Transportation Needs: No Transportation Needs (03/04/2021)  PRAPARE - Administrator, Civil Service (Medical): No    Lack of Transportation (Non-Medical): No  Physical Activity: Insufficiently Active (03/09/2022)   Exercise Vital Sign    Days of Exercise per Week: 3 days    Minutes of Exercise per Session: 20 min  Stress: No Stress Concern Present (03/04/2021)   [employer] of Occupational Health - Occupational Stress Questionnaire    Feeling of Stress : Not at all  Social Connections: Moderately Isolated (03/09/2022)   Social Connection and Isolation Panel [NHANES]    Frequency of Communication with Friends and Family: More than three times a week    Frequency of Social Gatherings with Friends and Family: More than three times a week    Attends Religious Services: More than 4 times per year    Active Member of [employer] or Organizations: No    Attends Banker Meetings: Never    Marital Status: Widowed  Intimate Partner Violence: Not At Risk (03/04/2021)   Humiliation, Afraid, Rape, and Kick questionnaire    Fear of Current or Ex-Partner: No    Emotionally Abused: No    Physically Abused: No    Sexually Abused: No    FAMILY HISTORY:  Family History  Problem  Relation Age of Onset   Parkinsonism Mother    Prostate cancer Father    Kidney disease Sister        nephrectomy   Cancer Brother        throat   Coronary artery disease Other        Family History of males    Breast cancer Sister    Lung cancer Sister    Breast cancer Sister    Cervical cancer Sister     CURRENT MEDICATIONS:  Current Outpatient Medications  Medication Sig Dispense Refill   amLODipine (NORVASC) 10 MG tablet Take 1 tablet (10 mg total) by mouth daily. 90 tablet 3   cholecalciferol (VITAMIN D) 400 UNITS TABS tablet Take 400 Units by mouth daily.     losartan-hydrochlorothiazide (HYZAAR) 100-12.5 MG tablet Take 1 tablet by mouth daily. 90 tablet 3   potassium chloride SA (KLOR-CON M) 20 MEQ tablet TAKE ONE TABLET BY MOUTH TWICE A DAY 180 tablet 2   PROAIR HFA 108 (90 Base) MCG/ACT inhaler INHALE TWO PUFFS INTO THE LUNGS EVERY SIX HOURS AS NEEDED FOR WHEEZING OR SHORTNESS OF BREATH 8.5 g 1   rosuvastatin (CRESTOR) 5 MG tablet TAKE ONE TABLET (5MG  TOTAL) BY MOUTH DAILY 30 tablet 5   No current facility-administered medications for this visit.    ALLERGIES:  Allergies  Allergen Reactions   Aspirin Anaphylaxis and Swelling   Penicillins Swelling    Causes throat to swell Has patient had a PCN reaction causing immediate rash, facial/tongue/throat swelling, SOB or lightheadedness with hypotension: Yes Has patient had a PCN reaction causing severe rash involving mucus membranes or skin necrosis: No Has patient had a PCN reaction that required hospitalization No Has patient had a PCN reaction occurring within the last 10 years: No If all of the above answers are "NO", then may proceed with Cephalosporin use.     LABORATORY DATA:  I have reviewed the labs as listed.     Latest Ref Rng & Units 11/19/2021   10:16 AM 01/07/2021   11:30 AM 09/26/2019    9:47 AM  CBC  WBC 3.4 - 10.8 x10E3/uL 9.0  8.6  9.7   Hemoglobin 11.1 - 15.9 g/dL 16.1  09.6  12.6   Hematocrit 34.0  - 46.6 % 40.0  41.0  38.2   Platelets 150 - 450 x10E3/uL 363  378  368       Latest Ref Rng & Units 05/29/2022    9:26 AM 11/19/2021   10:16 AM 01/07/2021   11:30 AM  CMP  Glucose 70 - 99 mg/dL 89  97  89   BUN 8 - 27 mg/dL 14  20  15    Creatinine 0.57 - 1.00 mg/dL 1.61  0.96  0.45   Sodium 134 - 144 mmol/L 141  140  140   Potassium 3.5 - 5.2 mmol/L 4.7  4.5  4.7   Chloride 96 - 106 mmol/L 102  100  100   CO2 20 - 29 mmol/L 26  25  24    Calcium 8.7 - 10.3 mg/dL 9.2  9.7  9.4   Total Protein 6.0 - 8.5 g/dL 6.9  7.7  7.6   Total Bilirubin 0.0 - 1.2 mg/dL 0.4  0.4  0.4   Alkaline Phos 44 - 121 IU/L 91  93  92   AST 0 - 40 IU/L 12  12  11    ALT 0 - 32 IU/L 7  6  9      DIAGNOSTIC IMAGING:  I have independently reviewed the scans and discussed with the patient. MM 3D SCREEN BREAST UNI RIGHT  Result Date: 11/17/2022 CLINICAL DATA:  Screening. EXAM: DIGITAL SCREENING UNILATERAL RIGHT MAMMOGRAM WITH CAD AND TOMOSYNTHESIS TECHNIQUE: Right screening digital craniocaudal and mediolateral oblique mammograms were obtained. Right screening digital breast tomosynthesis was performed. The images were evaluated with computer-aided detection. COMPARISON:  Previous exam(s). ACR Breast Density Category b: There are scattered areas of fibroglandular density. FINDINGS: The patient has had a left mastectomy. There are no findings suspicious for malignancy. IMPRESSION: No mammographic evidence of malignancy. A result letter of this screening mammogram will be mailed directly to the patient. RECOMMENDATION: Screening mammogram in one year.  (Code:SM-R-52M) BI-RADS CATEGORY  1: Negative. Electronically Signed   By: Sande Brothers M.D.   On: 11/17/2022 11:59     WRAP UP:  All questions were answered. The patient knows to call the clinic with any problems, questions or concerns.  Medical decision making: ***  Time spent on visit: I spent {CHL ONC TIME VISIT - WUJWJ:[id number]} counseling the patient face to face.  The total time spent in the appointment was {CHL ONC TIME VISIT - OZHYQ:[id number]} and more than 50% was on counseling.  Carnella Gu[place], PA-C  ***

## 2022-11-23 ENCOUNTER — Ambulatory Visit: Payer: Medicare Other | Admitting: Physician Assistant

## 2022-11-23 ENCOUNTER — Inpatient Hospital Stay: Payer: Medicare Other | Attending: Physician Assistant | Admitting: Physician Assistant

## 2022-11-24 DIAGNOSIS — H25813 Combined forms of age-related cataract, bilateral: Secondary | ICD-10-CM | POA: Diagnosis not present

## 2022-12-30 ENCOUNTER — Other Ambulatory Visit: Payer: Self-pay

## 2022-12-30 NOTE — Progress Notes (Signed)
Attempted to contact patient regarding adherence to rosuvastatin. Patient did not have a VM set up and I was unable to leave a message.  Valeda Malm, Pharm.D. PGY-2 Ambulatory Care Pharmacy Resident

## 2023-01-25 DIAGNOSIS — E559 Vitamin D deficiency, unspecified: Secondary | ICD-10-CM | POA: Diagnosis not present

## 2023-01-25 DIAGNOSIS — I10 Essential (primary) hypertension: Secondary | ICD-10-CM | POA: Diagnosis not present

## 2023-01-25 DIAGNOSIS — E785 Hyperlipidemia, unspecified: Secondary | ICD-10-CM | POA: Diagnosis not present

## 2023-01-26 LAB — CBC WITH DIFFERENTIAL/PLATELET
Basophils Absolute: 0 10*3/uL (ref 0.0–0.2)
Basos: 1 %
EOS (ABSOLUTE): 0.1 10*3/uL (ref 0.0–0.4)
Eos: 2 %
Hematocrit: 38.2 % (ref 34.0–46.6)
Hemoglobin: 12.4 g/dL (ref 11.1–15.9)
Immature Grans (Abs): 0 10*3/uL (ref 0.0–0.1)
Immature Granulocytes: 0 %
Lymphocytes Absolute: 1.9 10*3/uL (ref 0.7–3.1)
Lymphs: 24 %
MCH: 30 pg (ref 26.6–33.0)
MCHC: 32.5 g/dL (ref 31.5–35.7)
MCV: 93 fL (ref 79–97)
Monocytes Absolute: 0.6 10*3/uL (ref 0.1–0.9)
Monocytes: 8 %
Neutrophils Absolute: 5.3 10*3/uL (ref 1.4–7.0)
Neutrophils: 65 %
Platelets: 326 10*3/uL (ref 150–450)
RBC: 4.13 x10E6/uL (ref 3.77–5.28)
RDW: 12.5 % (ref 11.7–15.4)
WBC: 8 10*3/uL (ref 3.4–10.8)

## 2023-01-26 LAB — TSH: TSH: 4.47 u[IU]/mL (ref 0.450–4.500)

## 2023-01-26 LAB — CMP14+EGFR
ALT: 6 IU/L (ref 0–32)
AST: 11 IU/L (ref 0–40)
Albumin: 3.9 g/dL (ref 3.7–4.7)
Alkaline Phosphatase: 88 IU/L (ref 44–121)
BUN/Creatinine Ratio: 14 (ref 12–28)
BUN: 12 mg/dL (ref 8–27)
Bilirubin Total: 0.4 mg/dL (ref 0.0–1.2)
CO2: 26 mmol/L (ref 20–29)
Calcium: 9.1 mg/dL (ref 8.7–10.3)
Chloride: 102 mmol/L (ref 96–106)
Creatinine, Ser: 0.86 mg/dL (ref 0.57–1.00)
Globulin, Total: 3.2 g/dL (ref 1.5–4.5)
Glucose: 93 mg/dL (ref 70–99)
Potassium: 4.6 mmol/L (ref 3.5–5.2)
Sodium: 141 mmol/L (ref 134–144)
Total Protein: 7.1 g/dL (ref 6.0–8.5)
eGFR: 68 mL/min/{1.73_m2} (ref >59)

## 2023-01-26 LAB — LIPID PANEL
Chol/HDL Ratio: 2.4 ratio (ref 0.0–4.4)
Cholesterol, Total: 127 mg/dL (ref 100–199)
HDL: 53 mg/dL (ref >39)
LDL Chol Calc (NIH): 60 mg/dL (ref 0–99)
Triglycerides: 67 mg/dL (ref 0–149)
VLDL Cholesterol Cal: 14 mg/dL (ref 5–40)

## 2023-01-26 LAB — VITAMIN D 25 HYDROXY (VIT D DEFICIENCY, FRACTURES): Vit D, 25-Hydroxy: 45.8 ng/mL (ref 30.0–100.0)

## 2023-01-29 ENCOUNTER — Encounter: Payer: Self-pay | Admitting: Family Medicine

## 2023-01-29 ENCOUNTER — Ambulatory Visit (INDEPENDENT_AMBULATORY_CARE_PROVIDER_SITE_OTHER): Payer: Medicare Other | Admitting: Family Medicine

## 2023-01-29 VITALS — BP 140/100 | HR 78 | Ht 64.0 in | Wt 238.0 lb

## 2023-01-29 DIAGNOSIS — M25532 Pain in left wrist: Secondary | ICD-10-CM

## 2023-01-29 DIAGNOSIS — E785 Hyperlipidemia, unspecified: Secondary | ICD-10-CM

## 2023-01-29 DIAGNOSIS — I1A0 Resistant hypertension: Secondary | ICD-10-CM

## 2023-01-29 DIAGNOSIS — Z0001 Encounter for general adult medical examination with abnormal findings: Secondary | ICD-10-CM

## 2023-01-29 DIAGNOSIS — Z8679 Personal history of other diseases of the circulatory system: Secondary | ICD-10-CM | POA: Diagnosis not present

## 2023-01-29 DIAGNOSIS — E559 Vitamin D deficiency, unspecified: Secondary | ICD-10-CM

## 2023-01-29 MED ORDER — CARVEDILOL 3.125 MG PO TABS: 3.1250 mg | ORAL_TABLET | Freq: Two times a day (BID) | ORAL | 3 refills | Status: DC

## 2023-01-29 NOTE — Patient Instructions (Addendum)
Please schedule wellness visit ay checkout,   F/U re evaluate blood pressure in 8 to 10 weeks, call if you need me sooner  Blood pressure is high,new additional medication is carvedilol one tablet two times daily, take 12 hours apart example 8  am and 8 pm, continue your other 2 medications  EKG in office today  You are referred to Cardiology in [place]  Please get cXR and Xray o fleft wrist next week  Pain in wrist is due to sprained tendon I believe ( tendinitis)cool compress , wrap/for support and time    Nurse pls arrange cologuard  Thanks for choosing  Primary Care, we consider it a privelige to serve you.

## 2023-01-30 ENCOUNTER — Encounter: Payer: Self-pay | Admitting: Family Medicine

## 2023-01-30 DIAGNOSIS — M25532 Pain in left wrist: Secondary | ICD-10-CM | POA: Insufficient documentation

## 2023-01-30 NOTE — Assessment & Plan Note (Signed)
  Patient re-educated about  the importance of commitment to a  minimum of 150 minutes of exercise per week as able.  The importance of healthy food choices with portion control discussed, as well as eating regularly and within a 12 hour window most days. The need to choose "clean , green" food 50 to 75% of the time is discussed, as well as to make water the primary drink and set a goal of 64 ounces water daily.       01/29/2023    9:09 AM 08/07/2022    9:31 AM 01/16/2022    9:44 AM  Weight /BMI  Weight 238 lb 0.6 oz 242 lb 243 lb 1.9 oz  Height 5\' 4"  (1.626 m) 5\' 4"  (1.626 m) 5' 3.5" (1.613 m)  BMI 40.86 kg/m2 41.54 kg/m2 42.39 kg/m2

## 2023-01-30 NOTE — Assessment & Plan Note (Signed)

## 2023-01-30 NOTE — Progress Notes (Signed)
Heather [name]     MRN: [id number]      DOB: 04-20-1941  Chief Complaint  Patient presents with   Annual Exam    CPE/ Stress at home     HPI: Patient is in for annual physical exam. Increased stress as 2 of her children as well as a granddaughter ae sick, 2 of hte 3 hospitalized,  the 3rd on the street with mental health problems. Recent labs,  are reviewed. Immunization is reviewed , and  needs to be updated, she is again remionded of this 2 week h/o left wrist tenderness, injured the wrist when catching her  10 monthold great grand daughter when sghe flipped in her hand while attempting to change her   PE: BP (!) 140/100   Pulse 78   Ht 5\' 4"  (1.626 m)   Wt 238 lb 0.6 oz (108 kg)   SpO2 95%   BMI 40.86 kg/m   Pleasant  female, alert and oriented x 3, in no cardio-pulmonary distress. Afebrile. HEENT No facial trauma or asymetry. Sinuses non tender.  Extra occullar muscles intact.. External ears normal, . Neck: supple, no adenopathy,JVD or thyromegaly.No bruits.  Chest: Clear to ascultation bilaterally.No crackles or wheezes. Non tender to palpation   Cardiovascular system; Heart sounds normal,  S1 and  S2 ,no S3.  No murmur, or thrill. EKG: sinus bradycardia , rate 57, no acute ischemia, no LVH Peripheral pulses normal.  Abdomen: Soft, non tender  Musculoskeletal exam: Decreased though adequate  ROM of spine, hips , shoulders and knees. deformity ,and  crepitus noted. No muscle wasting or atrophy.   Neurologic: Cranial nerves 2 to 12 intact. Power, tone ,sensation  normal throughout. . No tremor.  Skin: Intact, no ulceration, erythema , scaling or rash noted. Pigmentation normal throughout  Psych; Normal mood and affect. Judgement and concentration normal   Assessment & Plan:  Encounter for Medicare annual examination with abnormal findings Annual exam as documented. Counseling done  re healthy lifestyle involving commitment to 150 minutes  exercise per week, heart healthy diet, and attaining healthy weight.The importance of adequate sleep also discussed. Regular seat belt use and home safety, is also discussed. Changes in health habits are decided on by the patient with goals and time frames  set for achieving them. Immunization and cancer screening needs are specifically addressed at this visit.   History of CHF (congestive heart failure) Over 20 year h/o hypertension, past h/o cHF, needs updated echo and cardiology eval, no symptoms reported to suggest decompensated hart failure  MORBID OBESITY  Patient re-educated about  the importance of commitment to a  minimum of 150 minutes of exercise per week as able.  The importance of healthy food choices with portion control discussed, as well as eating regularly and within a 12 hour window most days. The need to choose "clean , green" food 50 to 75% of the time is discussed, as well as to make water the primary drink and set a goal of 64 ounces water daily.       01/29/2023    9:09 AM 08/07/2022    9:31 AM 01/16/2022    9:44 AM  Weight /BMI  Weight 238 lb 0.6 oz 242 lb 243 lb 1.9 oz  Height 5\' 4"  (1.626 m) 5\' 4"  (1.626 m) 5' 3.5" (1.613 m)  BMI 40.86 kg/m2 41.54 kg/m2 42.39 kg/m2      Hypertension Uncontrolled , obtaine updtaed EKG, refer cardiology for ecval , add coreg DASH  diet and commitment to daily physical activity for a minimum of 30 minutes discussed and encouraged, as a part of hypertension management. The importance of attaining a healthy weight is also discussed.     01/29/2023    9:49 AM 01/29/2023    9:10 AM 01/29/2023    9:09 AM 08/07/2022   10:00 AM 08/07/2022    9:31 AM 01/16/2022    9:57 AM 01/16/2022    9:44 AM  BP/Weight  Systolic BP 140 181 191 130 138 138 148  Diastolic BP 100 88 69 84 86 82 80  Wt. (Lbs)   238.04  242  243.12  BMI   40.86 kg/m2  41.54 kg/m2  42.39 kg/m2       Vitamin D deficiency Controlled on current regime , continue  same  Dyslipidemia Hyperlipidemia:Low fat diet discussed and encouraged.   Lipid Panel  Lab Results  Component Value Date   CHOL 127 01/25/2023   HDL 53 01/25/2023   LDLCALC 60 01/25/2023   TRIG 67 01/25/2023   CHOLHDL 2.4 01/25/2023     Controlled, no change in medication   Left wrist pain 2 week history , s/p indirect trauma, clinical impression is tendonitis, cool compress and tylenol as needed, short term support as in brace will also be helpful

## 2023-01-30 NOTE — Assessment & Plan Note (Signed)
Controlled on current regime , continue same 

## 2023-01-30 NOTE — Assessment & Plan Note (Signed)
Uncontrolled , obtaine updtaed EKG, refer cardiology for ecval , add coreg DASH diet and commitment to daily physical activity for a minimum of 30 minutes discussed and encouraged, as a part of hypertension management. The importance of attaining a healthy weight is also discussed.     01/29/2023    9:49 AM 01/29/2023    9:10 AM 01/29/2023    9:09 AM 08/07/2022   10:00 AM 08/07/2022    9:31 AM 01/16/2022    9:57 AM 01/16/2022    9:44 AM  BP/Weight  Systolic BP 140 181 191 130 138 138 148  Diastolic BP 100 88 69 84 86 82 80  Wt. (Lbs)   238.04  242  243.12  BMI   40.86 kg/m2  41.54 kg/m2  42.39 kg/m2

## 2023-01-30 NOTE — Assessment & Plan Note (Signed)
Hyperlipidemia:Low fat diet discussed and encouraged.   Lipid Panel  Lab Results  Component Value Date   CHOL 127 01/25/2023   HDL 53 01/25/2023   LDLCALC 60 01/25/2023   TRIG 67 01/25/2023   CHOLHDL 2.4 01/25/2023     Controlled, no change in medication

## 2023-01-30 NOTE — Assessment & Plan Note (Signed)
2 week history , s/p indirect trauma, clinical impression is tendonitis, cool compress and tylenol as needed, short term support as in brace will also be helpful

## 2023-01-30 NOTE — Assessment & Plan Note (Signed)
Over 20 year h/o hypertension, past h/o cHF, needs updated echo and cardiology eval, no symptoms reported to suggest decompensated hart failure

## 2023-02-02 ENCOUNTER — Ambulatory Visit (HOSPITAL_COMMUNITY)
Admission: RE | Admit: 2023-02-02 | Discharge: 2023-02-02 | Disposition: A | Discharge status: Home / Self Care | Payer: Medicare Other | Source: Ambulatory Visit | Attending: Family Medicine | Admitting: Family Medicine

## 2023-02-02 DIAGNOSIS — S6392XA Sprain of unspecified part of left wrist and hand, initial encounter: Secondary | ICD-10-CM | POA: Diagnosis not present

## 2023-02-02 DIAGNOSIS — I1A0 Resistant hypertension: Secondary | ICD-10-CM | POA: Diagnosis not present

## 2023-02-02 DIAGNOSIS — M19032 Primary osteoarthritis, left wrist: Secondary | ICD-10-CM | POA: Diagnosis not present

## 2023-02-02 DIAGNOSIS — I10 Essential (primary) hypertension: Secondary | ICD-10-CM | POA: Diagnosis not present

## 2023-02-02 DIAGNOSIS — Z8679 Personal history of other diseases of the circulatory system: Secondary | ICD-10-CM | POA: Diagnosis not present

## 2023-02-02 DIAGNOSIS — M25532 Pain in left wrist: Secondary | ICD-10-CM | POA: Diagnosis not present

## 2023-02-02 DIAGNOSIS — I700 Atherosclerosis of aorta: Secondary | ICD-10-CM | POA: Diagnosis not present

## 2023-03-23 ENCOUNTER — Encounter: Payer: Self-pay | Admitting: Pharmacist

## 2023-03-26 ENCOUNTER — Encounter: Payer: Self-pay | Admitting: Family Medicine

## 2023-03-26 ENCOUNTER — Ambulatory Visit (INDEPENDENT_AMBULATORY_CARE_PROVIDER_SITE_OTHER): Payer: Medicare Other | Admitting: Family Medicine

## 2023-03-26 VITALS — BP 172/82 | HR 65 | Ht 64.0 in | Wt 235.1 lb

## 2023-03-26 DIAGNOSIS — Z23 Encounter for immunization: Secondary | ICD-10-CM | POA: Diagnosis not present

## 2023-03-26 DIAGNOSIS — I1A0 Resistant hypertension: Secondary | ICD-10-CM

## 2023-03-26 DIAGNOSIS — E785 Hyperlipidemia, unspecified: Secondary | ICD-10-CM | POA: Diagnosis not present

## 2023-03-26 MED ORDER — AMLODIPINE BESYLATE 10 MG PO TABS: 10.0000 mg | ORAL_TABLET | Freq: Every day | ORAL | 3 refills | Status: DC

## 2023-03-26 MED ORDER — ROSUVASTATIN CALCIUM 5 MG PO TABS: 5.0000 mg | ORAL_TABLET | Freq: Every day | ORAL | 3 refills | Status: DC

## 2023-03-26 NOTE — Patient Instructions (Addendum)
F/U in mid January, call if you need me sooner  NEED to start the carvedilol  that was prescribed, blood pressure is high  Good you have appt with Cardiology, very important that you kleep it  Please get covid vaccine in next 2 weeks, at the pharmacy  After that get shingrix Vaccines and TdAP, all are at your pharmacy  Fasting lipid, cmp and eGFR  1 week before next appointment  It is important that you exercise regularly at least 30 minutes 5 times a week. If you develop chest pain, have severe difficulty breathing, or feel very tired, stop exercising immediately and seek medical attention   Thanks for choosing [facility], we consider it a privelige to serve you.

## 2023-03-30 ENCOUNTER — Encounter: Payer: Self-pay | Admitting: Family Medicine

## 2023-03-30 NOTE — Assessment & Plan Note (Signed)
Hyperlipidemia:Low fat diet discussed and encouraged.   Lipid Panel  Lab Results  Component Value Date   CHOL 127 01/25/2023   HDL 53 01/25/2023   LDLCALC 60 01/25/2023   TRIG 67 01/25/2023   CHOLHDL 2.4 01/25/2023

## 2023-03-30 NOTE — Assessment & Plan Note (Signed)
Morbid obesity linked with cancer, arthritis  Patient re-educated about  the importance of commitment to a  minimum of 150 minutes of exercise per week as able.  The importance of healthy food choices with portion control discussed, as well as eating regularly and within a 12 hour window most days. The need to choose "clean , green" food 50 to 75% of the time is discussed, as well as to make water the primary drink and set a goal of 64 ounces water daily.       03/26/2023    9:11 AM 01/29/2023    9:09 AM 08/07/2022    9:31 AM  Weight /BMI  Weight 235 lb 1.3 oz 238 lb 0.6 oz 242 lb  Height 5\' 4"  (1.626 m) 5\' 4"  (1.626 m) 5\' 4"  (1.626 m)  BMI 40.35 kg/m2 40.86 kg/m2 41.54 kg/m2    '2

## 2023-03-30 NOTE — Assessment & Plan Note (Signed)
Uncontrolled , has not  filled coreg which was prescribed at last visit DASH diet and commitment to daily physical activity for a minimum of 30 minutes discussed and encouraged, as a part of hypertension management. The importance of attaining a healthy weight is also discussed.     03/26/2023    9:21 AM 03/26/2023    9:11 AM 01/29/2023    9:49 AM 01/29/2023    9:10 AM 01/29/2023    9:09 AM 08/07/2022   10:00 AM 08/07/2022    9:31 AM  BP/Weight  Systolic BP 172 183 140 181 191 130 138  Diastolic BP 82 81 100 88 69 84 86  Wt. (Lbs)  235.08   238.04  242  BMI  40.35 kg/m2   40.86 kg/m2  41.54 kg/m2

## 2023-03-30 NOTE — Progress Notes (Signed)
Heather [name]     MRN: [id number]      DOB: [birth year]  Chief Complaint  Patient presents with   Hypertension    F/u   Immunizations    Pt. Request Flu shot as well as pneumonia immunizations today    HPI Heather [name] is here for follow up and re-evaluation of chronic medical conditions, medication management and review of any available recent lab and radiology data.  Preventive health is updated, specifically  Cancer screening and Immunization.   Has upcoming cardiology eval. The PT denies any adverse reactions to current medications since the last visit. Has not started coreg which was prescribed  ROS Denies recent fever or chills. Denies sinus pressure, nasal congestion, ear pain or sore throat. Denies chest congestion, productive cough or wheezing. Denies chest pains, palpitations and leg swelling Denies abdominal pain, nausea, vomiting,diarrhea or constipation.   Denies dysuria, frequency, hesitancy or incontinence. Denies uncontrolled  joint pain, swelling and limitation in mobility. Denies headaches, seizures, numbness, or tingling. Denies depression, anxiety or insomnia. Denies skin break down or rash.   PE  BP (!) 172/82 (BP Location: Left Arm, Patient Position: Sitting, Cuff Size: Normal)   Pulse 65   Ht 5\' 4"  (1.626 m)   Wt 235 lb 1.3 oz (106.6 kg)   SpO2 99%   BMI 40.35 kg/m   Patient alert and oriented and in no cardiopulmonary distress.  HEENT: No facial asymmetry, EOMI,     Neck supple .  Chest: Clear to auscultation bilaterally.  CVS: S1, S2 no murmurs, no S3.Regular rate.  ABD: Soft non tender.   Ext: No edema  MS: Adequate ROM spine, shoulders, hips and knees.  Skin: Intact, no ulcerations or rash noted.  Psych: Good eye contact, normal affect. Memory intact not anxious or depressed appearing.  CNS: CN 2-12 intact, power,  normal throughout.no focal deficits noted.   Assessment & Plan  Resistant hypertension Uncontrolled , has not   filled coreg which was prescribed at last visit DASH diet and commitment to daily physical activity for a minimum of 30 minutes discussed and encouraged, as a part of hypertension management. The importance of attaining a healthy weight is also discussed.     03/26/2023    9:21 AM 03/26/2023    9:11 AM 01/29/2023    9:49 AM 01/29/2023    9:10 AM 01/29/2023    9:09 AM 08/07/2022   10:00 AM 08/07/2022    9:31 AM  BP/Weight  Systolic BP 172 183 140 181 191 130 138  Diastolic BP 82 81 100 88 69 84 86  Wt. (Lbs)  235.08   238.04  242  BMI  40.35 kg/m2   40.86 kg/m2  41.54 kg/m2       MORBID OBESITY Morbid obesity linked with cancer, arthritis  Patient re-educated about  the importance of commitment to a  minimum of 150 minutes of exercise per week as able.  The importance of healthy food choices with portion control discussed, as well as eating regularly and within a 12 hour window most days. The need to choose "clean , green" food 50 to 75% of the time is discussed, as well as to make water the primary drink and set a goal of 64 ounces water daily.       03/26/2023    9:11 AM 01/29/2023    9:09 AM 08/07/2022    9:31 AM  Weight /BMI  Weight 235 lb 1.3 oz 238 lb 0.6 oz 242  lb  Height 5\' 4"  (1.626 m) 5\' 4"  (1.626 m) 5\' 4"  (1.626 m)  BMI 40.35 kg/m2 40.86 kg/m2 41.54 kg/m2    '2  Dyslipidemia Hyperlipidemia:Low fat diet discussed and encouraged.   Lipid Panel  Lab Results  Component Value Date   CHOL 127 01/25/2023   HDL 53 01/25/2023   LDLCALC 60 01/25/2023   TRIG 67 01/25/2023   CHOLHDL 2.4 01/25/2023

## 2023-04-02 ENCOUNTER — Telehealth: Payer: Self-pay

## 2023-04-02 NOTE — Telephone Encounter (Signed)
The patient is scheduled to see Debbe Odea, MD on 04-09-23 as a new patient. Attempted to call the patient to inquire about cardiac hx, but was unable to leave a voice message to request a call back due to voice mailbox been full.

## 2023-04-09 ENCOUNTER — Ambulatory Visit: Payer: Medicare Other | Attending: Cardiology | Admitting: Cardiology

## 2023-04-09 ENCOUNTER — Encounter: Payer: Self-pay | Admitting: Cardiology

## 2023-04-09 VITALS — BP 178/104 | HR 77 | Ht 64.0 in | Wt 235.8 lb

## 2023-04-09 DIAGNOSIS — I1A0 Resistant hypertension: Secondary | ICD-10-CM

## 2023-04-09 DIAGNOSIS — E7800 Pure hypercholesterolemia, unspecified: Secondary | ICD-10-CM

## 2023-04-09 DIAGNOSIS — R3589 Other polyuria: Secondary | ICD-10-CM | POA: Diagnosis not present

## 2023-04-09 MED ORDER — LOSARTAN POTASSIUM 100 MG PO TABS: 100.0000 mg | ORAL_TABLET | Freq: Every day | ORAL | 3 refills | Status: DC

## 2023-04-09 MED ORDER — CARVEDILOL 6.25 MG PO TABS: 6.2500 mg | ORAL_TABLET | Freq: Two times a day (BID) | ORAL | 0 refills | Status: DC

## 2023-04-09 NOTE — Progress Notes (Signed)
Cardiology Office Note:    Date:  04/09/2023   ID:  Heather [name], DOB 11/03/1940, MRN [id number]  PCP:  Kerri Perches, MD   [facility] Providers Cardiologist:  Debbe Odea, MD     Referring MD: Kerri Perches, MD   Chief Complaint  Patient presents with   New Patient (Initial Visit)    Referred for cardiac evaluation of Resistant hypertension with history of heart failure.  Blood pressure elevated on office check today but has not taken medications today.    History of Present Illness:    Heather [name] is a 82 y.o. female with a hx of hypertension, left breast cancer s/p mastectomy 1997 who presents with elevated blood pressures.  She has history of hypertension ongoing for several years.  Blood pressures have been elevated at home with systolics in the 170s.  Occasionally eat salty foods but watches her diet.  Denies chest pain or shortness of breath.  Currently takes HCTZ 12.5 mg daily, losartan, amlodipine.  Endorses frequent urination with taking HCTZ.  States not getting much sleep due to frequent urination.  She otherwise feels well.  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   Qualifier: Diagnosis of  By: [place] LPN, Kim  Left mastectomy  In 2000     Personal history of malignant neoplasm of breast     Past Surgical History:  Procedure Laterality Date   ABDOMINAL HYSTERECTOMY  1973   fibroids   BREAST SURGERY Left 1998   breast ca   FLEXIBLE SIGMOIDOSCOPY N/A 05/25/2016   Procedure: FLEXIBLE SIGMOIDOSCOPY;  Surgeon: West Bali, MD;  Location: [facility];  Service: Endoscopy;  Laterality: N/A;  2:00 AM   MASTECTOMY  1997   left breast    TOTAL ABDOMINAL HYSTERECTOMY W/ BILATERAL SALPINGOOPHORECTOMY  1971   unilateral    VESICOVAGINAL FISTULA CLOSURE W/ TAH  1971    Current Medications: Current Meds  Medication Sig   amLODipine (NORVASC) 10 MG tablet Take 1 tablet (10 mg total) by mouth daily.   cholecalciferol (VITAMIN D) 400 UNITS TABS tablet Take 400 Units by mouth daily.   losartan (COZAAR) 100 MG tablet Take 1 tablet (100 mg total) by mouth daily.   PROAIR HFA 108 (90 Base) MCG/ACT inhaler INHALE TWO PUFFS INTO THE LUNGS EVERY SIX HOURS AS NEEDED FOR WHEEZING OR SHORTNESS OF BREATH   rosuvastatin (CRESTOR) 5 MG tablet Take 1 tablet (5 mg total) by mouth daily.   [DISCONTINUED] carvedilol (COREG) 3.125 MG tablet Take 1 tablet (3.125 mg total) by mouth 2 (two) times daily with a meal.   [DISCONTINUED] losartan-hydrochlorothiazide (HYZAAR) 100-12.5 MG tablet Take 1 tablet by mouth daily.   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  Tobacco Use   Smoking status: Former    Current packs/day: 0.00    Types: Cigarettes    Start date: 07/19/1958    Quit date: 07/19/1998    Years since quitting: 24.7   Smokeless tobacco: Never  Vaping Use   Vaping status: Never Used  Substance and Sexual Activity   Alcohol use: No   Drug use: No   Sexual activity: Not Currently  Other Topics Concern   Not on file  Social History Narrative   Not on file   Social Determinants of Health   Financial Resource Strain: Low Risk  (03/09/2022)   Overall Financial Resource Strain  (CARDIA)    Difficulty of Paying Living Expenses: Not hard at all  Food Insecurity: No Food Insecurity (03/09/2022)   Hunger Vital Sign    Worried About Running Out of Food in the Last Year: Never true    Ran Out of Food in the Last Year: Never true  Transportation Needs: No Transportation Needs (03/04/2021)   PRAPARE - Administrator, Civil Service (Medical): No    Lack of Transportation (Non-Medical): No  Physical Activity: Insufficiently Active (03/09/2022)   Exercise Vital Sign    Days of Exercise per Week: 3 days    Minutes of Exercise per Session: 20 min  Stress: No Stress Concern Present (03/04/2021)   [employer] of Occupational Health - Occupational Stress Questionnaire    Feeling of Stress : Not at all  Social Connections: Moderately Isolated (03/09/2022)   Social Connection and Isolation Panel [NHANES]    Frequency of Communication with Friends and Family: More than three times a week    Frequency of Social Gatherings with Friends and Family: More than three times a week    Attends Religious Services: More than 4 times per year    Active Member of [employer] or Organizations: No    Attends Banker Meetings: Never    Marital Status: Widowed     Family History: The patient's family history includes Breast cancer in her sister and sister; Cancer in her brother; Cervical cancer in her sister; Coronary artery disease in an other family member; Heart disease in her father; Kidney disease in her sister; Lung cancer in her sister; Parkinsonism in her mother; Prostate cancer in her father.  ROS:   Please see the history of present illness.     All other systems reviewed and are negative.  EKGs/Labs/Other Studies Reviewed:    The following studies were reviewed today:  EKG Interpretation Date/Time:  Friday April 09 2023 09:33:30 EDT Ventricular Rate:  77 PR Interval:  186 QRS Duration:  76 QT Interval:  372 QTC Calculation: 420 R  Axis:   -16  Text Interpretation: Normal sinus rhythm Normal ECG Confirmed by Debbe Odea ([id number]) on 04/09/2023 9:42:56 AM    Recent Labs: 01/25/2023: ALT 6; BUN 12; Creatinine, Ser 0.86; Hemoglobin 12.4; Platelets 326; Potassium 4.6; Sodium 141; TSH 4.470  Recent Lipid Panel    Component Value Date/Time   CHOL 127 01/25/2023 1000   TRIG 67 01/25/2023 1000   HDL 53 01/25/2023 1000   CHOLHDL 2.4 01/25/2023 1000   CHOLHDL 4.0 09/26/2019 0947   VLDL 17 01/28/2016 0908   LDLCALC 60 01/25/2023 1000   LDLCALC 120 (H) 09/26/2019 0947     Risk Assessment/Calculations:  HYPERTENSION CONTROL Vitals:   04/09/23 0927 04/09/23 0936  BP: (!) 176/104 (!) 178/104    The patient's blood pressure is elevated above target today.  In order to address the patient's elevated BP: A new medication was prescribed today.            Physical Exam:    VS:  BP (!) 178/104 (BP Location: Left Arm, Patient Position: Sitting, Cuff Size: Large)   Pulse 77   Ht 5\' 4"  (1.626 m)   Wt 235 lb 12.8 oz (107 kg)   SpO2 97%   BMI 40.47 kg/m     Wt Readings from Last 3 Encounters:  04/09/23 235 lb 12.8 oz (107 kg)  03/26/23 235 lb 1.3 oz (106.6 kg)  01/29/23 238 lb 0.6 oz (108 kg)     GEN:  Well nourished, well developed in no acute distress HEENT: Normal NECK: No JVD; No carotid bruits CARDIAC: RRR, no murmurs, rubs, gallops RESPIRATORY:  Clear to auscultation without rales, wheezing or rhonchi  ABDOMEN: Soft, non-tender, non-distended MUSCULOSKELETAL: Lymphedema noted on arms. SKIN: Warm and dry NEUROLOGIC:  Alert and oriented x 3 PSYCHIATRIC:  Normal affect   ASSESSMENT:    1. Resistant hypertension   2. Pure hypercholesterolemia   3. Polyuria    PLAN:    In order of problems listed above:  Hypertension, BP elevated.  Complains of polyuria with HCTZ.  Stop HCTZ.  Prescribe losartan 100 mg daily, Coreg 6.25 mg twice daily.  Continue Norvasc 10 mg daily.  Obtain renal arterial  ultrasound.  Low-salt diet advised. Hyperlipidemia, cholesterol controlled.  Continue Crestor 5 mg daily. Polyuria, interfering with sleep.  Likely secondary to HCTZ.  Stop HCTZ, stop potassium as above, check BMP in about 10 days.  Follow-up in 6 weeks     Medication Adjustments/Labs and Tests Ordered: Current medicines are reviewed at length with the patient today.  Concerns regarding medicines are outlined above.  Orders Placed This Encounter  Procedures   Basic Metabolic Panel (BMET)   EKG 12-Lead   VAS US RENAL ARTERY DUPLEX   Meds ordered this encounter  Medications   carvedilol (COREG) 6.25 MG tablet    Sig: Take 1 tablet (6.25 mg total) by mouth 2 (two) times daily with a meal.    Dispense:  180 tablet    Refill:  0   losartan (COZAAR) 100 MG tablet    Sig: Take 1 tablet (100 mg total) by mouth daily.    Dispense:  90 tablet    Refill:  3    Patient Instructions  Medication Instructions:   STOP Potassium STOP Losartan - hydrochlorothiazide  START Losartan - Take one tablet (100mg ) by mouth daily.  INCREASE Carvedilol - Take one tablet (6.25mg )  by mouth twice a day  *If you need a refill on your cardiac medications before your next appointment, please call your pharmacy*   Lab Work:  Your provider would like for you to return in the day of your Renal Ultrasound to have the following labs drawn: BMP.   Please go to [facility] [address] ([facility] [address], [place] [address] You do not need an appointment.  They are open from 7:30 am-4 pm.  Lunch from 1:00 pm- 2:00 pm You WILL NOT need to be fasting.  If you have labs (blood work) drawn today and your tests are completely normal, you will receive your results only by: MyChart Message (if you have MyChart) OR A paper copy in  the mail If you have any lab test that is abnormal or we need to change your treatment, we will call you to review the  results.   Testing/Procedures:  Your physician has requested that you have a renal artery duplex within 7-14 days. During this test, an ultrasound is used to evaluate blood flow to the kidneys. Allow one hour for this exam. Do not eat after midnight the day before and avoid carbonated beverages. Take your medications as you usually do.    Follow-Up: At [facility], you and your health needs are our priority.  As part of our continuing mission to provide you with exceptional heart care, we have created designated Provider Care Teams.  These Care Teams include your primary Cardiologist (physician) and Advanced Practice Providers (APPs -  Physician Assistants and Nurse Practitioners) who all work together to provide you with the care you need, when you need it.  We recommend signing up for the patient portal called "MyChart".  Sign up information is provided on this After Visit Summary.  MyChart is used to connect with patients for Virtual Visits (Telemedicine).  Patients are able to view lab/test results, encounter notes, upcoming appointments, etc.  Non-urgent messages can be sent to your provider as well.   To learn more about what you can do with MyChart, go to [email or web address].    Your next appointment:   6 week(s)  Provider:   Debbe Odea, MD ONLY      Signed, Debbe Odea, MD  04/09/2023 10:12 AM    [facility]

## 2023-04-09 NOTE — Patient Instructions (Signed)
Medication Instructions:   STOP Potassium STOP Losartan - hydrochlorothiazide  START Losartan - Take one tablet (100mg ) by mouth daily.  INCREASE Carvedilol - Take one tablet (6.25mg )  by mouth twice a day  *If you need a refill on your cardiac medications before your next appointment, please call your pharmacy*   Lab Work:  Your provider would like for you to return in the day of your Renal Ultrasound to have the following labs drawn: BMP.   Please go to [facility] [address] ([facility]) [address], [place] [address] You do not need an appointment.  They are open from 7:30 am-4 pm.  Lunch from 1:00 pm- 2:00 pm You WILL NOT need to be fasting.  If you have labs (blood work) drawn today and your tests are completely normal, you will receive your results only by: MyChart Message (if you have MyChart) OR A paper copy in the mail If you have any lab test that is abnormal or we need to change your treatment, we will call you to review the results.   Testing/Procedures:  Your physician has requested that you have a renal artery duplex within 7-14 days. During this test, an ultrasound is used to evaluate blood flow to the kidneys. Allow one hour for this exam. Do not eat after midnight the day before and avoid carbonated beverages. Take your medications as you usually do.    Follow-Up: At [facility], you and your health needs are our priority.  As part of our continuing mission to provide you with exceptional heart care, we have created designated Provider Care Teams.  These Care Teams include your primary Cardiologist (physician) and Advanced Practice Providers (APPs -  Physician Assistants and Nurse Practitioners) who all work together to provide you with the care you need, when you need it.  We recommend signing up for the patient portal called "MyChart".  Sign up information is provided on this After Visit Summary.  MyChart is used to connect with  patients for Virtual Visits (Telemedicine).  Patients are able to view lab/test results, encounter notes, upcoming appointments, etc.  Non-urgent messages can be sent to your provider as well.   To learn more about what you can do with MyChart, go to [email or web address].    Your next appointment:   6 week(s)  Provider:   Debbe Odea, MD ONLY

## 2023-04-20 ENCOUNTER — Ambulatory Visit (INDEPENDENT_AMBULATORY_CARE_PROVIDER_SITE_OTHER): Payer: Medicare Other

## 2023-04-20 VITALS — Ht 64.0 in | Wt 235.0 lb

## 2023-04-20 DIAGNOSIS — Z0000 Encounter for general adult medical examination without abnormal findings: Secondary | ICD-10-CM | POA: Diagnosis not present

## 2023-04-20 NOTE — Patient Instructions (Signed)
Heather [name] , Thank you for taking time to come for your Medicare Wellness Visit. I appreciate your ongoing commitment to your health goals. Please review the following plan we discussed and let me know if I can assist you in the future.   Referrals/Orders/Follow-Ups/Clinician Recommendations:  Next Medicare Annual Wellness Visit: June 07, 2024 at 1:10pm virtual visit   This is a list of the screening recommended for you and due dates:  Health Maintenance  Topic Date Due   Zoster (Shingles) Vaccine (1 of 2) Never done   DTaP/Tdap/Td vaccine (2 - Tdap) 02/13/2017   COVID-19 Vaccine (3 - Moderna risk series) 11/22/2019   DEXA scan (bone density measurement)  12/22/2023   Medicare Annual Wellness Visit  04/19/2024   Pneumonia Vaccine  Completed   Flu Shot  Completed   HPV Vaccine  Aged Out   Hepatitis C Screening  Discontinued    Advanced directives: (Provided) Advance directive discussed with you today. I have provided a copy for you to complete at home and have notarized. Once this is complete, please bring a copy in to our office so we can scan it into your chart.   Next Medicare Annual Wellness Visit scheduled for next year: Yes  Preventive Care 82 Years and Older, Female Preventive care refers to lifestyle choices and visits with your health care provider that can promote health and wellness. Preventive care visits are also called wellness exams. What can I expect for my preventive care visit? Counseling Your health care provider may ask you questions about your: Medical history, including: Past medical problems. Family medical history. Pregnancy and menstrual history. History of falls. Current health, including: Memory and ability to understand (cognition). Emotional well-being. Home life and relationship well-being. Sexual activity and sexual health. Lifestyle, including: Alcohol, nicotine or tobacco, and drug use. Access to firearms. Diet, exercise, and sleep  habits. Work and work Astronomer. Sunscreen use. Safety issues such as seatbelt and bike helmet use. Physical exam Your health care provider will check your: Height and weight. These may be used to calculate your BMI (body mass index). BMI is a measurement that tells if you are at a healthy weight. Waist circumference. This measures the distance around your waistline. This measurement also tells if you are at a healthy weight and may help predict your risk of certain diseases, such as type 2 diabetes and high blood pressure. Heart rate and blood pressure. Body temperature. Skin for abnormal spots. What immunizations do I need?  Vaccines are usually given at various ages, according to a schedule. Your health care provider will recommend vaccines for you based on your age, medical history, and lifestyle or other factors, such as travel or where you work. What tests do I need? Screening Your health care provider may recommend screening tests for certain conditions. This may include: Lipid and cholesterol levels. Hepatitis C test. Hepatitis B test. HIV (human immunodeficiency virus) test. STI (sexually transmitted infection) testing, if you are at risk. Lung cancer screening. Colorectal cancer screening. Diabetes screening. This is done by checking your blood sugar (glucose) after you have not eaten for a while (fasting). Mammogram. Talk with your health care provider about how often you should have regular mammograms. BRCA-related cancer screening. This may be done if you have a family history of breast, ovarian, tubal, or peritoneal cancers. Bone density scan. This is done to screen for osteoporosis. Talk with your health care provider about your test results, treatment options, and if necessary, the need for more  tests. Follow these instructions at home: Eating and drinking  Eat a diet that includes fresh fruits and vegetables, whole grains, lean protein, and low-fat dairy products.  Limit your intake of foods with high amounts of sugar, saturated fats, and salt. Take vitamin and mineral supplements as recommended by your health care provider. Do not drink alcohol if your health care provider tells you not to drink. If you drink alcohol: Limit how much you have to 0-1 drink a day. Know how much alcohol is in your drink. In the [place] one drink equals one 12 oz bottle of beer (355 mL), one 5 oz glass of wine (148 mL), or one 1 oz glass of hard liquor (44 mL). Lifestyle Brush your teeth every morning and night with fluoride toothpaste. Floss one time each day. Exercise for at least 30 minutes 5 or more days each week. Do not use any products that contain nicotine or tobacco. These products include cigarettes, chewing tobacco, and vaping devices, such as e-cigarettes. If you need help quitting, ask your health care provider. Do not use drugs. If you are sexually active, practice safe sex. Use a condom or other form of protection in order to prevent STIs. Take aspirin only as told by your health care provider. Make sure that you understand how much to take and what form to take. Work with your health care provider to find out whether it is safe and beneficial for you to take aspirin daily. Ask your health care provider if you need to take a cholesterol-lowering medicine (statin). Find healthy ways to manage stress, such as: Meditation, yoga, or listening to music. Journaling. Talking to a trusted person. Spending time with friends and family. Minimize exposure to UV radiation to reduce your risk of skin cancer. Safety Always wear your seat belt while driving or riding in a vehicle. Do not drive: If you have been drinking alcohol. Do not ride with someone who has been drinking. When you are tired or distracted. While texting. If you have been using any mind-altering substances or drugs. Wear a helmet and other protective equipment during sports activities. If you have  firearms in your house, make sure you follow all gun safety procedures. What's next? Visit your health care provider once a year for an annual wellness visit. Ask your health care provider how often you should have your eyes and teeth checked. Stay up to date on all vaccines. This information is not intended to replace advice given to you by your health care provider. Make sure you discuss any questions you have with your health care provider. Document Revised: 01/01/2021 Document Reviewed: 01/01/2021 Elsevier Patient Education  2024 [employer]. Understanding Your Risk for Falls Millions of people have serious injuries from falls each year. It is important to understand your risk of falling. Talk with your health care provider about your risk and what you can do to lower it. If you do have a serious fall, make sure to tell your provider. Falling once raises your risk of falling again. How can falls affect me? Serious injuries from falls are common. These include: Broken bones, such as hip fractures. Head injuries, such as traumatic brain injuries (TBI) or concussions. A fear of falling can cause you to avoid activities and stay at home. This can make your muscles weaker and raise your risk for a fall. What can increase my risk? There are a number of risk factors that increase your risk for falling. The more risk factors you have,  the higher your risk of falling. Serious injuries from a fall happen most often to people who are older than 82 years old. Teenagers and young adults ages 76-29 are also at higher risk. Common risk factors include: Weakness in the lower body. Being generally weak or confused due to long-term (chronic) illness. Dizziness or balance problems. Poor vision. Medicines that cause dizziness or drowsiness. These may include: Medicines for your blood pressure, heart, anxiety, insomnia, or swelling (edema). Pain medicines. Muscle relaxants. Other risk factors  include: Drinking alcohol. Having had a fall in the past. Having foot pain or wearing improper footwear. Working at a dangerous job. Having any of the following in your home: Tripping hazards, such as floor clutter or loose rugs. Poor lighting. Pets. Having dementia or memory loss. What actions can I take to lower my risk of falling?     Physical activity Stay physically fit. Do strength and balance exercises. Consider taking a regular class to build strength and balance. Yoga and tai chi are good options. Vision Have your eyes checked every year and your prescription for glasses or contacts updated as needed. Shoes and walking aids Wear non-skid shoes. Wear shoes that have rubber soles and low heels. Do not wear high heels. Do not walk around the house in socks or slippers. Use a cane or walker as told by your provider. Home safety Attach secure railings on both sides of your stairs. Install grab bars for your bathtub, shower, and toilet. Use a non-skid mat in your bathtub or shower. Attach bath mats securely with double-sided, non-slip rug tape. Use good lighting in all rooms. Keep a flashlight near your bed. Make sure there is a clear path from your bed to the bathroom. Use night-lights. Do not use throw rugs. Make sure all carpeting is taped or tacked down securely. Remove all clutter from walkways and stairways, including extension cords. Repair uneven or broken steps and floors. Avoid walking on icy or slippery surfaces. Walk on the grass instead of on icy or slick sidewalks. Use ice melter to get rid of ice on walkways in the winter. Use a cordless phone. Questions to ask your health care provider Can you help me check my risk for a fall? Do any of my medicines make me more likely to fall? Should I take a vitamin D supplement? What exercises can I do to improve my strength and balance? Should I make an appointment to have my vision checked? Do I need a bone density test  to check for weak bones (osteoporosis)? Would it help to use a cane or a walker? Where to find more information Centers for Disease Control and Prevention, [employer]: TonerPromos.no Community-Based Fall Prevention Programs: TonerPromos.no [employer] on Aging: BaseRingTones.pl Contact a health care provider if: You fall at home. You are afraid of falling at home. You feel weak, drowsy, or dizzy. This information is not intended to replace advice given to you by your health care provider. Make sure you discuss any questions you have with your health care provider. Document Revised: 03/09/2022 Document Reviewed: 03/09/2022 Elsevier Patient Education  2024 [employer].

## 2023-04-20 NOTE — Progress Notes (Signed)
 Because this visit was a virtual/telehealth visit,  certain criteria was not obtained, such a blood pressure, CBG if applicable, and timed get up and go. Any medications not marked as "taking" were not mentioned during the medication reconciliation part of the visit. Any vitals not documented were not able to be obtained due to this being a telehealth visit or patient was unable to self-report a recent blood pressure reading due to a lack of equipment at home via telehealth. Vitals that have been documented are verbally provided by the patient.   Subjective:   Heather [name] is a 82 y.o. female who presents for Medicare Annual (Subsequent) preventive examination.  Visit Complete: Virtual  I connected with  Heather [name] on 04/20/23 by a audio enabled telemedicine application and verified that I am speaking with the correct person using two identifiers.  Patient Location: Home  Provider Location: Home Office  I discussed the limitations of evaluation and management by telemedicine. The patient expressed understanding and agreed to proceed.  Patient Medicare AWV questionnaire was completed by the patient on n/a; I have confirmed that all information answered by patient is correct and no changes since this date.  Cardiac Risk Factors include: advanced age (>21men, >42 women);dyslipidemia;hypertension;obesity (BMI >30kg/m2);sedentary lifestyle     Objective:    Today's Vitals   04/20/23 1029 04/20/23 1030  Weight: 235 lb (106.6 kg)   Height: 5\' 4"  (1.626 m)   PainSc:  0-No pain   Body mass index is 40.34 kg/m.     04/20/2023   10:27 AM 12/11/2021    1:44 PM 03/04/2021    3:15 PM 12/05/2018    2:01 PM 02/17/2018   10:26 AM 11/17/2017    1:36 PM 11/13/2016   10:20 AM  Advanced Directives  Does Patient Have a Medical Advance Directive? No No Yes Yes Yes Yes Yes  Type of Advance Directive   Living will Living will;Healthcare Power of [employer] Power of [place];Living will  Living will  Does patient want to make changes to medical advance directive?    No - Patient declined Yes (ED - Information included in AVS) No - Patient declined   Copy of Healthcare Power of Attorney in Chart?    No - copy requested  No - copy requested   Would patient like information on creating a medical advance directive? Yes (MAU/Ambulatory/Procedural Areas - Information given) No - Patient declined         Current Medications (verified) Outpatient Encounter Medications as of 04/20/2023  Medication Sig   amLODipine (NORVASC) 10 MG tablet Take 1 tablet (10 mg total) by mouth daily.   carvedilol (COREG) 6.25 MG tablet Take 1 tablet (6.25 mg total) by mouth 2 (two) times daily with a meal.   cholecalciferol (VITAMIN D) 400 UNITS TABS tablet Take 400 Units by mouth daily.   losartan (COZAAR) 100 MG tablet Take 1 tablet (100 mg total) by mouth daily.   PROAIR HFA 108 (90 Base) MCG/ACT inhaler INHALE TWO PUFFS INTO THE LUNGS EVERY SIX HOURS AS NEEDED FOR WHEEZING OR SHORTNESS OF BREATH   rosuvastatin (CRESTOR) 5 MG tablet Take 1 tablet (5 mg total) by mouth daily.   No facility-administered encounter medications on file as of 04/20/2023.    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   Qualifier: Diagnosis of  By: [place] LPN, Kim  Left mastectomy  In 2000     Personal history of malignant neoplasm of breast    Past Surgical History:  Procedure Laterality Date   ABDOMINAL HYSTERECTOMY  1973   fibroids   BREAST SURGERY Left 1998   breast ca   FLEXIBLE  SIGMOIDOSCOPY N/A 05/25/2016   Procedure: FLEXIBLE SIGMOIDOSCOPY;  Surgeon: West Bali, MD;  [facility];  Service: Endoscopy;  Laterality: N/A;  2:00 AM   MASTECTOMY  1997   left breast    TOTAL ABDOMINAL HYSTERECTOMY W/ BILATERAL SALPINGOOPHORECTOMY  1971   unilateral    VESICOVAGINAL FISTULA CLOSURE W/ TAH  1971   Family History  Problem Relation Age of Onset   Parkinsonism Mother    Heart disease Father    Prostate cancer Father    Kidney disease Sister        nephrectomy   Breast cancer Sister    Lung cancer Sister    Breast cancer Sister    Cervical cancer Sister    Cancer Brother        throat   Coronary artery disease Other        Family History of males    Social History   Socioeconomic History   Marital status: Widowed    Spouse name: Not on file   Number of children: 6   Years of education: Not on file   Highest education level: Some college, no degree  Occupational History   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  Tobacco Use   Smoking status: Former    Current packs/day: 0.00    Types: Cigarettes    Start date: 07/19/1958    Quit date: 07/19/1998    Years since quitting: 24.7   Smokeless tobacco: Never  Vaping Use   Vaping status: Never Used  Substance and Sexual Activity   Alcohol use: No   Drug use: No   Sexual activity: Not Currently  Other Topics Concern   Not on file  Social History Narrative   Not on file   Social Determinants of Health   Financial Resource Strain: Low Risk  (04/20/2023)   Overall Financial Resource Strain (CARDIA)    Difficulty of Paying Living Expenses: Not hard at all  Food Insecurity: No Food Insecurity (04/20/2023)   Hunger Vital Sign    Worried About Running Out of Food in the Last Year: Never true    Ran Out of Food in the Last Year: Never true  Transportation Needs: No Transportation Needs (04/20/2023)   PRAPARE - Administrator, Civil Service (Medical): No    Lack of Transportation  (Non-Medical): No  Physical Activity: Insufficiently Active (04/20/2023)   Exercise Vital Sign    Days of Exercise per Week: 3 days    Minutes of Exercise per Session: 20 min  Stress: No Stress Concern Present (04/20/2023)   [employer] of Occupational Health - Occupational Stress Questionnaire    Feeling of Stress : Not at all  Social Connections: Moderately Integrated (04/20/2023)   Social Connection and Isolation Panel [NHANES]    Frequency of Communication with Friends and Family: More than three  times a week    Frequency of Social Gatherings with Friends and Family: More than three times a week    Attends Religious Services: More than 4 times per year    Active Member of [employer] or Organizations: No    Attends Engineer, structural: Never    Marital Status: Married    Tobacco Counseling Counseling given: Yes   Clinical Intake:  Pre-visit preparation completed: Yes  Pain : No/denies pain Pain Score: 0-No pain     BMI - recorded: 40.34 Nutritional Status: BMI > 30  Obese Nutritional Risks: None Diabetes: No  How often do you need to have someone help you when you read instructions, pamphlets, or other written materials from your doctor or pharmacy?: 1 - Never  Interpreter Needed?: No  Information entered by ::  Josel Keo, CMA   Activities of Daily Living    04/20/2023   10:39 AM  In your present state of health, do you have any difficulty performing the following activities:  Hearing? 0  Vision? 0  Difficulty concentrating or making decisions? 0  Walking or climbing stairs? 0  Dressing or bathing? 0  Doing errands, shopping? 0  Preparing Food and eating ? N  Using the Toilet? N  In the past six months, have you accidently leaked urine? N  Do you have problems with loss of bowel control? N  Managing your Medications? N  Managing your Finances? N  Housekeeping or managing your Housekeeping? N    Patient Care Team: Kerri Perches, MD as  PCP - General Debbe Odea, MD as PCP - Cardiology (Cardiology) Jacalyn Lefevre, Maurine Minister, PA-C (Inactive) as Physician Assistant (Oncology) Doreatha Massed, MD as Medical Oncologist (Medical Oncology)  Indicate any recent Medical Services you may have received from other than [facility] providers in the past year (date may be approximate).     Assessment:   This is a routine wellness examination for [facility].  Hearing/Vision screen Hearing Screening - Comments:: Patient denies any hearing difficulties.   Vision Screening - Comments:: Wears rx glasses - up to date with routine eye exams with [facility]   Goals Addressed             This Visit's Progress    Patient Stated       To be healthy       Depression Screen    04/20/2023   10:32 AM 03/26/2023    9:20 AM 01/29/2023    9:10 AM 08/07/2022    9:32 AM 01/16/2022    9:49 AM 03/04/2021    3:09 PM 01/07/2021   10:13 AM  PHQ 2/9 Scores  PHQ - 2 Score 0 0 0 0 0 0 0  PHQ- 9 Score 0 0  0       Fall Risk    04/20/2023   10:38 AM 03/26/2023    9:20 AM 01/29/2023    9:10 AM 08/07/2022    9:32 AM 03/09/2022   10:22 AM  Fall Risk   Falls in the past year? 0 0 0 0 0  Number falls in past yr: 0  0 0 0  Injury with Fall? 0  0 0 0  Risk for fall due to : No Fall Risks  No Fall Risks No Fall Risks   Follow up Falls prevention discussed  Falls evaluation completed Falls evaluation completed     MEDICARE RISK AT HOME: Medicare Risk at Home Any stairs in or around the home?: No If so,  are there any without handrails?: No Home free of loose throw rugs in walkways, pet beds, electrical cords, etc?: Yes Adequate lighting in your home to reduce risk of falls?: Yes Life alert?: No Use of a cane, walker or w/c?: No Grab bars in the bathroom?: No Shower chair or bench in shower?: No Elevated toilet seat or a handicapped toilet?: No  TIMED UP AND GO:  Was the test performed?  No    Cognitive Function:        04/20/2023    10:31 AM 03/04/2021    3:17 PM 12/25/2019    8:34 AM 02/17/2018   10:36 AM 02/17/2018   10:29 AM  6CIT Screen  What Year? 0 points 0 points  0 points 0 points  What month? 0 points 0 points  0 points 0 points  What time? 0 points 0 points 0 points 0 points 0 points  Count back from 20 0 points 0 points 0 points 0 points   Months in reverse 0 points 0 points 0 points 0 points   Repeat phrase 0 points 0 points 0 points 0 points   Total Score 0 points 0 points  0 points     Immunizations Immunization History  Administered Date(s) Administered   Fluad Quad(high Dose 65+) 05/23/2019   Fluad Trivalent(High Dose 65+) 03/26/2023   Influenza Whole 05/06/2006, 05/25/2007, 05/06/2009, 03/27/2010   Influenza, High Dose Seasonal PF 08/09/2018   Influenza,inj,Quad PF,6+ Mos 04/12/2013, 05/30/2014, 05/09/2015, 05/28/2016   Moderna Sars-Covid-2 Vaccination 09/27/2019, 10/25/2019   Pneumococcal Conjugate-13 11/11/2017   Pneumococcal Polysaccharide-23 02/14/2007, 01/30/2016   Td 02/14/2007    TDAP status: Due, Education has been provided regarding the importance of this vaccine. Advised may receive this vaccine at local pharmacy or Health Dept. Aware to provide a copy of the vaccination record if obtained from local pharmacy or Health Dept. Verbalized acceptance and understanding.  Flu Vaccine status: Up to date  Pneumococcal vaccine status: Up to date  Covid-19 vaccine status: Information provided on how to obtain vaccines.   Qualifies for Shingles Vaccine? Yes   Zostavax completed No   Shingrix Completed?: No.    Education has been provided regarding the importance of this vaccine. Patient has been advised to call insurance company to determine out of pocket expense if they have not yet received this vaccine. Advised may also receive vaccine at local pharmacy or Health Dept. Verbalized acceptance and understanding.  Screening Tests Health Maintenance  Topic Date Due   Zoster Vaccines- Shingrix  (1 of 2) Never done   DTaP/Tdap/Td (2 - Tdap) 02/13/2017   COVID-19 Vaccine (3 - Moderna risk series) 11/22/2019   Medicare Annual Wellness (AWV)  01/29/2024   Pneumonia Vaccine 96+ Years old  Completed   INFLUENZA VACCINE  Completed   DEXA SCAN  Completed   HPV VACCINES  Aged Out   Hepatitis C Screening  Discontinued    Health Maintenance  Health Maintenance Due  Topic Date Due   Zoster Vaccines- Shingrix (1 of 2) Never done   DTaP/Tdap/Td (2 - Tdap) 02/13/2017   COVID-19 Vaccine (3 - Moderna risk series) 11/22/2019    Colorectal cancer screening: No longer required.   Mammogram status: No longer required due to age.  Bone Density status: Completed 12/22/2023. Results reflect: Bone density results: NORMAL. Repeat every 5 years.  Lung Cancer Screening: (Low Dose CT Chest recommended if Age 64-80 years, 20 pack-year currently smoking OR have quit w/in 15years.) does not qualify.   Lung Cancer  Screening Referral: n/a  Additional Screening:  Hepatitis C Screening: does not qualify; Completed 01/07/2021   Vision Screening: Recommended annual ophthalmology exams for early detection of glaucoma and other disorders of the eye. Is the patient up to date with their annual eye exam?  Yes  Who is the provider or what is the name of the office in which the patient attends annual eye exams? [facility] If pt is not established with a provider, would they like to be referred to a provider to establish care? No .   Dental Screening: Recommended annual dental exams for proper oral hygiene  Diabetic Foot Exam: n/a  Community Resource Referral / Chronic Care Management: CRR required this visit?  No   CCM required this visit?  No     Plan:     I have personally reviewed and noted the following in the patient's chart:   Medical and social history Use of alcohol, tobacco or illicit drugs  Current medications and supplements including opioid prescriptions. Patient is not  currently taking opioid prescriptions. Functional ability and status Nutritional status Physical activity Advanced directives List of other physicians Hospitalizations, surgeries, and ER visits in previous 12 months Vitals Screenings to include cognitive, depression, and falls Referrals and appointments  In addition, I have reviewed and discussed with patient certain preventive protocols, quality metrics, and best practice recommendations. A written personalized care plan for preventive services as well as general preventive health recommendations were provided to patient.     Jordan Hawks Bama Hanselman, CMA   04/20/2023   After Visit Summary: (Mail) Due to this being a telephonic visit, the after visit summary with patients personalized plan was offered to patient via mail

## 2023-05-03 ENCOUNTER — Other Ambulatory Visit
Admission: RE | Admit: 2023-05-03 | Discharge: 2023-05-03 | Disposition: A | Discharge status: Home / Self Care | Payer: Medicare Other | Attending: Cardiology | Admitting: Cardiology

## 2023-05-03 DIAGNOSIS — I1A0 Resistant hypertension: Secondary | ICD-10-CM | POA: Diagnosis not present

## 2023-05-03 DIAGNOSIS — E785 Hyperlipidemia, unspecified: Secondary | ICD-10-CM | POA: Diagnosis not present

## 2023-05-03 LAB — COMPREHENSIVE METABOLIC PANEL
ALT: 8 U/L (ref 0–44)
AST: 15 U/L (ref 15–41)
Albumin: 3.9 g/dL (ref 3.5–5.0)
Alkaline Phosphatase: 81 U/L (ref 38–126)
Anion gap: 10 (ref 5–15)
BUN: 18 mg/dL (ref 8–23)
CO2: 28 mmol/L (ref 22–32)
Calcium: 9 mg/dL (ref 8.9–10.3)
Chloride: 102 mmol/L (ref 98–111)
Creatinine, Ser: 0.86 mg/dL (ref 0.44–1.00)
GFR, Estimated: >60 mL/min (ref >60)
Glucose, Bld: 104 mg/dL — ABNORMAL HIGH (ref 70–99)
Potassium: 4.1 mmol/L (ref 3.5–5.1)
Sodium: 140 mmol/L (ref 135–145)
Total Bilirubin: 0.7 mg/dL (ref 0.3–1.2)
Total Protein: 7.9 g/dL (ref 6.5–8.1)

## 2023-05-03 LAB — LIPID PANEL
Cholesterol: 109 mg/dL (ref 0–200)
HDL: 50 mg/dL (ref >40)
LDL Cholesterol: 48 mg/dL (ref 0–99)
Total CHOL/HDL Ratio: 2.2 {ratio}
Triglycerides: 55 mg/dL (ref <150)
VLDL: 11 mg/dL (ref 0–40)

## 2023-05-06 ENCOUNTER — Ambulatory Visit: Payer: Medicare Other | Attending: Cardiology

## 2023-05-06 DIAGNOSIS — I1A0 Resistant hypertension: Secondary | ICD-10-CM | POA: Diagnosis not present

## 2023-05-07 ENCOUNTER — Other Ambulatory Visit: Payer: Self-pay | Admitting: *Deleted

## 2023-05-07 DIAGNOSIS — I1A0 Resistant hypertension: Secondary | ICD-10-CM

## 2023-05-20 ENCOUNTER — Ambulatory Visit: Payer: Medicare Other | Attending: Cardiology | Admitting: Cardiology

## 2023-05-21 ENCOUNTER — Encounter: Payer: Self-pay | Admitting: Cardiology

## 2023-08-03 ENCOUNTER — Ambulatory Visit: Payer: Medicare Other | Attending: Cardiovascular Disease | Admitting: Cardiovascular Disease

## 2023-08-03 ENCOUNTER — Encounter: Payer: Self-pay | Admitting: Cardiovascular Disease

## 2023-08-03 VITALS — BP 140/72 | HR 92 | Ht 64.0 in | Wt 237.0 lb

## 2023-08-03 DIAGNOSIS — I1A0 Resistant hypertension: Secondary | ICD-10-CM | POA: Diagnosis not present

## 2023-08-03 DIAGNOSIS — E785 Hyperlipidemia, unspecified: Secondary | ICD-10-CM

## 2023-08-03 DIAGNOSIS — K551 Chronic vascular disorders of intestine: Secondary | ICD-10-CM | POA: Diagnosis not present

## 2023-08-03 DIAGNOSIS — I701 Atherosclerosis of renal artery: Secondary | ICD-10-CM

## 2023-08-03 NOTE — Progress Notes (Signed)
 Cardiology Office Note   Date:  08/03/2023   ID:  Heather, [name] 09-23-1940, MRN [id number]  PCP:  Antonetta Rollene BRAVO, MD  Cardiologist:  Dr. Budd Kindle  Chief Complaint  Patient presents with   Follow-up    Renal Stenosis no complaints today. Meds reviewed verbally with pt.      History of Present Illness: Heather [name] is a 83 y.o. female who was referred by Dr.Agbor Kindle for evaluation management of renal artery stenosis. She has known history of resistant hypertension, left breast cancer status postmastectomy and GERD. She has prolonged history of difficult to control blood pressure.  Most recently, hydrochlorothiazide  was discontinued due to frequent urination.  Blood pressure seems to be now reasonably controlled on 3 medications.  She reports that her blood pressure is always higher at the physician's office.  She underwent renal artery duplex which showed moderate left renal artery stenosis with peak velocity of 205.  There was evidence of significant stenosis in the SMA.  However, she denies any abdominal pain or weight loss.   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   Qualifier: Diagnosis of  By: [place] LPN, Kim  Left mastectomy  In 2000     Personal history of malignant neoplasm of breast     Past Surgical History:  Procedure Laterality Date   ABDOMINAL HYSTERECTOMY  1973   fibroids   BREAST SURGERY Left 1998   breast ca   FLEXIBLE SIGMOIDOSCOPY N/A 05/25/2016   Procedure: FLEXIBLE SIGMOIDOSCOPY;  Surgeon: Margo LITTIE Haddock, MD;  [facility];  Service:  Endoscopy;  Laterality: N/A;  2:00 AM   MASTECTOMY  1997   left breast    TOTAL ABDOMINAL HYSTERECTOMY W/ BILATERAL SALPINGOOPHORECTOMY  1971   unilateral    VESICOVAGINAL FISTULA CLOSURE W/ TAH  1971     Current Outpatient Medications  Medication Sig Dispense Refill   amLODipine  (NORVASC ) 10 MG tablet Take 1 tablet (10 mg total) by mouth daily. 90 tablet 3   carvedilol  (COREG ) 6.25 MG tablet Take 1 tablet (6.25 mg total) by mouth 2 (two) times daily with a meal. 180 tablet 0   cholecalciferol (VITAMIN D ) 400 UNITS TABS tablet Take 400 Units by mouth daily.     losartan  (COZAAR ) 100 MG tablet Take 1 tablet (100 mg total) by mouth daily. 90 tablet 3   PROAIR  HFA 108 (90 Base) MCG/ACT inhaler INHALE TWO PUFFS INTO THE LUNGS EVERY SIX HOURS AS NEEDED FOR WHEEZING OR SHORTNESS OF BREATH 8.5 g 1   rosuvastatin  (CRESTOR ) 5 MG tablet Take 1 tablet (5 mg total) by mouth daily. 90 tablet 3   No current facility-administered medications for this visit.    Allergies:   Aspirin and Penicillins    Social History:  The patient  reports that she quit smoking about 25 years ago. Her smoking use included cigarettes. She started smoking about 65 years ago. She has never used smokeless tobacco. She reports that she does not drink alcohol and does not use  drugs.   Family History:  The patient's family history includes Breast cancer in her sister and sister; Cancer in her brother; Cervical cancer in her sister; Coronary artery disease in an other family member; Heart disease in her father; Kidney disease in her sister; Lung cancer in her sister; Parkinsonism in her mother; Prostate cancer in her father.    ROS:  Please see the history of present illness.   Otherwise, review of systems are positive for none.   All other systems are reviewed and negative.    PHYSICAL EXAM: VS:  BP (!) 140/72 (BP Location: Left Arm, Patient Position: Sitting, Cuff Size: Large)   Pulse 92   Ht 5' 4 (1.626 m)   Wt 237 lb  (107.5 kg)   SpO2 97%   BMI 40.68 kg/m  , BMI Body mass index is 40.68 kg/m. GEN: Well nourished, well developed, in no acute distress  HEENT: normal  Neck: no JVD, carotid bruits, or masses Cardiac: RRR; no murmurs, rubs, or gallops,no edema  Respiratory:  clear to auscultation bilaterally, normal work of breathing GI: soft, nontender, nondistended, + BS MS: no deformity or atrophy  Skin: warm and dry, no rash Neuro:  Strength and sensation are intact Psych: euthymic mood, full affect   EKG:  EKG is not ordered today.    Recent Labs: 01/25/2023: Hemoglobin 12.4; Platelets 326; TSH 4.470 05/03/2023: ALT 8; BUN 18; Creatinine, Ser 0.86; Potassium 4.1; Sodium 140    Lipid Panel    Component Value Date/Time   CHOL 109 05/03/2023 0915   CHOL 127 01/25/2023 1000   TRIG 55 05/03/2023 0915   HDL 50 05/03/2023 0915   HDL 53 01/25/2023 1000   CHOLHDL 2.2 05/03/2023 0915   VLDL 11 05/03/2023 0915   LDLCALC 48 05/03/2023 0915   LDLCALC 60 01/25/2023 1000   LDLCALC 120 (H) 09/26/2019 0947      Wt Readings from Last 3 Encounters:  08/03/23 237 lb (107.5 kg)  04/20/23 235 lb (106.6 kg)  04/09/23 235 lb 12.8 oz (107 kg)           04/09/2023    9:27 AM  PAD Screen  Previous PAD dx? No  Previous surgical procedure? No  Pain with walking? No  Feet/toe relief with dangling? No  Painful, non-healing ulcers? No  Extremities discolored? No      ASSESSMENT AND PLAN:  1.  Resistant hypertension: There is evidence of moderate left renal artery stenosis that does not seem to be critical enough to require revascularization.  Continue monitoring for now. Her blood pressure seems to be reasonably controlled now on amlodipine , carvedilol  and losartan .  In addition, she has normal renal function.  As such, no evidence of ischemic nephropathy.  2.  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  She has no symptoms of chronic mesenteric ischemia.  No  further workup is needed at this time.  3.  Hyperlipidemia: On small dose rosuvastatin  5 mg once daily.    Disposition:   FU with me in 1 year  Signed,  Deatrice Cage, MD  08/03/2023 1:54 PM    [facility]

## 2023-08-03 NOTE — Patient Instructions (Signed)

## 2023-08-05 ENCOUNTER — Ambulatory Visit: Payer: Medicare Other | Admitting: Family Medicine

## 2023-08-05 ENCOUNTER — Encounter: Payer: Self-pay | Admitting: Family Medicine

## 2023-08-05 ENCOUNTER — Ambulatory Visit: Payer: Medicare Other | Attending: Cardiology | Admitting: Cardiology

## 2023-08-05 ENCOUNTER — Encounter: Payer: Self-pay | Admitting: Cardiology

## 2023-08-05 ENCOUNTER — Ambulatory Visit: Payer: Medicare Other | Admitting: Cardiology

## 2023-08-05 VITALS — BP 156/81 | HR 79 | Ht 64.0 in | Wt 237.0 lb

## 2023-08-05 VITALS — BP 128/80 | HR 81 | Ht 64.0 in | Wt 236.1 lb

## 2023-08-05 DIAGNOSIS — R7303 Prediabetes: Secondary | ICD-10-CM | POA: Diagnosis not present

## 2023-08-05 DIAGNOSIS — E785 Hyperlipidemia, unspecified: Secondary | ICD-10-CM

## 2023-08-05 DIAGNOSIS — I10 Essential (primary) hypertension: Secondary | ICD-10-CM

## 2023-08-05 DIAGNOSIS — I1A0 Resistant hypertension: Secondary | ICD-10-CM | POA: Diagnosis not present

## 2023-08-05 DIAGNOSIS — I701 Atherosclerosis of renal artery: Secondary | ICD-10-CM | POA: Diagnosis not present

## 2023-08-05 NOTE — Progress Notes (Signed)
Cardiology Office Note:    Date:  08/05/2023   ID:  Heather [name], DOB Dec 25, 1940, MRN [id number]  PCP:  Kerri Perches, MD   [facility] Providers Cardiologist:  Debbe Odea, MD     Referring MD: Kerri Perches, MD   Chief Complaint  Patient presents with   Follow-up    Patient does not check blood pressures at home.  Tolerating blood pressure medication changes made at last visit.    History of Present Illness:    Heather [name] is a 83 y.o. female with a hx of hypertension, left breast cancer s/p mastectomy 1997 who presents for follow-up.  She was previously seen for elevated BP, renal arterial ultrasound obtained showing moderate left renal artery stenosis.  Followed up with peripheral clinic, intervention not deemed necessary.  Losartan started, HCTZ was stopped due to polyuria.  Overall doing okay, feels stressed this morning due to having a stillbirth years ago in the month of January.  Follows up with PCP today.   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   Qualifier: Diagnosis of  By: [place] LPN, Kim  Left mastectomy  In 2000     Personal history of malignant neoplasm of breast     Past Surgical History:  Procedure Laterality Date   ABDOMINAL HYSTERECTOMY  1973   fibroids   BREAST SURGERY Left 1998   breast ca   FLEXIBLE SIGMOIDOSCOPY N/A 05/25/2016   Procedure: FLEXIBLE SIGMOIDOSCOPY;  Surgeon: West Bali, MD;  Location: [facility];  Service: Endoscopy;  Laterality: N/A;  2:00 AM   MASTECTOMY  1997   left breast    TOTAL ABDOMINAL HYSTERECTOMY W/  BILATERAL SALPINGOOPHORECTOMY  1971   unilateral    VESICOVAGINAL FISTULA CLOSURE W/ TAH  1971    Current Medications: Current Meds  Medication Sig   amLODipine (NORVASC) 10 MG tablet Take 1 tablet (10 mg total) by mouth daily.   carvedilol (COREG) 6.25 MG tablet Take 1 tablet (6.25 mg total) by mouth 2 (two) times daily with a meal.   cholecalciferol (VITAMIN D) 400 UNITS TABS tablet Take 400 Units by mouth daily.   losartan (COZAAR) 100 MG tablet Take 1 tablet (100 mg total) by mouth daily.   PROAIR HFA 108 (90 Base) MCG/ACT inhaler INHALE TWO PUFFS INTO THE LUNGS EVERY SIX HOURS AS NEEDED FOR WHEEZING OR SHORTNESS OF BREATH   rosuvastatin (CRESTOR) 5 MG tablet Take 1 tablet (5 mg total) by mouth daily.     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  Tobacco Use   Smoking status: Former    Current packs/day: 0.00    Types: Cigarettes    Start date: 07/19/1958    Quit date:  07/19/1998    Years since quitting: 25.0   Smokeless tobacco: Never  Vaping Use   Vaping status: Never Used  Substance and Sexual Activity   Alcohol use: No   Drug use: No   Sexual activity: Not Currently  Other Topics Concern   Not on file  Social History Narrative   Not on file   Social Drivers of Health   Financial Resource Strain: Low Risk  (04/20/2023)   Overall Financial Resource Strain (CARDIA)    Difficulty of Paying Living Expenses: Not hard at all  Food Insecurity: No Food Insecurity (04/20/2023)   Hunger Vital Sign    Worried About Running Out of Food in the Last Year: Never true    Ran Out of Food in the Last Year: Never true  Transportation Needs: No Transportation Needs (04/20/2023)   PRAPARE - Scientist, research (physical sciences) (Medical): No    Lack of Transportation (Non-Medical): No  Physical Activity: Insufficiently Active (04/20/2023)   Exercise Vital Sign    Days of Exercise per Week: 3 days    Minutes of Exercise per Session: 20 min  Stress: No Stress Concern Present (04/20/2023)   [employer] of Occupational Health - Occupational Stress Questionnaire    Feeling of Stress : Not at all  Social Connections: Moderately Integrated (04/20/2023)   Social Connection and Isolation Panel [NHANES]    Frequency of Communication with Friends and Family: More than three times a week    Frequency of Social Gatherings with Friends and Family: More than three times a week    Attends Religious Services: More than 4 times per year    Active Member of [employer] or Organizations: No    Attends Engineer, structural: Never    Marital Status: Married     Family History: The patient's family history includes Breast cancer in her sister and sister; Cancer in her brother; Cervical cancer in her sister; Coronary artery disease in an other family member; Heart disease in her father; Kidney disease in her sister; Lung cancer in her sister; Parkinsonism in her mother; Prostate cancer in her father.  ROS:   Please see the history of present illness.     All other systems reviewed and are negative.  EKGs/Labs/Other Studies Reviewed:    The following studies were reviewed today:       Recent Labs: 01/25/2023: Hemoglobin 12.4; Platelets 326; TSH 4.470 05/03/2023: ALT 8; BUN 18; Creatinine, Ser 0.86; Potassium 4.1; Sodium 140  Recent Lipid Panel    Component Value Date/Time   CHOL 109 05/03/2023 0915   CHOL 127 01/25/2023 1000   TRIG 55 05/03/2023 0915   HDL 50 05/03/2023 0915   HDL 53 01/25/2023 1000   CHOLHDL 2.2 05/03/2023 0915   VLDL 11 05/03/2023 0915   LDLCALC 48 05/03/2023 0915   LDLCALC 60 01/25/2023 1000   LDLCALC 120 (H) 09/26/2019 0947     Risk Assessment/Calculations:           Physical Exam:    VS:  BP (!) 156/81 (BP Location: Left Arm, Patient Position: Sitting, Cuff Size: Large)   Pulse 79   Ht 5\' 4"  (1.626 m)   Wt 237 lb (107.5 kg)   SpO2 99%   BMI 40.68 kg/m     Wt Readings from Last 3 Encounters:  08/05/23 237 lb (107.5 kg)  08/03/23 237 lb (107.5 kg)  04/20/23 235 lb (106.6 kg)     GEN:  Well nourished, well developed  in no acute distress HEENT: Normal NECK: No JVD; No carotid bruits CARDIAC: RRR, no murmurs, rubs, gallops RESPIRATORY:  Clear to auscultation without rales, wheezing or rhonchi  ABDOMEN: Soft, non-tender, non-distended MUSCULOSKELETAL: Lymphedema noted on arms. SKIN: Warm and dry NEUROLOGIC:  Alert and oriented x 3 PSYCHIATRIC:  Normal affect   ASSESSMENT:    1. Primary hypertension   2. Hyperlipidemia, unspecified hyperlipidemia type   3. RAS (renal artery stenosis) (HCC)    PLAN:    In order of problems listed above:  Hypertension, BP elevated, stress/agitated today.  Low-salt diet advised.  Continue losartan 100 mg daily, Coreg 6.25 mg twice daily, Norvasc 10 mg daily. Hyperlipidemia, cholesterol controlled.  Continue Crestor 5 mg daily. Moderate left renal artery stenosis.  Not critical to require intervention.  Continue  Crestor.  Allergic to aspirin.  Follow-up in 6 months     Medication Adjustments/Labs and Tests Ordered: Current medicines are reviewed at length with the patient today.  Concerns regarding medicines are outlined above.  No orders of the defined types were placed in this encounter.  No orders of the defined types were placed in this encounter.   Patient Instructions  Medication Instructions:   Your physician recommends that you continue on your current medications as directed. Please refer to the Current Medication list given to you today.  *If you need a refill on your cardiac medications before your next appointment, please call your pharmacy*   Lab Work:  None Ordered  If you  have labs (blood work) drawn today and your tests are completely normal, you will receive your results only by: MyChart Message (if you have MyChart) OR A paper copy in the mail If you have any lab test that is abnormal or we need to change your treatment, we will call you to review the results.   Testing/Procedures:  None Ordered   Follow-Up: At [facility], you and your health needs are our priority.  As part of our continuing mission to provide you with exceptional heart care, we have created designated Provider Care Teams.  These Care Teams include your primary Cardiologist (physician) and Advanced Practice Providers (APPs -  Physician Assistants and Nurse Practitioners) who all work together to provide you with the care you need, when you need it.  We recommend signing up for the patient portal called "MyChart".  Sign up information is provided on this After Visit Summary.  MyChart is used to connect with patients for Virtual Visits (Telemedicine).  Patients are able to view lab/test results, encounter notes, upcoming appointments, etc.  Non-urgent messages can be sent to your provider as well.   To learn more about what you can do with MyChart, go to [email or web address].    Your next appointment:   6 month(s)  Provider:   You may see Debbe Odea, MD or one of the following Advanced Practice Providers on your designated Care Team:   Nicolasa Ducking, NP Eula Listen, PA-C Cadence Fransico Michael, PA-C Charlsie Quest, NP Carlos Levering, NP'    Signed, Debbe Odea, MD  08/05/2023 9:47 AM    [facility]

## 2023-08-05 NOTE — Patient Instructions (Signed)
 Medication Instructions:   Your physician recommends that you continue on your current medications as directed. Please refer to the Current Medication list given to you today.  *If you need a refill on your cardiac medications before your next appointment, please call your pharmacy*   Lab Work:  None Ordered  If you have labs (blood work) drawn today and your tests are completely normal, you will receive your results only by: MyChart Message (if you have MyChart) OR A paper copy in the mail If you have any lab test that is abnormal or we need to change your treatment, we will call you to review the results.   Testing/Procedures:  None Ordered   Follow-Up: At [facility], you and your health needs are our priority.  As part of our continuing mission to provide you with exceptional heart care, we have created designated Provider Care Teams.  These Care Teams include your primary Cardiologist (physician) and Advanced Practice Providers (APPs -  Physician Assistants and Nurse Practitioners) who all work together to provide you with the care you need, when you need it.  We recommend signing up for the patient portal called "MyChart".  Sign up information is provided on this After Visit Summary.  MyChart is used to connect with patients for Virtual Visits (Telemedicine).  Patients are able to view lab/test results, encounter notes, upcoming appointments, etc.  Non-urgent messages can be sent to your provider as well.   To learn more about what you can do with MyChart, go to [email or web address].    Your next appointment:   6 month(s)  Provider:   You may see Debbe Odea, MD or one of the following Advanced Practice Providers on your designated Care Team:   Nicolasa Ducking, NP Eula Listen, PA-C Cadence Fransico Michael, PA-C Charlsie Quest, NP Carlos Levering, NP

## 2023-08-05 NOTE — Patient Instructions (Addendum)
Annual  exam in July when due.Call if you need me sooner  BP is  good today, take meds as directed  Fasting labs today lipid, cmp and eGFr and hBA1C  Best for 2025!  Keep doing what you are doing  Weight loss goal of 6 to 12 pounds

## 2023-08-06 LAB — HEMOGLOBIN A1C
Est. average glucose Bld gHb Est-mCnc: 114 mg/dL
Hgb A1c MFr Bld: 5.6 % (ref 4.8–5.6)

## 2023-08-06 LAB — CMP14+EGFR
ALT: 7 [IU]/L (ref 0–32)
AST: 14 [IU]/L (ref 0–40)
Albumin: 4 g/dL (ref 3.7–4.7)
Alkaline Phosphatase: 107 [IU]/L (ref 44–121)
BUN/Creatinine Ratio: 15 (ref 12–28)
BUN: 12 mg/dL (ref 8–27)
Bilirubin Total: 0.4 mg/dL (ref 0.0–1.2)
CO2: 24 mmol/L (ref 20–29)
Calcium: 9.4 mg/dL (ref 8.7–10.3)
Chloride: 101 mmol/L (ref 96–106)
Creatinine, Ser: 0.82 mg/dL (ref 0.57–1.00)
Globulin, Total: 3.3 g/dL (ref 1.5–4.5)
Glucose: 90 mg/dL (ref 70–99)
Potassium: 4.5 mmol/L (ref 3.5–5.2)
Sodium: 140 mmol/L (ref 134–144)
Total Protein: 7.3 g/dL (ref 6.0–8.5)
eGFR: 71 mL/min/{1.73_m2} (ref >59)

## 2023-08-06 LAB — LIPID PANEL
Chol/HDL Ratio: 2.2 {ratio} (ref 0.0–4.4)
Cholesterol, Total: 116 mg/dL (ref 100–199)
HDL: 52 mg/dL (ref >39)
LDL Chol Calc (NIH): 50 mg/dL (ref 0–99)
Triglycerides: 65 mg/dL (ref 0–149)
VLDL Cholesterol Cal: 14 mg/dL (ref 5–40)

## 2023-09-01 ENCOUNTER — Encounter: Payer: Self-pay | Admitting: Family Medicine

## 2023-09-01 NOTE — Assessment & Plan Note (Signed)
Hyperlipidemia:Low fat diet discussed and encouraged.   Lipid Panel  Lab Results  Component Value Date   CHOL 116 08/05/2023   HDL 52 08/05/2023   LDLCALC 50 08/05/2023   TRIG 65 08/05/2023   CHOLHDL 2.2 08/05/2023     Controlled, no change in medication

## 2023-09-01 NOTE — Progress Notes (Signed)
Heather [name]     MRN: [id number]      DOB: 27-Sep-1940  Chief Complaint  Patient presents with   Follow-up    Follow  up    HPI Heather [name] is here for follow up and re-evaluation of chronic medical conditions, medication management and review of any available recent lab and radiology data.  Preventive health is updated, specifically  Cancer screening and Immunization.   Has been evaluated by cardiology and has upcoming follow up appointments The PT denies any adverse reactions to current medications since the last visit.  There are no new concerns.  There are no specific complaints   ROS Denies recent fever or chills. Denies sinus pressure, nasal congestion, ear pain or sore throat. Denies chest congestion, productive cough or wheezing. Denies chest pains, palpitations and leg swelling Denies abdominal pain, nausea, vomiting,diarrhea or constipation.   Denies dysuria, frequency, hesitancy or incontinence. Denies uncontrolled  joint pain, swelling and limitation in mobility. Denies headaches, seizures, numbness, or tingling. Denies depression, anxiety or insomnia. Denies skin break down or rash.   PE  BP 128/80   Pulse 81   Ht 5\' 4"  (1.626 m)   Wt 236 lb 1.9 oz (107.1 kg)   SpO2 92%   BMI 40.53 kg/m   Patient alert and oriented and in no cardiopulmonary distress.  HEENT: No facial asymmetry, EOMI,     Neck supple .  Chest: Clear to auscultation bilaterally.  CVS: S1, S2 no murmurs, no S3.Regular rate.  ABD: Soft non tender.   Ext: No edema  MS: Adequate ROM spine, shoulders, hips and knees.  Skin: Intact, no ulcerations or rash noted.  Psych: Good eye contact, normal affect. Memory intact not anxious or depressed appearing.  CNS: CN 2-12 intact, power,  normal throughout.no focal deficits noted.   Assessment & Plan  Resistant hypertension Controlled, no change in medication DASH diet and commitment to daily physical activity for a minimum of 30  minutes discussed and encouraged, as a part of hypertension management. The importance of attaining a healthy weight is also discussed.     08/05/2023   10:28 AM 08/05/2023    9:53 AM 08/05/2023    9:52 AM 08/05/2023    8:05 AM 08/03/2023    1:43 PM 04/20/2023   10:29 AM 04/09/2023    9:36 AM  BP/Weight  Systolic BP 128 146 153 156 140 -- 178  Diastolic BP 80 82 79 81 72 -- 562  Wt. (Lbs)   236.12 237 237 235   BMI   40.53 kg/m2 40.68 kg/m2 40.68 kg/m2 40.34 kg/m2        Dyslipidemia Hyperlipidemia:Low fat diet discussed and encouraged.   Lipid Panel  Lab Results  Component Value Date   CHOL 116 08/05/2023   HDL 52 08/05/2023   LDLCALC 50 08/05/2023   TRIG 65 08/05/2023   CHOLHDL 2.2 08/05/2023     Controlled, no change in medication   Prediabetes Patient educated about the importance of limiting  Carbohydrate intake , the need to commit to daily physical activity for a minimum of 30 minutes , and to commit weight loss. The fact that changes in all these areas will reduce or eliminate all together the development of diabetes is stressed.  Excellent , blood sugar within normal      Latest Ref Rng & Units 08/05/2023   10:45 AM 05/03/2023    9:15 AM 01/25/2023   10:00 AM 01/25/2023    9:59 AM 05/29/2022  9:26 AM  Diabetic Labs  HbA1c 4.8 - 5.6 % 5.6       Chol 100 - 199 mg/dL 161  096  045   409   HDL >39 mg/dL 52  50  53   50   Calc LDL 0 - 99 mg/dL 50  48  60   66   Triglycerides 0 - 149 mg/dL 65  55  67   65   Creatinine 0.57 - 1.00 mg/dL 8.11  9.14   7.82  9.56       08/05/2023   10:28 AM 08/05/2023    9:53 AM 08/05/2023    9:52 AM 08/05/2023    8:05 AM 08/03/2023    1:43 PM 04/20/2023   10:29 AM 04/09/2023    9:36 AM  BP/Weight  Systolic BP 128 146 153 156 140 -- 178  Diastolic BP 80 82 79 81 72 -- 213  Wt. (Lbs)   236.12 237 237 235   BMI   40.53 kg/m2 40.68 kg/m2 40.68 kg/m2 40.34 kg/m2        No data to display            MORBID  OBESITY  Patient re-educated about  the importance of commitment to a  minimum of 150 minutes of exercise per week as able.  The importance of healthy food choices with portion control discussed, as well as eating regularly and within a 12 hour window most days. The need to choose "clean , green" food 50 to 75% of the time is discussed, as well as to make water the primary drink and set a goal of 64 ounces water daily.       08/05/2023    9:52 AM 08/05/2023    8:05 AM 08/03/2023    1:43 PM  Weight /BMI  Weight 236 lb 1.9 oz 237 lb 237 lb  Height 5\' 4"  (1.626 m) 5\' 4"  (1.626 m) 5\' 4"  (1.626 m)  BMI 40.53 kg/m2 40.68 kg/m2 40.68 kg/m2    Uncghhnaaged, continue to work on lifestyle

## 2023-09-01 NOTE — Assessment & Plan Note (Signed)
  Patient re-educated about  the importance of commitment to a  minimum of 150 minutes of exercise per week as able.  The importance of healthy food choices with portion control discussed, as well as eating regularly and within a 12 hour window most days. The need to choose "clean , green" food 50 to 75% of the time is discussed, as well as to make water the primary drink and set a goal of 64 ounces water daily.       08/05/2023    9:52 AM 08/05/2023    8:05 AM 08/03/2023    1:43 PM  Weight /BMI  Weight 236 lb 1.9 oz 237 lb 237 lb  Height 5\' 4"  (1.626 m) 5\' 4"  (1.626 m) 5\' 4"  (1.626 m)  BMI 40.53 kg/m2 40.68 kg/m2 40.68 kg/m2    Uncghhnaaged, continue to work on lifestyle

## 2023-09-01 NOTE — Assessment & Plan Note (Signed)
Controlled, no change in medication DASH diet and commitment to daily physical activity for a minimum of 30 minutes discussed and encouraged, as a part of hypertension management. The importance of attaining a healthy weight is also discussed.     08/05/2023   10:28 AM 08/05/2023    9:53 AM 08/05/2023    9:52 AM 08/05/2023    8:05 AM 08/03/2023    1:43 PM 04/20/2023   10:29 AM 04/09/2023    9:36 AM  BP/Weight  Systolic BP 128 146 153 156 140 -- 178  Diastolic BP 80 82 79 81 72 -- 045  Wt. (Lbs)   236.12 237 237 235   BMI   40.53 kg/m2 40.68 kg/m2 40.68 kg/m2 40.34 kg/m2

## 2023-09-01 NOTE — Assessment & Plan Note (Signed)
Patient educated about the importance of limiting  Carbohydrate intake , the need to commit to daily physical activity for a minimum of 30 minutes , and to commit weight loss. The fact that changes in all these areas will reduce or eliminate all together the development of diabetes is stressed.  Excellent , blood sugar within normal      Latest Ref Rng & Units 08/05/2023   10:45 AM 05/03/2023    9:15 AM 01/25/2023   10:00 AM 01/25/2023    9:59 AM 05/29/2022    9:26 AM  Diabetic Labs  HbA1c 4.8 - 5.6 % 5.6       Chol 100 - 199 mg/dL 409  811  914   782   HDL >39 mg/dL 52  50  53   50   Calc LDL 0 - 99 mg/dL 50  48  60   66   Triglycerides 0 - 149 mg/dL 65  55  67   65   Creatinine 0.57 - 1.00 mg/dL 9.56  2.13   0.86  5.78       08/05/2023   10:28 AM 08/05/2023    9:53 AM 08/05/2023    9:52 AM 08/05/2023    8:05 AM 08/03/2023    1:43 PM 04/20/2023   10:29 AM 04/09/2023    9:36 AM  BP/Weight  Systolic BP 128 146 153 156 140 -- 178  Diastolic BP 80 82 79 81 72 -- 469  Wt. (Lbs)   236.12 237 237 235   BMI   40.53 kg/m2 40.68 kg/m2 40.68 kg/m2 40.34 kg/m2        No data to display

## 2024-02-02 ENCOUNTER — Encounter: Payer: Medicare Other | Admitting: Family Medicine

## 2024-02-09 ENCOUNTER — Encounter: Payer: Self-pay | Admitting: Family Medicine

## 2024-02-09 ENCOUNTER — Ambulatory Visit (INDEPENDENT_AMBULATORY_CARE_PROVIDER_SITE_OTHER): Payer: Self-pay | Admitting: Family Medicine

## 2024-02-09 VITALS — BP 160/90 | HR 83 | Resp 16 | Ht 64.0 in | Wt 230.1 lb

## 2024-02-09 DIAGNOSIS — E559 Vitamin D deficiency, unspecified: Secondary | ICD-10-CM

## 2024-02-09 DIAGNOSIS — I1A0 Resistant hypertension: Secondary | ICD-10-CM | POA: Diagnosis not present

## 2024-02-09 DIAGNOSIS — Z780 Asymptomatic menopausal state: Secondary | ICD-10-CM | POA: Diagnosis not present

## 2024-02-09 DIAGNOSIS — Z0001 Encounter for general adult medical examination with abnormal findings: Secondary | ICD-10-CM

## 2024-02-09 DIAGNOSIS — R7303 Prediabetes: Secondary | ICD-10-CM

## 2024-02-09 DIAGNOSIS — E785 Hyperlipidemia, unspecified: Secondary | ICD-10-CM

## 2024-02-09 MED ORDER — ROSUVASTATIN CALCIUM 5 MG PO TABS: 5.0000 mg | ORAL_TABLET | Freq: Every day | ORAL | 3 refills | Status: AC

## 2024-02-09 MED ORDER — HYDROCHLOROTHIAZIDE 12.5 MG PO CAPS: 12.5000 mg | ORAL_CAPSULE | Freq: Every day | ORAL | 1 refills | Status: DC

## 2024-02-09 NOTE — Progress Notes (Signed)
    Heather [name]     MRN: [id number]      DOB: [birth year]  Chief Complaint  Patient presents with   Annual Exam    CPE     HPI: Patient is in for annual physical exam. Uncontrolled hypertension is addressed Immunization is reviewed , and  updated if needed.   PE: BP (!) 160/90   Pulse 83   Resp 16   Ht 5' 4 (1.626 m)   Wt 230 lb 1.9 oz (104.4 kg)   SpO2 97%   BMI 39.50 kg/m   Pleasant  female, alert and oriented x 3, in no cardio-pulmonary distress. Afebrile. HEENT No facial trauma or asymetry. Sinuses non tender.  Extra occullar muscles intact.. External ears normal, . Neck: supple, no adenopathy,JVD or thyromegaly.No bruits.  Chest: Clear to ascultation bilaterally.No crackles or wheezes. Non tender to palpation  BCardiovascular system; Heart sounds normal,  S1 and  S2 ,no S3.  No murmur, or thrill. Apical beat not displaced Peripheral pulses normal.  Abdomen: Soft, non tender, no organomegaly or masses.  Musculoskeletal exam: Decreased though adequate l ROM of spine, hips , shoulders and knees. deformity ,swelling or crepitus noted.   Neurologic: Cranial nerves 2 to 12 intact. Power, tone ,sensation  normal throughout.  disturbance in gait. No tremor.  Skin: Intact, no ulceration, erythema , scaling or rash noted. Pigmentation normal throughout  Psych; Normal mood and affect. Judgement and concentration normal   Assessment & Plan:  Encounter for Medicare annual examination with abnormal findings Annual exam as documented. Counseling done  re healthy lifestyle involving commitment to 150 minutes exercise per week, heart healthy diet, and attaining healthy weight.The importance of adequate sleep also discussed. . Immunization and cancer screening needs are specifically addressed at this visit.   Resistant hypertension Add hydrochlorothiazide  12.5 mg daily re eval in 5 weeks DASH diet and commitment to daily physical activity for a minimum  of 30 minutes discussed and encouraged, as a part of hypertension management. The importance of attaining a healthy weight is also discussed.     02/09/2024    1:51 PM 02/09/2024    1:22 PM 08/05/2023   10:28 AM 08/05/2023    9:53 AM 08/05/2023    9:52 AM 08/05/2023    8:05 AM 08/03/2023    1:43 PM  BP/Weight  Systolic BP 160 150 128 146 153 156 140  Diastolic BP 90 81 80 82 79 81 72  Wt. (Lbs)  230.12   236.12 237 237  BMI  39.5 kg/m2   40.53 kg/m2 40.68 kg/m2 40.68 kg/m2       Post-menopause Dexa to be scheduled

## 2024-02-09 NOTE — Patient Instructions (Addendum)
 F/U in December  Fasting lipid, cmp and EGFR 3 to 5 days before December follow up   Nurse BP check in office with nurse in 4 weeks  New additional medication for blood pressure is hydrochlorothiazide  12.5 mg daily  CBC, bmp and Egfr , TSH and vit D today  Please schedule dexa scan at checkout  It is important that you exercise regularly at least 30 minutes 5 times a week. If you develop chest pain, have severe difficulty breathing, or feel very tired, stop exercising immediately and seek medical attention    Thanks for choosing [facility], we consider it a privelige to serve you.'

## 2024-02-15 ENCOUNTER — Ambulatory Visit (HOSPITAL_COMMUNITY)
Admission: RE | Admit: 2024-02-15 | Discharge: 2024-02-15 | Disposition: A | Discharge status: Home / Self Care | Source: Ambulatory Visit | Attending: Family Medicine | Admitting: Family Medicine

## 2024-02-15 DIAGNOSIS — Z780 Asymptomatic menopausal state: Secondary | ICD-10-CM | POA: Insufficient documentation

## 2024-02-15 DIAGNOSIS — R7303 Prediabetes: Secondary | ICD-10-CM | POA: Diagnosis not present

## 2024-02-15 DIAGNOSIS — E559 Vitamin D deficiency, unspecified: Secondary | ICD-10-CM | POA: Diagnosis not present

## 2024-02-15 DIAGNOSIS — I1A0 Resistant hypertension: Secondary | ICD-10-CM | POA: Diagnosis not present

## 2024-02-16 ENCOUNTER — Ambulatory Visit: Payer: Self-pay | Admitting: Family Medicine

## 2024-02-16 LAB — BMP8+EGFR
BUN/Creatinine Ratio: 15 (ref 12–28)
BUN: 14 mg/dL (ref 8–27)
CO2: 23 mmol/L (ref 20–29)
Calcium: 9.3 mg/dL (ref 8.7–10.3)
Chloride: 101 mmol/L (ref 96–106)
Creatinine, Ser: 0.93 mg/dL (ref 0.57–1.00)
Glucose: 93 mg/dL (ref 70–99)
Potassium: 4.2 mmol/L (ref 3.5–5.2)
Sodium: 140 mmol/L (ref 134–144)
eGFR: 61 mL/min/1.73 (ref >59)

## 2024-02-16 LAB — CBC WITH DIFFERENTIAL/PLATELET
Basophils Absolute: 0 x10E3/uL (ref 0.0–0.2)
Basos: 0 %
EOS (ABSOLUTE): 0.2 x10E3/uL (ref 0.0–0.4)
Eos: 2 %
Hematocrit: 39.6 % (ref 34.0–46.6)
Hemoglobin: 12.4 g/dL (ref 11.1–15.9)
Immature Grans (Abs): 0 x10E3/uL (ref 0.0–0.1)
Immature Granulocytes: 0 %
Lymphocytes Absolute: 3 x10E3/uL (ref 0.7–3.1)
Lymphs: 31 %
MCH: 30 pg (ref 26.6–33.0)
MCHC: 31.3 g/dL — ABNORMAL LOW (ref 31.5–35.7)
MCV: 96 fL (ref 79–97)
Monocytes Absolute: 0.6 x10E3/uL (ref 0.1–0.9)
Monocytes: 6 %
Neutrophils Absolute: 5.8 x10E3/uL (ref 1.4–7.0)
Neutrophils: 61 %
Platelets: 326 x10E3/uL (ref 150–450)
RBC: 4.14 x10E6/uL (ref 3.77–5.28)
RDW: 12.7 % (ref 11.7–15.4)
WBC: 9.6 x10E3/uL (ref 3.4–10.8)

## 2024-02-16 LAB — TSH: TSH: 4.01 u[IU]/mL (ref 0.450–4.500)

## 2024-02-16 LAB — VITAMIN D 25 HYDROXY (VIT D DEFICIENCY, FRACTURES): Vit D, 25-Hydroxy: 40.3 ng/mL (ref 30.0–100.0)

## 2024-02-17 DIAGNOSIS — H25813 Combined forms of age-related cataract, bilateral: Secondary | ICD-10-CM | POA: Diagnosis not present

## 2024-02-20 ENCOUNTER — Encounter: Payer: Self-pay | Admitting: Family Medicine

## 2024-02-20 DIAGNOSIS — Z780 Asymptomatic menopausal state: Secondary | ICD-10-CM | POA: Insufficient documentation

## 2024-02-20 NOTE — Assessment & Plan Note (Signed)
 Dexa to be scheduled

## 2024-02-20 NOTE — Assessment & Plan Note (Signed)
 Annual exam as documented. Counseling done  re healthy lifestyle involving commitment to 150 minutes exercise per week, heart healthy diet, and attaining healthy weight.The importance of adequate sleep also discussed.  Immunization and cancer screening needs are specifically addressed at this visit.

## 2024-02-20 NOTE — Assessment & Plan Note (Signed)
 Add hydrochlorothiazide  12.5 mg daily re eval in 5 weeks DASH diet and commitment to daily physical activity for a minimum of 30 minutes discussed and encouraged, as a part of hypertension management. The importance of attaining a healthy weight is also discussed.     02/09/2024    1:51 PM 02/09/2024    1:22 PM 08/05/2023   10:28 AM 08/05/2023    9:53 AM 08/05/2023    9:52 AM 08/05/2023    8:05 AM 08/03/2023    1:43 PM  BP/Weight  Systolic BP 160 150 128 146 153 156 140  Diastolic BP 90 81 80 82 79 81 72  Wt. (Lbs)  230.12   236.12 237 237  BMI  39.5 kg/m2   40.53 kg/m2 40.68 kg/m2 40.68 kg/m2

## 2024-03-05 NOTE — Progress Notes (Deleted)
 Cardiology Clinic Note   Date: 03/05/2024 ID: Anielle, Headrick 08/04/1940, MRN [id number]  Primary Cardiologist:  Redell Cave, MD  Chief Complaint   Heather [name] is a 83 y.o. female who presents to the clinic today for ***  Patient Profile   Heather [name] is followed by Dr. Cave for the history outlined below.      Past medical history significant for: Hypertension. Renal artery stenosis/SMA stenosis. Renal artery ultrasound 05/06/2023: Normal right size kidney with normal resistive index, cortical thickness, and no evidence of right renal artery stenosis.  Left renal artery with 1 to 59% stenosis.  Normal celiac and inferior mesenteric artery.  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 Hyperlipidemia. Lipid panel 08/05/2023: LDL 50, HDL 52, TG 65, total 160. Asthma. Left breast cancer. S/p mastectomy 1997.  In summary, patient was first evaluated by Dr. Cave on 04/09/2023 for resistant hypertension and history of heart failure.  She reported a several year history of hypertension.  SBP at home typically in the 170s.  She reported occasional salty foods.  She complained of polyuria on HCTZ.  She was instructed to stop HCTZ and start losartan  100 mg daily, carvedilol  6.25 mg twice daily and continue amlodipine  10 mg daily.  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  She was referred to Dr. Darron for further evaluation.  She saw Dr. Cloria on 08/03/2023 her blood pressure had improved on new regimen.  Incidental finding of SMA stenosis discussed.  She denied abdominal pain.  No further workup needed.  Renal artery intervention not clinically indicated at that time.  Patient was last seen in the office by Dr. Cave on 08/05/2023 for routine follow-up.  She reported being under more stress.  BP elevated.  No medication changes made.  Patient was seen  by PCP on 02/09/2024 for annual physical.  Her BP was elevated at 160/90 at the time of her visit.  She reported she was not taking carvedilol .  HCTZ was added.     History of Present Illness    Today, patient ***  Hypertension/renal artery stenosis Renal artery ultrasound October 2024 showed 1 to 59% stenosis left renal artery.  BP today***.  Patient*** - Continue amlodipine , losartan , HCTZ.  SMA stenosis Renal artery ultrasound October 2024 demonstrated 70 to 99% stenosis SMA.  Patient denies abdominal pain*** - Continue to follow with Dr. Darron.  Hyperlipidemia LDL 50 January 2025, at goal. - Continue rosuvastatin .  ROS: All other systems reviewed and are otherwise negative except as noted in History of Present Illness.  EKGs/Labs Reviewed        08/05/2023: ALT 7; AST 14 02/15/2024: BUN 14; Creatinine, Ser 0.93; Potassium 4.2; Sodium 140   02/15/2024: Hemoglobin 12.4; WBC 9.6   02/15/2024: TSH 4.010   No results found for requested labs within last 365 days.  ***  Risk Assessment/Calculations    {Does this patient have ATRIAL FIBRILLATION?:[id number]} No BP recorded.  {Refresh Note OR Click here to enter BP  :1}***        Physical Exam    VS:  There were no vitals taken for this visit. , BMI There is no height or weight on file to calculate BMI.  GEN: Well nourished, well developed, in no acute distress. Neck: No JVD or carotid bruits. Cardiac: *** RRR. *** No murmur. No rubs or gallops.   Respiratory:  Respirations  regular and unlabored. Clear to auscultation without rales, wheezing or rhonchi. GI: Soft, nontender, nondistended. Extremities: Radials/DP/PT 2+ and equal bilaterally. No clubbing or cyanosis. No edema ***  Skin: Warm and dry, no rash. Neuro: Strength intact.  Assessment & Plan   ***  Disposition: ***     {Are you ordering a CV Procedure (e.g. stress test, cath, DCCV, TEE, etc)?   Press F2        :[id number]}   Signed, Barnie HERO. Marit Goodwill,  DNP, NP-C

## 2024-03-08 ENCOUNTER — Ambulatory Visit

## 2024-03-09 ENCOUNTER — Ambulatory Visit: Admitting: Student

## 2024-03-29 NOTE — Progress Notes (Unsigned)
 Cardiology Clinic Note   Date: 03/31/2024 ID: Malini, Flemings 05-19-1941, MRN [id number]  Primary Cardiologist:  Redell Cave, MD  Chief Complaint   Heather [name] is a 83 y.o. female who presents to the clinic today for routine follow up.   Patient Profile   Heather [name] is followed by Dr. Cave for the history outlined below.      Past medical history significant for: Hypertension. Renal artery stenosis/SMA stenosis. Renal artery ultrasound 05/06/2023: Normal right size kidney with normal resistive index, cortical thickness, and no evidence of right renal artery stenosis.  Left renal artery with 1 to 59% stenosis.  Normal celiac and inferior mesenteric artery.  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 Hyperlipidemia. Lipid panel 08/05/2023: LDL 50, HDL 52, TG 65, total 160. Asthma. Left breast cancer. S/p mastectomy 1997.  In summary, patient was first evaluated by Dr. Cave on 04/09/2023 for resistant hypertension and history of heart failure.  She reported a several year history of hypertension.  SBP at home typically in the 170s.  She reported occasional salty foods.  She complained of polyuria on HCTZ.  She was instructed to stop HCTZ and start losartan  100 mg daily, carvedilol  6.25 mg twice daily and continue amlodipine  10 mg daily.  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  She was referred to Dr. Darron for further evaluation.  She saw Dr. Cloria on 08/03/2023 her blood pressure had improved on new regimen.  Incidental finding of SMA stenosis discussed.  She denied abdominal pain.  No further workup needed.  Renal artery intervention not clinically indicated at that time.  Patient was last seen in the office by Dr. Cave on 08/05/2023 for routine follow-up.  She reported being under more stress.  BP elevated.  No medication changes  made.  Patient was seen by PCP on 02/09/2024 for annual physical.  Her BP was elevated at 160/90 at the time of her visit.  She reported she was not taking carvedilol .  HCTZ was added.     History of Present Illness    Today, patient reports she is doing well. Patient denies shortness of breath, dyspnea on exertion, orthopnea or PND. She reports mild edema in bilateral ankles. No chest pain, pressure, or tightness. No palpitations.  She reports she stopped taking carvedilol  soon after starting it, as it made her feel poorly. I discussed that it appeared on her medications list for several visits but she insists she stopped it back in May 2024. She reports hydrochlorothiazide  was added in July by PCP. She has not been checking BP at home because she loaned her BP cuff out. She is planning on getting it back so she can start monitoring BP at home. She reports being under an increased amount of stress secondary to some health concerns for her son. She reports she is active throughout the day performing light to moderate household activities.     ROS: All other systems reviewed and are otherwise negative except as noted in History of Present Illness.  EKGs/Labs Reviewed    EKG Interpretation Date/Time:  Friday March 31 2024 13:49:24 EDT Ventricular Rate:  80 PR Interval:  158 QRS Duration:  78 QT Interval:  360 QTC Calculation: 415 R Axis:   -25  Text Interpretation: Normal sinus rhythm with sinus arrhythmia Normal ECG When compared with ECG of 09-Apr-2023 09:33, No significant change was  found Confirmed by Loistine Sober [id number]) on 03/31/2024 1:51:27 PM   08/05/2023: ALT 7; AST 14 02/15/2024: BUN 14; Creatinine, Ser 0.93; Potassium 4.2; Sodium 140   02/15/2024: Hemoglobin 12.4; WBC 9.6   02/15/2024: TSH 4.010   Risk Assessment/Calculations      HYPERTENSION CONTROL Vitals:   03/31/24 1342 03/31/24 1636  BP: (!) 152/92 (!) 148/82    The patient's blood pressure is elevated  above target today.  In order to address the patient's elevated BP: A current anti-hypertensive medication was adjusted today.           Physical Exam    VS:  BP (!) 148/82 (BP Location: Other (Comment), Patient Position: Sitting, Cuff Size: Normal) Comment (BP Location): left forearm  Pulse 80   Ht 5' 4 (1.626 m)   Wt 232 lb (105.2 kg)   SpO2 96%   BMI 39.82 kg/m  , BMI Body mass index is 39.82 kg/m.  GEN: Well nourished, well developed, in no acute distress. Neck: No JVD or carotid bruits. Cardiac:  RRR.  No murmur. No rubs or gallops.   Respiratory:  Respirations regular and unlabored. Clear to auscultation without rales, wheezing or rhonchi. GI: Soft, nontender, nondistended. Extremities: Radials/DP/PT 2+ and equal bilaterally. No clubbing or cyanosis. Mild nonpitting edema bilateral ankles.  Skin: Warm and dry, no rash. Neuro: Strength intact.  Assessment & Plan   Hypertension/renal artery stenosis Renal artery ultrasound October 2024 showed 1 to 59% stenosis left renal artery.  BP today 152/92 on intake and 148/82 on my recheck. Patient reports carvedilol  made her feel poorly. She denies headaches or dizziness.  - Continue amlodipine , losartan . - Increase hydrochlorothiazide  to 25 mg daily.  - BMP in 2 weeks. Bring BP log (provided) at that time.   SMA stenosis Renal artery ultrasound October 2024 demonstrated 70 to 99% stenosis SMA.  Patient denies abdominal pain. - Continue to follow with Dr. Darron.  Hyperlipidemia LDL 50 January 2025, at goal. - Continue rosuvastatin .  Disposition: Increase hydrochlorothiazide  to 25 mg daily. BMP in 2 weeks. Bring BP log at that time. Return in 6 months or sooner as needed.          Signed, Sober HERO. Dezaray Shibuya, DNP, NP-C

## 2024-03-31 ENCOUNTER — Ambulatory Visit: Attending: Student | Admitting: Student

## 2024-03-31 ENCOUNTER — Encounter: Payer: Self-pay | Admitting: Student

## 2024-03-31 VITALS — BP 148/82 | HR 80 | Ht 64.0 in | Wt 232.0 lb

## 2024-03-31 DIAGNOSIS — I701 Atherosclerosis of renal artery: Secondary | ICD-10-CM

## 2024-03-31 DIAGNOSIS — E7800 Pure hypercholesterolemia, unspecified: Secondary | ICD-10-CM

## 2024-03-31 DIAGNOSIS — I10 Essential (primary) hypertension: Secondary | ICD-10-CM

## 2024-03-31 DIAGNOSIS — I6381 Other cerebral infarction due to occlusion or stenosis of small artery: Secondary | ICD-10-CM | POA: Diagnosis not present

## 2024-03-31 MED ORDER — HYDROCHLOROTHIAZIDE 25 MG PO TABS: 25.0000 mg | ORAL_TABLET | Freq: Every day | ORAL | Status: AC

## 2024-03-31 NOTE — Patient Instructions (Signed)
 Medication Instructions:  Your physician recommends the following medication changes.  INCREASE: Hydrochlorothiazide  to 25 mg once daily.  Please let us  know when you run out of your current supply of medication and we will send in the new prescription to your pharmacy.   *If you need a refill on your cardiac medications before your next appointment, please call your pharmacy*  Lab Work: Your provider would like for you to return in 2 Weeks to have the following labs drawn: BMet.   Please go to [facility] [address] ([facility] [address], [place] [address] You do not need an appointment.  They are open from 8 am- 4:30 pm.  Lunch from 1:00 pm- 2:00 pm You Do Not need to be fasting.   You may also go to one of the following [facility]:  [address] [place], [place] [address] Phone: [phone] Lab hours: Mon-Fri 8 am- 5 pm    Lunch 12 pm- 1 pm  [address] [place],  [place]  [address]  [place]  Phone: [phone] Lab hours: 7 am- 4 pm Lunch 12 pm-1 pm   [address] [place],  [place]  [address]  [place]  Phone: [phone] Lab hours: Mon-Fri 8 am- 5 pm    Lunch 12 pm- 1 pm  If you have labs (blood work) drawn today and your tests are completely normal, you will receive your results only by: MyChart Message (if you have MyChart) OR A paper copy in the mail If you have any lab test that is abnormal or we need to change your treatment, we will call you to review the results.  Testing/Procedures: None ordered at this time   Follow-Up: At [facility], you and your health needs are our priority.  As part of our continuing mission to provide you with exceptional heart care, our providers are all part of one team.  This team includes your primary Cardiologist (physician) and Advanced Practice Providers or APPs (Physician Assistants and Nurse Practitioners) who all work together to provide you with the care you need, when you need it.  Your next  appointment:   6 month(s)  Provider:   Redell Cave, MD or Barnie Hila, NP    We recommend signing up for the patient portal called MyChart.  Sign up information is provided on this After Visit Summary.  MyChart is used to connect with patients for Virtual Visits (Telemedicine).  Patients are able to view lab/test results, encounter notes, upcoming appointments, etc.  Non-urgent messages can be sent to your provider as well.   To learn more about what you can do with MyChart, go to [email or web address].   Other Instructions  Avoid alcohol, caffeine, and exercise within 30 minutes before checking blood pressure. Sit still, with feet flat on the floor, in a straight backed, supportive chair.  Rest for 5 minutes.  Support arm on flat surface at heart level. Bottom of cuff should be placed directly above the bend of the elbow. Take multiple readings.  Write down and bring to all follow up appointments.  Bring cuff to clinic periodically for accuracy comparison.

## 2024-04-04 ENCOUNTER — Ambulatory Visit: Payer: Medicare Other

## 2024-04-04 VITALS — BP 154/84 | Ht 64.0 in | Wt 229.0 lb

## 2024-04-04 DIAGNOSIS — Z1231 Encounter for screening mammogram for malignant neoplasm of breast: Secondary | ICD-10-CM

## 2024-04-04 DIAGNOSIS — Z0000 Encounter for general adult medical examination without abnormal findings: Secondary | ICD-10-CM | POA: Diagnosis not present

## 2024-04-04 NOTE — Patient Instructions (Signed)
 [name],  Thank you for taking the time for your Medicare Wellness Visit. I appreciate your continued commitment to your health goals. Please review the care plan we discussed, and feel free to reach out if I can assist you further.    Ongoing Care Seeing your primary care provider every 3 to 6 months helps us  monitor your health and provide consistent, personalized care.     Referrals  If a referral was made during today's visit and you haven't received any updates within two weeks, please contact the referred provider directly to check on the status.  To Schedule Your Mammogram, Call:  [facility] @  [phone]   Wishing you good health and many blessings for the year to come  -Heather [name]    Medicare recommends these wellness visits once per year to help you and your care team stay ahead of potential health issues. These visits are designed to focus on prevention, allowing your provider to concentrate on managing your acute and chronic conditions during your regular appointments.     Please note that Annual Wellness Visits do not include a physical exam. Some assessments may be limited, especially if the visit was conducted virtually. If needed, we may recommend a separate in-person follow-up with your provider.  Recommended Screenings:  Health Maintenance  Topic Date Due   Zoster (Shingles) Vaccine (1 of 2) Never done   DTaP/Tdap/Td vaccine (2 - Tdap) 02/13/2017   COVID-19 Vaccine (3 - Moderna risk series) 11/22/2019   Breast Cancer Screening  11/16/2023   Flu Shot  02/18/2024   Medicare Annual Wellness Visit  04/04/2025   DEXA scan (bone density measurement)  02/14/2029   Pneumococcal Vaccine for age over 59  Completed   HPV Vaccine  Aged Out   Meningitis B Vaccine  Aged Out   Hepatitis C Screening  Discontinued       04/04/2024    1:03 PM  Advanced Directives  Does Patient Have a Medical Advance Directive? No  Would patient like information on creating a  medical advance directive? No - Patient declined    Advance Care Planning is important because it: Ensures you receive medical care that aligns with your values, goals, and preferences. Provides guidance to your family and loved ones, reducing the emotional burden of decision-making during critical moments.    Vision: Annual vision screenings are recommended for early detection of glaucoma, cataracts, and diabetic retinopathy. These exams can also reveal signs of chronic conditions such as diabetes and high blood pressure.    Dental: Annual dental screenings help detect early signs of oral cancer, gum disease, and other conditions linked to overall health, including heart disease and diabetes.  Please see the attached documents for additional preventive care recommendations.

## 2024-04-04 NOTE — Progress Notes (Signed)
 Please attest and cosign this visit due to patients primary care provider not being immediately available at the time the visit was completed.   Subjective:   Heather [name] is a 83 y.o. who presents for a Medicare Wellness preventive visit. As a reminder, Annual Wellness Visits don't include a physical exam, and some assessments may be limited, especially if this visit is performed virtually. We may recommend an in-person follow-up visit with your provider if needed.  Visit Complete: Virtual I connected with  Heather [name] on 04/04/24 by a audio enabled telemedicine application and verified that I am speaking with the correct person using two identifiers.  Patient Location: Home  Provider Location: Home Office  I discussed the limitations of evaluation and management by telemedicine. The patient expressed understanding and agreed to proceed.  Vital Signs: Because this visit was a virtual/telehealth visit, some criteria may be missing or patient reported. Any vitals not documented were not able to be obtained and vitals that have been documented are patient reported. VideoError- Librarian, academic were attempted between this provider and patient, however failed, due to patient having technical difficulties OR patient did not have access to video capability.  We continued and completed visit with audio only.   Persons Participating in Visit: Patient.  AWV Questionnaire: No: Patient Medicare AWV questionnaire was not completed prior to this visit. Cardiac Risk Factors include: advanced age (>27men, >38 women);dyslipidemia;hypertension;obesity (BMI >30kg/m2);sedentary lifestyle;Other (see comment), Risk factor comments: history of heart failure     Objective:    Today's Vitals   04/04/24 1247  BP: (!) 154/84  Weight: 229 lb (103.9 kg)  Height: 5' 4 (1.626 m)   Body mass index is 39.31 kg/m.    04/04/2024    1:03 PM 04/20/2023   10:27 AM 12/11/2021     1:44 PM 03/04/2021    3:15 PM 12/05/2018    2:01 PM 02/17/2018   10:26 AM 11/17/2017    1:36 PM  Advanced Directives  Does Patient Have a Medical Advance Directive? No No No Yes Yes Yes  Yes   Type of Advance Directive    Living will Living will;Healthcare Power of [employer] Power of [place];Living will  Does patient want to make changes to medical advance directive?     No - Patient declined  Yes (ED - Information included in AVS)  No - Patient declined   Copy of Healthcare Power of Attorney in Chart?     No - copy requested   No - copy requested   Would patient like information on creating a medical advance directive? No - Patient declined Yes (MAU/Ambulatory/Procedural Areas - Information given) No - Patient declined         Data saved with a previous flowsheet row definition    Current Medications (verified) Outpatient Encounter Medications as of 04/04/2024  Medication Sig   amLODipine  (NORVASC ) 10 MG tablet Take 1 tablet (10 mg total) by mouth daily.   cholecalciferol (VITAMIN D ) 400 UNITS TABS tablet Take 400 Units by mouth daily.   hydrochlorothiazide  (HYDRODIURIL ) 25 MG tablet Take 1 tablet (25 mg total) by mouth daily.   losartan  (COZAAR ) 100 MG tablet Take 1 tablet (100 mg total) by mouth daily.   PROAIR  HFA 108 (90 Base) MCG/ACT inhaler INHALE TWO PUFFS INTO THE LUNGS EVERY SIX HOURS AS NEEDED FOR WHEEZING OR SHORTNESS OF BREATH   rosuvastatin  (CRESTOR ) 5 MG tablet Take 1 tablet (5 mg total) by mouth daily.  No facility-administered encounter medications on file as of 04/04/2024.    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   Qualifier: Diagnosis of  By: [place] LPN, Kim  Left mastectomy  In 2000     Personal history of malignant neoplasm of breast    Past Surgical History:  Procedure Laterality Date   ABDOMINAL HYSTERECTOMY  1973   fibroids   BREAST SURGERY Left 1998   breast ca   FLEXIBLE SIGMOIDOSCOPY N/A 05/25/2016   Procedure: FLEXIBLE SIGMOIDOSCOPY;  Surgeon: Margo LITTIE Haddock, MD;  Location: [facility];  Service: Endoscopy;  Laterality: N/A;  2:00 AM   MASTECTOMY  1997   left breast    TOTAL ABDOMINAL HYSTERECTOMY W/ BILATERAL SALPINGOOPHORECTOMY  1971   unilateral    VESICOVAGINAL FISTULA CLOSURE W/ TAH  1971   Family History  Problem Relation Age of Onset   Parkinsonism Mother    Heart disease Father    Prostate cancer Father    Kidney disease Sister        nephrectomy   Breast cancer Sister    Lung cancer Sister    Breast cancer Sister    Cervical cancer Sister    Cancer Brother        throat   Coronary artery disease Other        Family History of males    Social History   Socioeconomic History   Marital status: Widowed    Spouse name: Not on file   Number of children: 6   Years of education: Not on file   Highest education level: Some college, no degree  Occupational History   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  Tobacco Use   Smoking status: Former    Current packs/day: 0.00    Types: Cigarettes    Start date: 07/19/1958    Quit date: 07/19/1998    Years since quitting: 25.7   Smokeless tobacco: Never  Vaping Use   Vaping status: Never Used  Substance and Sexual Activity   Alcohol use: No   Drug use: No   Sexual activity: Not Currently  Other Topics Concern   Not on file  Social History Narrative   Not on file   Social Drivers of Health   Financial Resource Strain: Low Risk  (04/04/2024)   Overall Financial Resource Strain (CARDIA)    Difficulty of Paying Living  Expenses: Not hard at all  Food Insecurity: No Food Insecurity (04/04/2024)   Hunger Vital Sign    Worried About Running Out of Food in the Last Year: Never true    Ran Out of Food in the Last Year: Never true  Transportation Needs: No Transportation Needs (04/04/2024)   PRAPARE - Administrator, Civil Service (Medical): No    Lack of Transportation (Non-Medical): No  Physical Activity: Inactive (04/04/2024)   Exercise Vital Sign    Days of Exercise per Week: 0 days    Minutes of Exercise per Session: 0 min  Stress:  Stress Concern Present (04/04/2024)   [employer] of Occupational Health - Occupational Stress Questionnaire    Feeling of Stress: Rather much  Social Connections: Socially Integrated (04/04/2024)   Social Connection and Isolation Panel    Frequency of Communication with Friends and Family: More than three times a week    Frequency of Social Gatherings with Friends and Family: More than three times a week    Attends Religious Services: More than 4 times per year    Active Member of [employer] or Organizations: Yes    Attends Engineer, structural: More than 4 times per year    Marital Status: Married    Tobacco Counseling Counseling given: Yes   Clinical Intake: Pre-visit preparation completed: Yes Pain : No/denies pain   BMI - recorded: 39.31 Nutritional Status: BMI > 30  Obese Nutritional Risks: None Diabetes: No Lab Results  Component Value Date   HGBA1C 5.6 08/05/2023   HGBA1C 5.6 11/19/2021   HGBA1C 5.6 01/07/2021    How often do you need to have someone help you when you read instructions, pamphlets, or other written materials from your doctor or pharmacy?: 1 - Never Interpreter Needed?: No Information entered by :: Jevante Hollibaugh W CMA (AAMA)  Activities of Daily Living     04/04/2024    1:06 PM 04/20/2023   10:39 AM  In your present state of health, do you have any difficulty performing the following activities:  Hearing? 0 0  Vision? 0 0   Difficulty concentrating or making decisions? 0 0  Walking or climbing stairs? 0 0  Dressing or bathing? 0 0  Doing errands, shopping? 0 0  Preparing Food and eating ? N N  Using the Toilet? N N  In the past six months, have you accidently leaked urine? N N  Do you have problems with loss of bowel control? N N  Managing your Medications? N N  Managing your Finances? N N  Housekeeping or managing your Housekeeping? N N   Patient Care Team: Antonetta Rollene BRAVO, MD as PCP - General Darliss Rogue, MD as PCP - Cardiology ([facility], [facility]   I have updated your Care Teams any recent Medical Services you may have received from other providers in the past year.     Assessment:   This is a routine wellness examination for [facility].  Hearing/Vision screen Hearing Screening - Comments:: Patient denies any hearing difficulties.   Vision Screening - Comments:: Wears rx glasses - up to date with routine eye exams with  [facility]  Goals Addressed               This Visit's Progress     COMPLETED: Patient Stated (pt-stated)        To be healthy        Depression Screen     04/04/2024   12:52 PM 02/09/2024    1:23 PM 08/05/2023    9:53 AM 04/20/2023   10:32 AM 03/26/2023    9:20 AM 01/29/2023    9:10 AM 08/07/2022    9:32 AM  PHQ 2/9 Scores  PHQ - 2 Score 0 0 0 0 0 0 0  PHQ- 9 Score 0   0 0  0     Fall Risk     04/04/2024   12:54 PM 02/09/2024    1:23 PM 08/05/2023    9:53 AM 04/20/2023   10:38 AM 03/26/2023    9:20 AM  Fall Risk  Falls in the past year? 1 1 0 0 0  Number falls in past yr: 0 0 0 0   Injury with Fall? 0 0 0 0   Risk for fall due to :   No Fall Risks No Fall Risks   Follow up Falls prevention discussed;Education provided;Falls evaluation completed Falls evaluation completed Falls evaluation completed Falls prevention discussed     MEDICARE RISK AT HOME:  Medicare Risk at Home Any stairs in or around the home?: Yes If  so, are there any without handrails?: No Home free of loose throw rugs in walkways, pet beds, electrical cords, etc?: Yes Adequate lighting in your home to reduce risk of falls?: Yes Life alert?: No Use of a cane, walker or w/c?: No Grab bars in the bathroom?: No Shower chair or bench in shower?: No Elevated toilet seat or a handicapped toilet?: Yes  TIMED UP AND GO: Was the test performed?  No  Cognitive Function: 6CIT completed        04/04/2024    1:06 PM 04/20/2023   10:31 AM 03/04/2021    3:17 PM 12/25/2019    8:34 AM 02/17/2018   10:36 AM  6CIT Screen  What Year? 0 points 0 points 0 points  0 points  What month? 0 points 0 points 0 points  0 points  What time? 0 points 0 points 0 points 0 points 0 points  Count back from 20 0 points 0 points 0 points 0 points 0 points  Months in reverse 0 points 0 points 0 points 0 points 0 points  Repeat phrase 0 points 0 points 0 points 0 points 0 points  Total Score 0 points 0 points 0 points  0 points    Immunizations Immunization History  Administered Date(s) Administered   Fluad Quad(high Dose 65+) 05/23/2019   Fluad Trivalent(High Dose 65+) 03/26/2023   INFLUENZA, HIGH DOSE SEASONAL PF 08/09/2018   Influenza Whole 05/06/2006, 05/25/2007, 05/06/2009, 03/27/2010   Influenza,inj,Quad PF,6+ Mos 04/12/2013, 05/30/2014, 05/09/2015, 05/28/2016   Moderna Sars-Covid-2 Vaccination 09/27/2019, 10/25/2019   Pneumococcal Conjugate-13 11/11/2017   Pneumococcal Polysaccharide-23 02/14/2007, 01/30/2016   Td 02/14/2007    Screening Tests Health Maintenance  Topic Date Due   Zoster Vaccines- Shingrix (1 of 2) Never done   DTaP/Tdap/Td (2 - Tdap) 02/13/2017   COVID-19 Vaccine (3 - Moderna risk series) 11/22/2019   Mammogram  11/16/2023   Influenza Vaccine  02/18/2024   Medicare Annual Wellness (AWV)  04/04/2025   DEXA SCAN  02/14/2029   Pneumococcal Vaccine: 50+ Years  Completed   HPV VACCINES  Aged Out   Meningococcal B Vaccine  Aged Out    Hepatitis C Screening  Discontinued    Health Maintenance Health Maintenance Due  Topic Date Due   Zoster Vaccines- Shingrix (1 of 2) Never done   DTaP/Tdap/Td (2 - Tdap) 02/13/2017   COVID-19 Vaccine (3 - Moderna risk series) 11/22/2019   Mammogram  11/16/2023   Influenza Vaccine  02/18/2024   Health Maintenance Items Addressed: Mammogram ordered  Additional Screening: Vision Screening: Recommended annual ophthalmology exams for early detection of glaucoma and other disorders of the eye. Would you like a referral to an eye doctor? No    Dental Screening: Recommended annual dental exams for proper oral hygiene  Community Resource Referral / Chronic Care Management: CRR required this visit?  No   CCM required this visit?  No  Plan:   I have personally reviewed and noted the following in the patient's chart:  Medical and social history Use of alcohol, tobacco or illicit drugs  Current medications and supplements including opioid prescriptions. Patient is not currently taking opioid prescriptions. Functional ability and status Nutritional status Physical activity Advanced directives List of other physicians Hospitalizations, surgeries, and ER visits in previous 12 months Vitals Screenings to include cognitive, depression, and falls Referrals and appointments  In addition, I have reviewed and discussed with patient certain preventive protocols, quality metrics, and best practice recommendations. A written personalized care plan for preventive services as well as general preventive health recommendations were provided to patient.   Rushton Early, CMA   04/04/2024   After Visit Summary: (Mail) Due to this being a telephonic visit, the after visit summary with patients personalized plan was offered to patient via mail   Notes: Nothing significant to report at this time.

## 2024-04-17 ENCOUNTER — Telehealth: Payer: Self-pay | Admitting: Cardiology

## 2024-04-17 NOTE — Telephone Encounter (Signed)
 Patient family member came by office  Dropped off blood pressure log Placed in nurse box

## 2024-04-21 ENCOUNTER — Telehealth: Payer: Self-pay | Admitting: Emergency Medicine

## 2024-04-21 MED ORDER — CARVEDILOL 3.125 MG PO TABS: 3.1250 mg | ORAL_TABLET | Freq: Two times a day (BID) | ORAL | 3 refills | Status: AC

## 2024-04-21 NOTE — Telephone Encounter (Signed)
-----   Message from Barnie Hila sent at 04/21/2024 12:53 PM EDT ----- Abby,   Will you please contact this patient and let her know I reviewed her BP log. Her blood pressure is still not controlled. I would like to try her on carvedilol  3.125 mg bid. She has been on this before but at a higher dose. I would like to see if she can tolerate a lower dose. I would like her to call in or bring in 2 weeks of daily BP readings. If she cannot tolerate carvedilol  she is to contact the office.   Thank you! DW

## 2024-04-21 NOTE — Telephone Encounter (Signed)
 Called and spoke with the patient regarding recommendation from Barnie Hila, NP about the patient's blood pressure not being well controlled and wanting the patient to start Carvedilol  3.125 mg BID.  Patient agreeable to this treatment plan.  Patient advised to call back and let us  know if she cannot tolerate the medication.  Patient also advised to continue keeping blood pressure log and report back in about 2 weeks.  Patient agreeable to all recommendations.  Prescription for medication sent to patient's pharmacy [facility]) for 3 month supply with 3 refills.  All questions and concerns addressed at this time and patient verbalized understanding of all instructions and recommendations.

## 2024-04-27 ENCOUNTER — Other Ambulatory Visit: Payer: Self-pay | Admitting: Family Medicine

## 2024-04-27 ENCOUNTER — Other Ambulatory Visit: Payer: Self-pay | Admitting: Cardiology

## 2024-07-11 ENCOUNTER — Ambulatory Visit: Admitting: Family Medicine

## 2024-08-23 ENCOUNTER — Ambulatory Visit: Admitting: Family Medicine

## 2025-04-09 ENCOUNTER — Ambulatory Visit
